# Patient Record
Sex: Male | Born: 1955 | Race: White | Hispanic: No | Marital: Married | Smoking: Never smoker
Health system: Southern US, Community
[De-identification: ages and names within clinical notes are randomized; demographics above are authoritative.]

## PROBLEM LIST (undated history)

## (undated) DIAGNOSIS — I2510 Atherosclerotic heart disease of native coronary artery without angina pectoris: Secondary | ICD-10-CM

## (undated) DIAGNOSIS — I10 Essential (primary) hypertension: Secondary | ICD-10-CM

## (undated) DIAGNOSIS — I201 Angina pectoris with documented spasm: Secondary | ICD-10-CM

## (undated) DIAGNOSIS — R42 Dizziness and giddiness: Secondary | ICD-10-CM

## (undated) DIAGNOSIS — E669 Obesity, unspecified: Secondary | ICD-10-CM

## (undated) DIAGNOSIS — G4733 Obstructive sleep apnea (adult) (pediatric): Secondary | ICD-10-CM

## (undated) DIAGNOSIS — J111 Influenza due to unidentified influenza virus with other respiratory manifestations: Secondary | ICD-10-CM

## (undated) DIAGNOSIS — E785 Hyperlipidemia, unspecified: Secondary | ICD-10-CM

## (undated) HISTORY — PX: NOSE SURGERY: SHX723

## (undated) HISTORY — DX: Influenza due to unidentified influenza virus with other respiratory manifestations: J11.1

## (undated) HISTORY — DX: Obesity, unspecified: E66.9

## (undated) HISTORY — DX: Obstructive sleep apnea (adult) (pediatric): G47.33

## (undated) HISTORY — PX: VASECTOMY: SHX75

## (undated) HISTORY — DX: Dizziness and giddiness: R42

## (undated) HISTORY — DX: Angina pectoris with documented spasm: I20.1

## (undated) HISTORY — DX: Hyperlipidemia, unspecified: E78.5

## (undated) HISTORY — DX: Atherosclerotic heart disease of native coronary artery without angina pectoris: I25.10

## (undated) HISTORY — DX: Essential (primary) hypertension: I10

---

## 1999-12-13 ENCOUNTER — Encounter (INDEPENDENT_AMBULATORY_CARE_PROVIDER_SITE_OTHER): Payer: Self-pay | Admitting: *Deleted

## 1999-12-13 ENCOUNTER — Ambulatory Visit (HOSPITAL_BASED_OUTPATIENT_CLINIC_OR_DEPARTMENT_OTHER): Admission: RE | Admit: 1999-12-13 | Discharge: 1999-12-14 | Payer: Self-pay | Admitting: Otolaryngology

## 2000-09-21 ENCOUNTER — Emergency Department (HOSPITAL_COMMUNITY): Admission: EM | Admit: 2000-09-21 | Discharge: 2000-09-22 | Payer: Self-pay | Admitting: Emergency Medicine

## 2000-09-21 ENCOUNTER — Encounter: Payer: Self-pay | Admitting: Emergency Medicine

## 2000-09-23 ENCOUNTER — Encounter: Payer: Self-pay | Admitting: Specialist

## 2000-09-23 ENCOUNTER — Ambulatory Visit (HOSPITAL_COMMUNITY): Admission: RE | Admit: 2000-09-23 | Discharge: 2000-09-23 | Payer: Self-pay | Admitting: Specialist

## 2001-07-02 HISTORY — PX: KNEE SURGERY: SHX244

## 2002-11-13 ENCOUNTER — Inpatient Hospital Stay (HOSPITAL_COMMUNITY): Admission: EM | Admit: 2002-11-13 | Discharge: 2002-11-16 | Payer: Self-pay | Admitting: Emergency Medicine

## 2002-11-13 ENCOUNTER — Encounter: Payer: Self-pay | Admitting: Cardiology

## 2002-11-13 HISTORY — PX: CORONARY ANGIOPLASTY WITH STENT PLACEMENT: SHX49

## 2002-12-02 ENCOUNTER — Ambulatory Visit (HOSPITAL_COMMUNITY): Admission: RE | Admit: 2002-12-02 | Discharge: 2002-12-02 | Payer: Self-pay | Admitting: Cardiology

## 2002-12-02 HISTORY — PX: CARDIAC CATHETERIZATION: SHX172

## 2002-12-14 ENCOUNTER — Encounter (HOSPITAL_COMMUNITY): Admission: RE | Admit: 2002-12-14 | Discharge: 2003-03-14 | Payer: Self-pay | Admitting: Cardiology

## 2003-03-15 ENCOUNTER — Encounter (HOSPITAL_COMMUNITY): Admission: RE | Admit: 2003-03-15 | Discharge: 2003-06-13 | Payer: Self-pay | Admitting: Cardiology

## 2003-06-16 ENCOUNTER — Ambulatory Visit (HOSPITAL_COMMUNITY): Admission: RE | Admit: 2003-06-16 | Discharge: 2003-06-16 | Payer: Self-pay | Admitting: Cardiology

## 2003-09-14 ENCOUNTER — Ambulatory Visit (HOSPITAL_COMMUNITY): Admission: RE | Admit: 2003-09-14 | Discharge: 2003-09-14 | Payer: Self-pay | Admitting: Cardiology

## 2004-03-28 ENCOUNTER — Encounter: Admission: RE | Admit: 2004-03-28 | Discharge: 2004-03-28 | Payer: Self-pay | Admitting: Internal Medicine

## 2004-06-15 ENCOUNTER — Ambulatory Visit (HOSPITAL_COMMUNITY): Admission: RE | Admit: 2004-06-15 | Discharge: 2004-06-15 | Payer: Self-pay | Admitting: Cardiology

## 2009-07-20 ENCOUNTER — Encounter (HOSPITAL_COMMUNITY): Admission: RE | Admit: 2009-07-20 | Discharge: 2009-10-07 | Payer: Self-pay | Admitting: Cardiology

## 2010-01-30 ENCOUNTER — Ambulatory Visit (HOSPITAL_COMMUNITY): Admission: RE | Admit: 2010-01-30 | Discharge: 2010-01-30 | Payer: Self-pay | Admitting: Cardiovascular Disease

## 2010-01-30 HISTORY — PX: CARDIAC CATHETERIZATION: SHX172

## 2010-03-21 ENCOUNTER — Encounter: Admission: RE | Admit: 2010-03-21 | Discharge: 2010-03-21 | Payer: Self-pay | Admitting: Internal Medicine

## 2010-07-22 ENCOUNTER — Encounter: Payer: Self-pay | Admitting: Cardiology

## 2010-11-03 ENCOUNTER — Ambulatory Visit (HOSPITAL_BASED_OUTPATIENT_CLINIC_OR_DEPARTMENT_OTHER)
Admission: RE | Admit: 2010-11-03 | Discharge: 2010-11-03 | Payer: BC Managed Care – PPO | Source: Ambulatory Visit | Attending: Specialist | Admitting: Specialist

## 2010-11-03 ENCOUNTER — Observation Stay (HOSPITAL_COMMUNITY)
Admission: AD | Admit: 2010-11-03 | Discharge: 2010-11-04 | DRG: 468 | Disposition: A | Discharge status: Home / Self Care | Payer: BC Managed Care – PPO | Source: Ambulatory Visit | Attending: Specialist | Admitting: Specialist

## 2010-11-03 DIAGNOSIS — M719 Bursopathy, unspecified: Secondary | ICD-10-CM | POA: Insufficient documentation

## 2010-11-03 DIAGNOSIS — Z7902 Long term (current) use of antithrombotics/antiplatelets: Secondary | ICD-10-CM | POA: Insufficient documentation

## 2010-11-03 DIAGNOSIS — I10 Essential (primary) hypertension: Secondary | ICD-10-CM | POA: Insufficient documentation

## 2010-11-03 DIAGNOSIS — M19019 Primary osteoarthritis, unspecified shoulder: Secondary | ICD-10-CM | POA: Diagnosis present

## 2010-11-03 DIAGNOSIS — I252 Old myocardial infarction: Secondary | ICD-10-CM

## 2010-11-03 DIAGNOSIS — Z79899 Other long term (current) drug therapy: Secondary | ICD-10-CM | POA: Insufficient documentation

## 2010-11-03 DIAGNOSIS — M25819 Other specified joint disorders, unspecified shoulder: Secondary | ICD-10-CM | POA: Diagnosis present

## 2010-11-03 DIAGNOSIS — M67919 Unspecified disorder of synovium and tendon, unspecified shoulder: Secondary | ICD-10-CM | POA: Diagnosis present

## 2010-11-03 DIAGNOSIS — I2510 Atherosclerotic heart disease of native coronary artery without angina pectoris: Secondary | ICD-10-CM | POA: Diagnosis present

## 2010-11-03 DIAGNOSIS — I200 Unstable angina: Principal | ICD-10-CM | POA: Diagnosis present

## 2010-11-03 DIAGNOSIS — X58XXXA Exposure to other specified factors, initial encounter: Secondary | ICD-10-CM | POA: Diagnosis present

## 2010-11-03 DIAGNOSIS — R9431 Abnormal electrocardiogram [ECG] [EKG]: Secondary | ICD-10-CM | POA: Insufficient documentation

## 2010-11-03 DIAGNOSIS — G4730 Sleep apnea, unspecified: Secondary | ICD-10-CM | POA: Diagnosis present

## 2010-11-03 DIAGNOSIS — E669 Obesity, unspecified: Secondary | ICD-10-CM | POA: Insufficient documentation

## 2010-11-03 DIAGNOSIS — M25519 Pain in unspecified shoulder: Secondary | ICD-10-CM | POA: Insufficient documentation

## 2010-11-03 DIAGNOSIS — E785 Hyperlipidemia, unspecified: Secondary | ICD-10-CM | POA: Diagnosis present

## 2010-11-03 DIAGNOSIS — S43429A Sprain of unspecified rotator cuff capsule, initial encounter: Secondary | ICD-10-CM | POA: Diagnosis present

## 2010-11-03 DIAGNOSIS — Z7982 Long term (current) use of aspirin: Secondary | ICD-10-CM | POA: Insufficient documentation

## 2010-11-03 LAB — POCT I-STAT 4, (NA,K, GLUC, HGB,HCT)
Glucose, Bld: 119 mg/dL — ABNORMAL HIGH (ref 70–99)
HCT: 44 % (ref 39.0–52.0)
Hemoglobin: 15 g/dL (ref 13.0–17.0)
Potassium: 3.9 mEq/L (ref 3.5–5.1)
Sodium: 141 mEq/L (ref 135–145)

## 2010-11-03 LAB — CARDIAC PANEL(CRET KIN+CKTOT+MB+TROPI)
CK, MB: 4.1 ng/mL — ABNORMAL HIGH (ref 0.3–4.0)
Relative Index: 1.8 (ref 0.0–2.5)
Troponin I: <0.3 ng/mL (ref <0.30)

## 2010-11-04 LAB — CARDIAC PANEL(CRET KIN+CKTOT+MB+TROPI)
CK, MB: 4.9 ng/mL — ABNORMAL HIGH (ref 0.3–4.0)
Relative Index: 1.4 (ref 0.0–2.5)
Total CK: 354 U/L — ABNORMAL HIGH (ref 7–232)
Troponin I: <0.3 ng/mL (ref <0.30)

## 2010-11-09 NOTE — Consult Note (Signed)
NAME:  Craig Copeland, Craig Copeland             ACCOUNT NO.:  [id number]  MEDICAL RECORD NO.:  [id number]           PATIENT TYPE:  I  LOCATION:  1424                         FACILITY:  [facility]  PHYSICIAN:  Thurmon Fair, MD     DATE OF BIRTH:  10-08-1955  DATE OF CONSULTATION:  11/03/2010 DATE OF DISCHARGE:                                CONSULTATION   CHIEF COMPLAINT:  Chest pain.  HISTORY OF PRESENT ILLNESS:  Craig Copeland is a 55 year old male who has been followed by Dr. Royann Shivers and Dr. Renne Crigler.  He has a history of documented coronary artery disease.  He had a previous inferior MI with overlapping Cypher stents in 2004.  He saw Dr. Royann Shivers as an outpatient for some chest pain in August 2011.  Catheterization was done which showed patency of the RCA site.  He did have some residual 60-70% narrowing and a small ramus intermedius branch.  Dr. Royann Shivers added amlodipine at that time and the patient has actually done quite wellfrom cardiac standpoint since.  He denies any chest pain.  He says he has not used nitroglycerin since he was put on amlodipine.  He is admitted now for elective left shoulder surgery.  Apparently in the recovery room he complained of some chest pain.  He said this was initially sharp and then developed into slight chest pressure.  He said it was "not bad."  EKG showed no acute changes.  He is now pain-free. We are seeing him in consult.  PAST MEDICAL HISTORY:  His past medical history is remarkable for treated hypertension.  He has treated dyslipidemia.  He has morbid obesity, he does have sleep apnea but is intolerant to CPAP.  He has had previous left knee surgery and rhinoplasty.  MEDICATIONS:  His home medications are aspirin 81 mg a day, amlodipine 5 mg q.h.s., Plavix 75 mg a day, fish oil daily, Crestor 10 mg a day, losartan 100 mg a day, Zyrtec 10 mg a day. He was on Toprol 25mg  but this was stopped, pt not sure who stopped it, or why.  ALLERGIES:  He has no known  drug allergies.  SOCIAL HISTORY:  He is married.  He has 2 children and 2 grandchildren. He works as a Product/process development scientist.  He has never smoked.  Drinks alcohol occasionally.  He does try and walk for exercise on a daily basis.  FAMILY HISTORY:  Unremarkable.  REVIEW OF SYSTEMS:  Essentially unremarkable except as noted above.  He does have chronic sinusitis.  PHYSICAL EXAMINATION:  VITAL SIGNS:  Blood pressure was 138/82, pulse 84, respirations 12. GENERAL:  In general, he is a well-developed obese male, in no acute distress. HEENT:  Normocephalic, atraumatic.  Extraocular movements are intact. Sclerae nonicteric. NECK:  Without JVD. CHEST:  Clear to auscultation and percussion.  He has a dressing on his left shoulder and his left arm is in a sling. LUNGS:  Clear to auscultation and percussion. CARDIAC EXAM:  Reveals regular rate and rhythm without obvious murmur, rub, or gallop. ABDOMEN:  Not distended, nontender. EXTREMITIES:  Revealed no distal edema with intact distal pulses. NEURO EXAM:  Grossly  intact.  He is awake, alert and oriented and cooperative and moves all extremities without obvious deficit. SKIN:  Cool and dry.  RADIOLOGIC DATA:  EKG shows sinus rhythm with inferior Qs that are old and no acute changes.  He does have first-degree AV block.  IMPRESSION: 1. Unstable angina in recovery room. 2. Known coronary artery disease with previous right coronary artery     overlapping Cypher stents in 2004, catheterization August 2011     showing essentially patent coronaries.  He did have a small ramus     branch with a 60-70% area of narrowing, this was treated medically. 3. Treated dyslipidemia. 4. Treated hypertension. 5. Morbid obesity. 6. Sleep apnea, intolerant to CPAP. 7. Status post left shoulder surgery.  PLAN:  We will go ahead and keep Craig Copeland on his home medications. He has not taken anything today but his Crestor.  Will have give his amlodipine now.   Will go ahead and check enzymes today and tomorrow.     Abelino Derrick, P.A.   ______________________________ Thurmon Fair, MD    LKK/MEDQ  D:  11/03/2010  T:  11/03/2010  Job:  [id number]  cc:   Thurmon Fair, MD Fax: [phone]  Soyla Murphy. Renne Crigler, M.D. Fax: [phone]  Electronically Signed by Corine Shelter P.A. on 11/03/2010 05:33:23 PM Electronically Signed by Thurmon Fair M.D. on 11/09/2010 04:09:30 PM

## 2010-11-17 NOTE — Op Note (Signed)
[place]  Patient:    Craig Copeland, Craig Copeland                      MRN: [id number] Proc. Date: 12/13/99 Adm. Date:  [id number] Disc. Date: [id number] Attending:  Susy Frizzle CC:         Soyla Murphy. Renne Crigler, M.D.             [phone]                           Operative Report  PREOPERATIVE DIAGNOSIS: 1. Septal deviation. 2. Inferior turbinate hypertrophy. 3. Tonsil hypertrophy. 4. Upper airway obstructive syndrome.  POSTOPERATIVE DIAGNOSIS: 1. Septal deviation. 2. Inferior turbinate hypertrophy. 3. Tonsil hypertrophy. 4. Upper airway obstructive syndrome.  OPERATION PERFORMED: 1. Tonsillectomy. 2. Nasal septoplasty. 3. Submucous resection of inferior turbinates.  SURGEON:  Jefry H. Pollyann Kennedy, M.D.  ASSISTANT:  ANESTHESIA:  General endotracheal.  COMPLICATIONS:  None.  ESTIMATED BLOOD LOSS:  40 cc.  FINDINGS:  Severe enlargement of the tonsils with thickening and redundancy of the anterior and posterior fossal arches.  Severe thickening and moderate deviation of the septum with severe bony hypertrophy of the inferior turbinates.  REFERRING PHYSICIAN:  Soyla Murphy. Renne Crigler, M.D.  INDICATIONS FOR PROCEDURE:  The patient is a 55 year old gentleman with a history of progressively worsening snoring and nasal obstruction.  He has failed medical treatment of the nasal obstruction for allergies.  The risks, benefits, alternatives and complications of the procedure were explained to the patient who seemed to understand and agreed to surgery.  DESCRIPTION OF PROCEDURE:  The patient was taken to the operating room and placed on the operating table in supine position.  Following induction of general endotracheal anesthesia, the table was turned 90 degrees and the patient was draped in standard fashion.  1 - Tonsillectomy.  A Crowe-Davis mouth gag was inserted into the oral cavity, used to retract the tongue and mandible and attached to the Mayo stand. A  red rubber catheter was inserted into the right side of the nose, withdrawn through  the mouth and used to retract the soft palate and uvula.  Tonsils were reflected medially with large forceps and electrocautery was used to dissect through mucosa and dissected a plane between the tonsillar capsules and the constrictor muscles.  Some of the excess mucosa of the anterior and posterior fossal arches were included with the tonsils.  Careful dissection bilaterally was performed.  Tonsils were retrieved and sent for pathologic evaluation.  Small bleeders were encountered along the dissection that were coagulated with the Bovie cautery and suction cautery as needed.  The mouth gag was then released and the nasal surgery was performed.  2 - Nasal septoplasty.  Oxymetazoline spray was used in the nose preoperatively.  1% Xylocaine with epinephrine was infiltrated into the septum, the columella and inferior turbinates.  Afrin soaked pledgets were placed in the nasal cavities bilaterally.  Left hemitransfixion incision was created with a 15 scalpel and Freer elevator was used to elevate mucosal flaps off the left side of the nasal septum from the cartilage and bony septum.  The bony cartilaginous junction was divided and flap was developed posteriorly down the right side.  The ethmoid, perpendicular plate and the vomerian bone were severely thickened up to about 4 mm in thickness in some areas.  Large fragments were resected.  There was a spur down along the left side inferiorly as  well.  There was generalized deflection of the posterior septum towards the left side.  The majority of the ethmoid plate and vomerian bone were resected using rongeurs and 4 mm osteotome as needed.  The cartilaginous septum was in good position and was not severely thickened.  The mucosal incision was reapproximated with interrupted 4-0 chromic suture and the mucosal flaps were reapproximated with a quilting suture of  4-0 plain gut.  3 - Submucous resection of inferior turbinates.  The leading edge of the inferior turbinates was incised with a 15 scalpel.  The mucosa was carefully elevated off of the turbinate bones laterally, medially and inferiorly.  Large fragments of also very thick bony overgrown turbinates were removed using Takahashi forceps.  The turbinate remnants were outfractured with an Alfreck retractor.  The nasal cavities were suctioned of blood and secretions and packed with rolled up Telfa gauze coated with bacitracin.  The pharynx was reinspected and there was no further bleeding identified.  The patient was then awakened, extubated and transferred to recovery in good condition. DD:  12/13/99 TD:  12/17/99 Job: [id number]

## 2010-11-17 NOTE — Discharge Summary (Signed)
NAME:  CHAU, SAWIN                       ACCOUNT NO.:  [id number]   MEDICAL RECORD NO.:  [id number]                   PATIENT TYPE:  INP   LOCATION:  3713                                 FACILITY:  [facility]   PHYSICIAN:  Cristy Hilts. Jacinto Halim, M.D.                  DATE OF BIRTH:  May 06, 1956   DATE OF ADMISSION:  11/13/2002  DATE OF DISCHARGE:  11/16/2002                                 DISCHARGE SUMMARY   ADMISSION DIAGNOSES:  1. Unstable angina.  2. Hypertension.  3. Questionable lipid profile.  4. History of knee replacement.  5. History of obstructive sleep apnea.  6. History of plantar fasciitis.   DISCHARGE DIAGNOSES:  1. Status post positive myocardial infarction, status post cardiac     catheterization on Nov 13, 2002, per Dr. Yates Decamp; he performed     percutaneous transluminal coronary angioplasty and stent with Cypher     stent to the mid right coronary artery.  Normal left ventricular     function.  Ejection fraction 55%.  2. Hypertension.  3. Dyslipidemia.  4. History of knee replacement.  5. History of obstructive sleep apnea.  6. History of plantar fasciitis.   HISTORY OF PRESENT ILLNESS:  Mr. Craig Copeland is a 55 year old white male who  presented to [facility] emergency room on Nov 13, 2002, with a chief  complaint of chest pain.  He had no prior cardiac history.  He does have a  history of hypertension and questionable lipid profile.  He has no diabetes  and no tobacco use.   On the previous Friday night, which was one week prior to his presentation,  he had been driving his car when he had the onset of left chest pain that  went to his left arm, and it was described as a tightness.  As well, his  left arm felt numb.  He became short of breath, possibly a little bit  diaphoretic, but had no nausea or vomiting.  His symptoms lasted about 10  minutes and resolved on their own.   On the Wednesday prior to admission, which was two days prior to admission,  he was  walking inside from his garage when he felt extremely fatigued and  weak.  His wife saw him and thought that he looked extremely pale, and he  seemed presyncopal.  The wife gave him one aspirin, and he rested.  His wife  called Dr. Carolee Rota office for the patient to be evaluated; he was evaluated  by Moshe Salisbury, N.P., who had planned to proceed with a stress test.  As  the stress test increased in intensity, he developed left chest pain but no  arm pain.  As well, he developed significant dyspnea on exertion and became  diaphoretic.  Again, he had no nausea or vomiting.   He was sent to the ER for further evaluation.  In the ER, he was having  no  symptoms.  He was having no chest pain, no shortness of breath, no  diaphoresis, no nausea or vomiting.  He had been given Toprol-XL 100 mg,  aspirin 81 mg times four, nitroglycerin spray times eight while at  [facility].  On exam in the ER, he was hemodynamically  stable.  EKG strips from his stress test were reviewed; after walking for 8  minutes, he had developed ST elevation in the inferior leads, and this had  been persistent for over 8 minutes into recovery, at which time he had  experienced chest pain and shortness of breath.  Given these findings, it  was felt that we needed to proceed with urgent cardiac catheterization.  EKG  done in the ER showed Q waves in leads II, III, and aVF and minimal ST  elevation.   The risks and benefits of the procedure were discussed with the patient and  his family, and we planned to proceed with the cardiac catheterization  urgently.   HOSPITAL COURSE:  On Nov 13, 2002, Mr. Laduca underwent cardiac  catheterization by Dr. Yates Decamp; see his dictated report for all findings.  He performed PTCA and stent with two Cypher stents to the proximal and mid  RCA.  EF was 55%.  The patient tolerated the procedure well and had no  complications.  We planned to continue aspirin and Plavix  indefinitely.  We  would start empiric statin therapy while we awaited lipid profile.  We would  use low-dose beta blocker and ACE inhibitor.  We planned to monitor him for  several days given his significant disease and probable MI.  At that point,  enzymes were pending.   On Nov 14, 2002, Mr. Kissoon remained stable.  He had no further chest pain  and was feeling well.  Integrilin was still infusing at the time of our  rounds.  He was stable with a blood pressure of 105/55, heart rate 75.  Right groin was stable without hematoma.  We planned to monitor him further  and planned for cardiac rehab.   On Nov 15, 2002, Mr. Yanes, again, remained stable with no further chest  pain, even with ambulation.  We continued to adjust medications and planned  for probable discharge in the morning if stable.   On Nov 16, 2002, he continued to do well with no chest pain and no shortness  of breath.  Heart rate 62, blood pressure 128/72.  He has maintained sinus  rhythm with no __________.  At this point, he was seen and evaluated by Dr.  Yates Decamp, who deemed him stable for discharge home.   HOSPITAL CONSULTS:  None.   HOSPITAL PROCEDURES:  Cardiac catheterization on Nov 13, 2002, per Dr. Yates Decamp; he performed successful PTCA and stenting of an occluded mid RCA.  He  used two Cypher stents to the mid and proximal segments, overlapping each  other.  The stenosis was reduced from 100% to 0%.  No evidence of dissection  or thrombus was in the procedure.  For other findings at cath, see the  report.  He had normal LV function.  He tolerated the procedure well without  complications.   LABORATORY DATA:  TSH normal at 2.096.  Lipid profile shows total  cholesterol 171, triglycerides 207, HDL 24, LDL 106.  Cardiac enzymes showed  CKs of 133, 156, 239, and 139.  CK-MBs were 3.8 to 9.8, 19.5, and 6.7. Troponin-I was 1.14, 2.37, and 1.99.  On admission, sodium 139, potassium  4.0, chloride 103,  glucose 102, BUN 18, creatinine 1.2; these all remained  stable.  At the time of discharge home, creatinine is 1 post cath.  On  admission, pro time 13.3, INR of 1, PTT 29.  On admission, white count 6.6,  hemoglobin 13.5, hematocrit 39.2, and platelets 195; these also remained  stable, and at discharge, white count 5.9, hemoglobin 13.1, hematocrit 39.6,  and platelets 174.   EKG in the ER shows sinus bradycardia with a first-degree AV block and Q  waves inferiorly.  Mild ST changes.   DISCHARGE MEDICATIONS:  1. Zocor 80 mg each day.  2. Plavix 75 mg once per day.  3. Avalide 300/12.5 mg once per day.  4. Protonix 40 mg once per day.  5. Toprol-XL 25 mg once per day.  6. Altace 2.5 mg once per day.  7. Niaspan 500 mg each night.  8. Aspirin 81 mg daily.   ACTIVITY:  No driving, strenuous activity, lifting over 5 pounds, or sexual  activity for three days.   DISCHARGE INSTRUCTIONS:  Call Dr. Aleen Campi if he has any bleeding or  increased pain in the groin site.   FOLLOW UP:  Mr. Oestreicher is to follow up with Dr. Aleen Campi in the next two to  three weeks.      Mary B. Remer Macho, P.A.-C.                   Cristy Hilts. Jacinto Halim, M.D.    MBE/MEDQ  D:  01/28/2003  T:  01/29/2003  Job:  [id number]   cc:   Cristy Hilts. Jacinto Halim, M.D.  [address]  [place]  [place] [address]  Fax: [phone]   Soyla Murphy. Renne Crigler, M.D.  [address]  [place]  [place] [address]  Fax: [phone]

## 2010-11-17 NOTE — Cardiovascular Report (Signed)
NAME:  Craig Copeland, Craig Copeland                       ACCOUNT NO.:  [id number]   MEDICAL RECORD NO.:  [id number]                   PATIENT TYPE:  OIB   LOCATION:  2855                                 FACILITY:  [facility]   PHYSICIAN:  John R. Tysinger, M.D.              DATE OF BIRTH:  May 27, 1956   DATE OF PROCEDURE:  12/02/2002  DATE OF DISCHARGE:                              CARDIAC CATHETERIZATION   REFERRING PHYSICIAN:  Jaclyn Prime. Lucas Mallow, M.D.   PROCEDURES:  1. Left heart catheterization.  2. Coronary cineangiography.  3. Left ventricular cineangiography.  4. Aortic root cineangiography.  5. Perclose of the right femoral artery.   INDICATIONS FOR PROCEDURES:  This 55 year old male has a recent history of  an emergency cardiac catheterization on Nov 13, 2002 after the onset of  atypical chest discomfort leading to a treadmill exercise tolerance test.  During the stress test he developed ST elevation and chest pain and was  taken to the [facility] for an emergency  catheterization where Jay R. Jacinto Halim, M.D. did an emergency angioplasty of his  right coronary artery with stent placement.  He now is complaining of very  similar discomfort in his chest which led to his stress test on May 14 and  after considering the recent events it was recommended that we repeat a  cardiac catheterization to look at the status of his right coronary artery.  Also noted in the prior catheterization was a 70% stenosis of an optional  diagonal branch and haziness in his proximal LAD.   PROCEDURE:  After signing an informed consent, the patient was premedicated  with 5 mg of Valium by mouth and brought to the Cardiac Catheterization  Laboratory.  His right groin was prepped and draped in a sterile fashion and  anesthetized locally with 1% lidocaine.  A 6-French introducer sheath was  inserted percutaneously into the right femoral artery.  A 6-French #4  Judkins coronary  catheters were used to make injections into the native  coronary arteries.  A 6-French pigtail catheter was used to measure  pressures in the left ventricle and aorta and to make a midstream injections  into the left ventricle and root of the aorta.  The root of the aorta  injection was made because of high flow in his coronary arteries which  suggested aortic insufficiency.  The patient tolerated the procedure well  and no complications were noted.  At the end of the procedure, the catheter  and sheath were removed from the right femoral artery and hemostasis was  easily obtained with a Perclose closure system.   MEDICATIONS GIVEN:  None.   HEMODYNAMIC DATA:  Left ventricular pressure was 130/4-14.  Aortic pressure  was 139/78 with a mean of 98.  Left ventricular ejection fraction 70%.   CINEANGIOGRAPHY FINDINGS:  CORONARY CINEANGIOGRAPHY:  Left coronary artery:  The ostium and left main appear normal.   The  left anterior descending:  Appears normal without evidence of haziness  seen on his prior study.   Optional diagonal branch has a minor lesion at its takeoff of approximately  30% and no evidence of a severe stenosis as previously seen on his prior  study.   Circumflex coronary artery:  Appears normal.   Right coronary artery:  The right coronary artery has a minor plaque at its  ostium of less than 20%.  There is a normal segment followed by the stented  segment in his proximal segment.  There are minor irregularities within the  stented segment all which are less than 20% restenotic.  There was a  definite stepdown from the normal proximal segment and the normal segment at  the acute angle.  There is normal antegrade flow, however.  The middle  segment at the acute angle and the right coronary system distally all appear  normal with normal runoff.   LEFT VENTRICULAR CINEANGIOGRAM:  The left ventricular chamber size appears  normal.  The left ventricular contractility now  appears normal without  evidence of the hypokinesia that was seen on his prior study.  The ejection  fraction now is in the 65-70% range.  The mitral valve appeared normal.  There was no evidence of insufficiency or calcification.   AORTIC ROOT CINEANGIOGRAPHY:  A mid stream injection into the aortic root  reveals a normal appearing aortic valve with three cusps and no evidence of  calcification or insufficiency.   FINAL DIAGNOSES:  1. Patent right coronary artery with good appearance of the stented area in     the proximal segment with only minor irregularity within the stent of     less than 20% restenosis and normal antegrade flow.  2. No haziness seen in the proximal left anterior descending.  3. Minor lesion at the takeoff of the optional diagonal branch less than     30%.  4. Normal left ventricular function with the inferior hypokinesia no longer     present.  5. Normal mitral and aortic valves.  No evidence of aortic insufficiency.  6. Successful Perclose of the right femoral artery.   DISPOSITION:  Will monitor on the short-stay unit prior to discharge when  stable.  We will continue the same medications, however, may need to  consider stopping the Niacin if his headaches continue.                                               John R. Aleen Campi, M.D.    JRT/MEDQ  D:  12/02/2002  T:  12/02/2002  Job:  [id number]   cc:   Jaclyn Prime. Lucas Mallow, M.D.  [address]  [place]  [place] [address]  Fax: [phone]   Cath Lab [facility]

## 2010-11-17 NOTE — Cardiovascular Report (Signed)
NAME:  Craig Copeland, Craig Copeland                       ACCOUNT NO.:  [id number]   MEDICAL RECORD NO.:  [id number]                   PATIENT TYPE:  INP   LOCATION:  1824                                 FACILITY:  [facility]   PHYSICIAN:  Cristy Hilts. Jacinto Halim, M.D.                  DATE OF BIRTH:  10-Jan-1956   DATE OF PROCEDURE:  11/13/2002  DATE OF DISCHARGE:                              CARDIAC CATHETERIZATION   PROCEDURES PERFORMED:  1. Left ventriculography.  2. Selective right and left coronary angiography.  3. Percutaneous transluminal coronary angioplasty and stenting of the     occluded right coronary artery.  4. Intracoronary nitroglycerin administration.  5. Intracoronary Integrelin administration.   CARDIOLOGIST:  Cristy Hilts. Jacinto Halim, M.D.   INDICATIONS FOR PROCEDURE:  The patient is a 55 year old white male with  history of hypertension and hyperlipidemia who was admitted directly to the  hospital with symptoms suggestive of unstable angina.  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  He had continued  chest pain with symptoms suggestive of unstable angina.  He was also on a  treadmill where he developed significant ST segment elevation in the  inferior leads.  Given this he was directly admitted through the emergency  room.  Cardiac catheterization is being performed to evaluate the coronary  anatomy.   HEMODYNAMIC DATA:  The left ventricular pressures were 105/6 with an end-  diastolic pressure of 18 mmHg.  The aortic pressure was 106/93 with a mean  of 87 mmHg.  There was no pressure gradient across the aortic valve.   ANGIOGRAPHIC DATA:  Right Coronary Artery:  The right coronary artery is a  large caliber vessel and is occluded in its proximal segment.  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  The distal right  coronary artery is occluded by contralateral  collaterals.   Left Main Coronary Artery:  The left main coronary artery is a large caliber  vessel.  It is short.  It is normal.   Circumflex Artery:  The circumflex artery is a large caliber vessel.  It  continues as a Journalist, newspaper.  It has mild disease.   Ramus Intermedius Artery:  The ramus intermedius is a small vessel and has a  proximal 70% stenosis.   Left Anterior Descending Artery:  The left anterior descending artery is a  large caliber vessel.  It gives origin to a moderate size diagonal-1 and  diagonal-2.  At the origin of the diagonal-2 there is an eccentric hazy 30%  stenosis.  The LAD wraps around the apex.  There is mild diffuse disease.   INTERVENTION DATA:  Successful PTCA and stenting of the occluded mid right  coronary artery. A 3.0 x 33 mm CYPHER and a 3.0 x 23 mm CYPHER were deployed  in  the mid-to-proximal segments, overlapping each other at 18 atmospheric  pressure.  The stenosis was reduced from 100% to 0% with TIMI 0 to TIMI III  flow at the end of the procedure.  Excellent results were noted with no  evidence of dissection nor evidence of thrombus at the end of the procedure.   RECOMMENDATIONS:  1. Aspirin and Plavix indefinitely.  2. The patient will be aggressively treated with lipid lowering therapy;     starting with Zocor 80 mg p.o. q.h.s.  3. Beta blockers and the ACE inhibitors that the patient is on already will     be continued; at a lower dose for now and titrated upwards.  4. Again, risk factor modification is indicated including weight loss and     exercise program.   TECHNIQUE OF THE PROCEDURE:  Diagnostic cardiac catheterization under the  usual sterile precautions using 6 French right femoral artery access. Six  [place] multipurpose B-2 catheter was advanced into the ascending aorta with  a 0.035 inch J wire.  The catheter was then advanced into the left ventricle  and the left ventricular pressure monitored.  Hand injections of  contrast  into the left ventricle was performed both in the LAO and RAO projections.  The catheter was flushed with saline, pulled back into the ascending aorta  and pressure gradient across the aortic valve was monitored.  The right  coronary artery was selectively engaged and angiography was performed.  In a  similar fashion the left main coronary artery was selectively engaged and  angiography was performed.  Then the catheter was pulled out of the body in  the usual fashion.   TECHNIQUE OF INTERVENTION:  A 7 [place] FR-4 guide was utilized to engage the  right coronary artery.  Intravenous heparin was administered and intravenous  Integrelin was used as an adjuvant anticoagulant.  After maintaining a  therapeutic ACT initially a 190 cm x 0.014 inch ATW guidewire was utilized  and the occluded right coronary artery was attempted to cross.  However,  because of inability to cross a 190 cm x 0.014 inch CROSS-IT 200 guide wire  was utilized.  The mid right coronary artery was crossed with difficulty.  The tip of the wire was carefully placed in the distal right coronary  arterial bed.  The intraluminal position of the wire was confirmed using a  2.0 x 20 mm OpenSail balloon, which was utilized to cross the occluded right  coronary artery and the guide wire was withdrawn out.  Then diluted contrast  was injected through the balloon central lumen.  Then the guide wire was re-  advanced through the central lumen of the angioplasty balloon and two  balloon angioplasties were performed anywhere from six atmospheric pressure  all the way up to 12 atmospheric pressure from the mid-to-proximal segments.  After performing multiple inflations there was still no significant  improvement in the flow.  The dissection flap was suspected in its mid  segment at the crux.  Hence, a 3.0 x 33 mm CYPHER was advanced over the  guide wire and after confirming the position of the stent, the stent was deployed at  18 atmospheric pressure for 60 seconds.  The balloon was  deflated and pulled back into the guiding catheter and angiography was  performed.  Excellent results were noted.  However, there was still severe  proximal stenosis.  This was stented with a 3.0 x 33 mm CYPHER overlapping  each other in the proximal  segment.  The stent was deployed at 18  atmospheric pressure for 60 seconds.  The balloon was deflated and pulled  back into the guiding catheter.  Intracoronary nitroglycerin was  administered and angiography was repeated.  Five mL of intracoronary  Integrelin was also administered to improve the flow.  Excellent results  were noted.  There was a small amount of thrombus noted in one of the small  secondary branch of the PLV branch.  However, the right coronary artery was  a very large vessel with excellent results.   After confirming the success in multiple views the guide wire was withdrawn  out of the body, and the guide was disengaged and pulled out of the body in  the usual fashion.   The patient tolerated the procedure well.                                                 Cristy Hilts. Jacinto Halim, M.D.    Pilar Plate  D:  11/13/2002  T:  11/14/2002  Job:  [id number]   cc:   Soyla Murphy. Renne Crigler, M.D.  [address]  [place]  [place] [address]  Fax: [phone]   [facility]

## 2010-11-29 NOTE — Op Note (Signed)
NAME:  Craig Copeland, Craig Copeland             ACCOUNT NO.:  [id number]  MEDICAL RECORD NO.:  [id number]           PATIENT TYPE:  I  LOCATION:  1424                         FACILITY:  [facility]  PHYSICIAN:  Erasmo Leventhal, M.D.DATE OF BIRTH:  Feb 08, 1956  DATE OF PROCEDURE:  11/03/2010 DATE OF DISCHARGE:                              OPERATIVE REPORT   PREOPERATIVE DIAGNOSIS:  Left shoulder impingement syndrome, possible cuff tear, acromioclavicular arthritis.  POSTOPERATIVE DIAGNOSIS:  Left shoulder impingement syndrome with partial bursal surface articular fraying rotator cuff, acromioclavicular arthritis.  PROCEDURE: 1. Left shoulder glenohumeral diagnostic arthroscopy. 2. Arthroscopic subacromial decompression, acromioplasty, bursectomy,     coracoacromial ligament release and debridement of partial cuff     fraying. 3. Arthroscopic distal clavicle resection, Mumford procedure.  SURGEON:  Erasmo Leventhal, M.D.  ASSISTANT:  Jamelle Rushing, P.A.  ANESTHESIA:  Interscalene block.  ESTIMATED BLOOD LOSS:  Less than 10 mL.  DRAINS:  None.  COMPLICATIONS:  None.  DISPOSITION:  PACU, stable.  OPERATIVE DETAILS:  The patient counseled in the holding area, correct site was identified, IV was started, antibiotics were given, block was administered.  Taken to operating room placed on the table in supine position under general anesthesia.  Gently turned to the right lateral decubitus position properly padded and bumped, left shoulder examined, full range of motion and stable.  Prepped with DuraPrep and draped in sterile fashion.  Overhead shoulder position was utilized to 30 degrees of abduction , increased full flexion, 20 pounds longitudinal traction __________ arm.  Posterior portal was created and arthroscope was placed to the glenohumeral joint.  Diagnostic arthroscopy revealed normal articular anatomy including the articular surfaces, the labrum, biceps, glenohumeral  ligament, synovium capsule, and rotator cuff.  Stability __________ The arthroscope was now placed in the subacromial region where a thick subacromial bursa was encountered.  Lateral portal was established.  Neurovascular structures and axillary nerve were protected.  The shaver was introduced and a subacromial bursectomy was performed.  Rotator cuff and bursal surface  showed fraying of the coracoacromial arch consistent with impingement, and rotator cuff was gently debrided back to healthy tissue with a shaver lightly. ArthroCare system was utilized to visualize the periosteum of CA ligament.  A bur was then placed posteriorly, and anteroinferior acromioplasty was performed, converting to a flat acromial morphology. 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  Two accessory entry portals were made for distal clavicle resection, utilizing the cautery, shaver and bur.  Lateral 5 to 8 mm of the clavicle was removed circumferentially.  The capsule was intact.  The cuff was palpated and found to be stable and arthroscope was removed. Hemostasis was obtained.  Arthroscopic equipment was removed, he was taken out of traction.  Portals closed with one suture; another 15 mL of 0.25% Marcaine with epinephrine  injected in the portal sites and subacromial repair. Sterile dressing applied.  Turned supine, placed in sling, awakened and taken back to PACU in a stable condition.  Sponge and needle counts correct.  No complications or problems, will be kept overnight for observation.  Proper surgical technique and  decision making, Mr. Oneida Alar, PA-C, assisted for the entire case.          ______________________________ Erasmo Leventhal, M.D.     RAC/MEDQ  D:  11/03/2010  T:  11/03/2010  Job:  [id number]  Electronically Signed by Eugenia Mcalpine M.D. on 11/29/2010 01:03:21 PM

## 2012-12-13 ENCOUNTER — Encounter: Payer: Self-pay | Admitting: *Deleted

## 2012-12-17 ENCOUNTER — Encounter: Payer: Self-pay | Admitting: Cardiovascular Disease

## 2012-12-18 ENCOUNTER — Ambulatory Visit (INDEPENDENT_AMBULATORY_CARE_PROVIDER_SITE_OTHER): Payer: BC Managed Care – PPO | Admitting: Cardiovascular Disease

## 2012-12-18 ENCOUNTER — Encounter: Payer: Self-pay | Admitting: Cardiovascular Disease

## 2012-12-18 ENCOUNTER — Ambulatory Visit: Payer: BC Managed Care – PPO | Admitting: Cardiovascular Disease

## 2012-12-18 VITALS — BP 130/64 | HR 74 | Resp 18 | Ht 72.0 in | Wt 309.3 lb

## 2012-12-18 DIAGNOSIS — I2510 Atherosclerotic heart disease of native coronary artery without angina pectoris: Secondary | ICD-10-CM

## 2012-12-18 DIAGNOSIS — I209 Angina pectoris, unspecified: Secondary | ICD-10-CM | POA: Insufficient documentation

## 2012-12-18 DIAGNOSIS — D689 Coagulation defect, unspecified: Secondary | ICD-10-CM

## 2012-12-18 DIAGNOSIS — I10 Essential (primary) hypertension: Secondary | ICD-10-CM

## 2012-12-18 DIAGNOSIS — Z79899 Other long term (current) drug therapy: Secondary | ICD-10-CM

## 2012-12-18 DIAGNOSIS — I200 Unstable angina: Secondary | ICD-10-CM

## 2012-12-18 DIAGNOSIS — R079 Chest pain, unspecified: Secondary | ICD-10-CM

## 2012-12-18 DIAGNOSIS — E785 Hyperlipidemia, unspecified: Secondary | ICD-10-CM

## 2012-12-18 DIAGNOSIS — G4733 Obstructive sleep apnea (adult) (pediatric): Secondary | ICD-10-CM

## 2012-12-18 DIAGNOSIS — R5381 Other malaise: Secondary | ICD-10-CM

## 2012-12-18 LAB — COMPREHENSIVE METABOLIC PANEL
ALT: 44 U/L (ref 0–53)
Albumin: 4.2 g/dL (ref 3.5–5.2)
Alkaline Phosphatase: 51 U/L (ref 39–117)
CO2: 24 mEq/L (ref 19–32)
Calcium: 9.3 mg/dL (ref 8.4–10.5)
Chloride: 106 mEq/L (ref 96–112)
Creat: 0.9 mg/dL (ref 0.50–1.35)
Glucose, Bld: 104 mg/dL — ABNORMAL HIGH (ref 70–99)
Potassium: 4.3 mEq/L (ref 3.5–5.3)
Sodium: 139 mEq/L (ref 135–145)
Total Bilirubin: 0.8 mg/dL (ref 0.3–1.2)
Total Protein: 7.2 g/dL (ref 6.0–8.3)

## 2012-12-18 LAB — CBC
HCT: 48 % (ref 39.0–52.0)
Hemoglobin: 16.3 g/dL (ref 13.0–17.0)
MCH: 28.7 pg (ref 26.0–34.0)
MCHC: 34 g/dL (ref 30.0–36.0)
MCV: 84.7 fL (ref 78.0–100.0)
Platelets: 197 10*3/uL (ref 150–400)
RBC: 5.67 MIL/uL (ref 4.22–5.81)
RDW: 14.4 % (ref 11.5–15.5)
WBC: 6.2 10*3/uL (ref 4.0–10.5)

## 2012-12-18 LAB — PROTIME-INR: INR: 0.95 (ref <1.50)

## 2012-12-18 LAB — APTT: aPTT: 35 seconds (ref 24–37)

## 2012-12-18 NOTE — Assessment & Plan Note (Addendum)
Recent onset angina pectoris with activity, after two years free of symptoms. Discussed options of noninvasive evaluation with nuclear stress testing versus coronary angiography. The symptoms are very typical for his previous presentation with coronary disease and are activity limiting despite the fact that he is already on some antianginal therapy.The presentation is not consistent with vasospastic angina. All his episodes occur with physical activity to In addition I am worried that a nuclear perfusion scan would be difficult to interpret due to diaphragmatic attenuation artifact in this large gentleman. All in all I think he would be best served by proceeding directly to invasive evaluation and if necessary percutaneous catheterization and stenting.  The risks and benefits of both diagnostic test and possible revascularization were discussed in detail he understands and agrees to proceed. I think he is well suited for radial artery approach.

## 2012-12-18 NOTE — Assessment & Plan Note (Signed)
Spike previous difficulties with attempts at weight loss this remains a critical long term goal in order to avoid future vascular problems

## 2012-12-18 NOTE — Assessment & Plan Note (Signed)
Previous problems with CPAP related to profuse nasal secretions

## 2012-12-18 NOTE — Progress Notes (Signed)
Patient ID: Craig Copeland, male   DOB: Nov 07, 1955, 57 y.o.   MRN: [id number]      Reason for office visit Recurrent exertional angina  Craig Copeland is now 57 years old and this is his first office appointment in the last couple of years. He received stents to the proximal to mid right coronary artery in 2004. Repeat angiography in 2011 showed the stents to be patent. He had symptoms suggestive of vasospastic angina at the time and indeed improved considerably with amlodipine therapy. For the next 2 years she did really well and has not required any sublingual nitroglycerin or additional evaluation for coronary problems.   Just over the last month he has noticed recurrent problems with chest tightness that occur when he is working. 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 The symptoms resolve with rest but he subsequently feels "wiped out" for the rest of the day. He has not had any symptoms with onset at rest.    Allergies  Allergen Reactions  . Ace Inhibitors Cough    Current Outpatient Prescriptions  Medication Sig Dispense Refill  . amLODipine (NORVASC) 5 MG tablet Take 5 mg by mouth daily. Take 1 1/2 tablets daily      . aspirin 81 MG tablet Take 81 mg by mouth daily.      . clopidogrel (PLAVIX) 75 MG tablet Take 75 mg by mouth daily.      . fish oil-omega-3 fatty acids 1000 MG capsule Take 2 g by mouth 2 (two) times daily.      [name] losartan (COZAAR) 100 MG tablet Take 100 mg by mouth daily.      . montelukast (SINGULAIR) 10 MG tablet Take 10 mg by mouth daily.      . nitroGLYCERIN (NITROSTAT) 0.4 MG SL tablet Place 0.4 mg under the tongue every 5 (five) minutes as needed for chest pain.      . rosuvastatin (CRESTOR) 10 MG tablet Take 10 mg by mouth daily.      [name] testosterone cypionate (DEPOTESTOTERONE CYPIONATE) 200 MG/ML injection Inject 200 mg into the muscle every 14 (fourteen) days.      . cetirizine  (ZYRTEC) 10 MG tablet Take 10 mg by mouth daily.      [name] ipratropium (ATROVENT) 0.03 % nasal spray Place 2 sprays into the nose at bedtime.       No current facility-administered medications for this visit.    Past Medical History  Diagnosis Date  . CAD (coronary artery disease)   . Vasospastic angina   . Systemic hypertension   . Dizziness   . Dyslipidemia   . OSA (obstructive sleep apnea)   . Obesity     Past Surgical History  Procedure Laterality Date  . Coronary angioplasty with stent placement  11/13/2002    mid RCA  . Cardiac catheterization  12/02/2002    <20% restenosis RCA  . Cardiac catheterization  01/30/2010    20-30% in-stent restenosis,60-70% ostial stenosis  . Knee surgery    . Nose surgery      Family History  Problem Relation Age of Onset  . Heart failure Father   . Hypertension Father     History   Social History  . Marital Status: Married    Spouse Name: N/A    Number of Children: N/A  . Years of Education: N/A   Occupational History  . Not on file.   Social History Main  Topics  . Smoking status: Never Smoker   . Smokeless tobacco: Not on file  . Alcohol Use: 0.5 oz/week    1 drink(s) per week  . Drug Use: No  . Sexually Active: Not on file   Other Topics Concern  . Not on file   Social History Narrative  . No narrative on file    Review of systems: The patient specifically denies any chest pain at rest, dyspnea at rest, orthopnea, paroxysmal nocturnal dyspnea, syncope, palpitations, focal neurological deficits, intermittent claudication, lower extremity edema, unexplained weight gain, cough, hemoptysis or wheezing.  The patient also denies abdominal pain, nausea, vomiting, dysphagia, diarrhea, constipation, polyuria, polydipsia, dysuria, hematuria, frequency, urgency, abnormal bleeding or bruising, fever, chills, unexpected weight changes, mood swings, change in skin or hair texture, change in voice quality, auditory or visual problems,  allergic reactions or rashes, new musculoskeletal complaints other than usual "aches and pains".   PHYSICAL EXAM BP 130/64  Pulse 74  Resp 18  Ht 6' (1.829 m)  Wt 140.298 kg (309 lb 4.8 oz)  BMI 41.94 kg/m2  General: Alert, oriented x3, no distress, morbidly obese Head: no evidence of trauma, PERRL, EOMI, no exophtalmos or lid lag, no myxedema, no xanthelasma; normal ears, nose and oropharynx Neck: normal jugular venous pulsations and no hepatojugular reflux; brisk carotid pulses without delay and no carotid bruits Chest: clear to auscultation, no signs of consolidation by percussion or palpation, normal fremitus, symmetrical and full respiratory excursions Cardiovascular: normal position and quality of the apical impulse, regular rhythm, normal first and second heart sounds, no murmurs, rubs or gallops Abdomen: no tenderness or distention, no masses by palpation, no abnormal pulsatility or arterial bruits, normal bowel sounds, no hepatosplenomegaly Extremities: no clubbing, cyanosis or edema; 2+ radial, ulnar and brachial pulses bilaterally; 2+ right femoral, posterior tibial and dorsalis pedis pulses; 2+ left femoral, posterior tibial and dorsalis pedis pulses; no subclavian or femoral bruits; favorable Allen's test the right wrist Neurological: grossly nonfocal   EKG:  normal sinus rhythm Q waves in leads II, III, and F with associated inverted T waves in leads 3 and F. the repolarization abnormalities are not seen on his last echo electrocardiogram from 2012   Lipid Panel  No results found for this basename: chol, trig, hdl, cholhdl, vldl, ldlcalc    BMET    Component Value Date/Time   NA 141 11/03/2010 0642   K 3.9 11/03/2010 0642   GLUCOSE 119* 11/03/2010 0642     ASSESSMENT AND PLAN Angina pectoris, unstable Recent onset angina pectoris with activity, after two years free of symptoms. Discussed options of noninvasive evaluation with nuclear stress testing versus coronary  angiography. The symptoms are very typical for his previous presentation with coronary disease and are activity limiting despite the fact that he is already on some antianginal therapy.The presentation is not consistent with vasospastic angina. All his episodes occur with physical activity to In addition I am worried that a nuclear perfusion scan would be difficult to interpret due to diaphragmatic attenuation artifact in this large gentleman. All in all I think he would be best served by proceeding directly to invasive evaluation and if necessary percutaneous catheterization and stenting.  The risks and benefits of both diagnostic test and possible revascularization were discussed in detail he understands and agrees to proceed. I think he is well suited for radial artery approach.  Morbid obesity Spike previous difficulties with attempts at weight loss this remains a critical long term goal in order to  avoid future vascular problems  Obstructive sleep apnea Previous problems with CPAP related to profuse nasal secretions  Orders Placed This Encounter  Procedures  . Comp Met (CMET)  . CBC  . INR/PT  . PTT  . EKG 12-Lead  . LEFT HEART CATHETERIZATION WITH CORONARY ANGIOGRAM   Meds ordered this encounter  Medications  . montelukast (SINGULAIR) 10 MG tablet    Sig: Take 10 mg by mouth daily.  [name] testosterone cypionate (DEPOTESTOTERONE CYPIONATE) 200 MG/ML injection    Sig: Inject 200 mg into the muscle every 14 (fourteen) days.    Junious Silk, MD, [facility] [phone] office [phone] pager

## 2012-12-18 NOTE — Patient Instructions (Addendum)
Your physician has requested that you have a cardiac catheterization Monday 12/22/12. Cardiac catheterization is used to diagnose and/or treat various heart conditions. Doctors may recommend this procedure for a number of different reasons. The most common reason is to evaluate chest pain. Chest pain can be a symptom of coronary artery disease (CAD), and cardiac catheterization can show whether plaque is narrowing or blocking your heart's arteries. This procedure is also used to evaluate the valves, as well as measure the blood flow and oxygen levels in different parts of your heart. For further information please visit [email or web address] Please follow instruction sheet, as given.  Have labs drawn today at [facility] on the first floor.

## 2012-12-19 ENCOUNTER — Encounter (HOSPITAL_COMMUNITY): Payer: Self-pay | Admitting: Pharmacy Technician

## 2012-12-19 ENCOUNTER — Other Ambulatory Visit: Payer: Self-pay | Admitting: *Deleted

## 2012-12-19 DIAGNOSIS — I200 Unstable angina: Secondary | ICD-10-CM

## 2012-12-22 ENCOUNTER — Ambulatory Visit (HOSPITAL_COMMUNITY)
Admission: RE | Admit: 2012-12-22 | Discharge: 2012-12-22 | Disposition: A | Discharge status: Home / Self Care | Payer: BC Managed Care – PPO | Source: Ambulatory Visit | Attending: Cardiovascular Disease | Admitting: Cardiovascular Disease

## 2012-12-22 ENCOUNTER — Encounter (HOSPITAL_COMMUNITY)
Admission: RE | Disposition: A | Discharge status: Home / Self Care | Payer: Self-pay | Source: Ambulatory Visit | Attending: Cardiovascular Disease

## 2012-12-22 ENCOUNTER — Other Ambulatory Visit: Payer: Self-pay | Admitting: Cardiovascular Disease

## 2012-12-22 DIAGNOSIS — Z9861 Coronary angioplasty status: Secondary | ICD-10-CM | POA: Insufficient documentation

## 2012-12-22 DIAGNOSIS — I10 Essential (primary) hypertension: Secondary | ICD-10-CM | POA: Insufficient documentation

## 2012-12-22 DIAGNOSIS — Z8249 Family history of ischemic heart disease and other diseases of the circulatory system: Secondary | ICD-10-CM | POA: Insufficient documentation

## 2012-12-22 DIAGNOSIS — I2510 Atherosclerotic heart disease of native coronary artery without angina pectoris: Secondary | ICD-10-CM

## 2012-12-22 DIAGNOSIS — I200 Unstable angina: Secondary | ICD-10-CM

## 2012-12-22 DIAGNOSIS — E785 Hyperlipidemia, unspecified: Secondary | ICD-10-CM | POA: Insufficient documentation

## 2012-12-22 DIAGNOSIS — Z79899 Other long term (current) drug therapy: Secondary | ICD-10-CM | POA: Insufficient documentation

## 2012-12-22 DIAGNOSIS — R0789 Other chest pain: Secondary | ICD-10-CM | POA: Insufficient documentation

## 2012-12-22 DIAGNOSIS — G4733 Obstructive sleep apnea (adult) (pediatric): Secondary | ICD-10-CM | POA: Insufficient documentation

## 2012-12-22 DIAGNOSIS — Z6841 Body Mass Index (BMI) 40.0 and over, adult: Secondary | ICD-10-CM | POA: Insufficient documentation

## 2012-12-22 DIAGNOSIS — R079 Chest pain, unspecified: Secondary | ICD-10-CM

## 2012-12-22 HISTORY — PX: LEFT HEART CATHETERIZATION WITH CORONARY ANGIOGRAM: SHX5451

## 2012-12-22 SURGERY — LEFT HEART CATHETERIZATION WITH CORONARY ANGIOGRAM
Anesthesia: LOCAL

## 2012-12-22 MED ORDER — SODIUM CHLORIDE 0.9 % IV SOLN: INTRAVENOUS | Status: DC

## 2012-12-22 MED ORDER — SODIUM CHLORIDE 0.9 % IV SOLN: 1.0000 mL/kg/h | INTRAVENOUS | Status: DC

## 2012-12-22 MED ORDER — SODIUM CHLORIDE 0.9 % IJ SOLN: 3.0000 mL | INTRAMUSCULAR | Status: DC | PRN

## 2012-12-22 MED ORDER — NITROGLYCERIN 0.2 MG/ML ON CALL CATH LAB
INTRAVENOUS | Status: AC
  Filled 2012-12-22: qty 1

## 2012-12-22 MED ORDER — HEPARIN (PORCINE) IN NACL 2-0.9 UNIT/ML-% IJ SOLN
INTRAMUSCULAR | Status: AC
  Filled 2012-12-22: qty 1000

## 2012-12-22 MED ORDER — ONDANSETRON HCL 4 MG/2ML IJ SOLN: 4.0000 mg | Freq: Four times a day (QID) | INTRAMUSCULAR | Status: DC | PRN

## 2012-12-22 MED ORDER — AMLODIPINE BESYLATE 5 MG PO TABS: 10.0000 mg | ORAL_TABLET | Freq: Every day | ORAL | Status: DC

## 2012-12-22 MED ORDER — HEPARIN SODIUM (PORCINE) 1000 UNIT/ML IJ SOLN
INTRAMUSCULAR | Status: AC
  Filled 2012-12-22: qty 1

## 2012-12-22 MED ORDER — LIDOCAINE HCL (PF) 1 % IJ SOLN
INTRAMUSCULAR | Status: AC
  Filled 2012-12-22: qty 30

## 2012-12-22 MED ORDER — VERAPAMIL HCL 2.5 MG/ML IV SOLN
INTRAVENOUS | Status: AC
  Filled 2012-12-22: qty 2

## 2012-12-22 MED ORDER — ACETAMINOPHEN 325 MG PO TABS: 650.0000 mg | ORAL_TABLET | ORAL | Status: DC | PRN

## 2012-12-22 MED ORDER — LOSARTAN POTASSIUM 100 MG PO TABS: 50.0000 mg | ORAL_TABLET | Freq: Every day | ORAL | Status: DC

## 2012-12-22 NOTE — Op Note (Signed)
CARDIAC CATHETERIZATION REPORT   Procedures performed:  1. Left heart catheterization  2. Selective coronary angiography  3. Left ventriculography   Reason for procedure:  Accelerated angina pectoris CAD status post previous stents the right coronary artery  Procedure performed by: Thurmon Fair, MD, [facility]  Complications: none   Estimated blood loss: less than 5 mL   History:  Craig Copeland is now 57 years old and this is his first office appointment in the last couple of years. He received stents to the proximal to mid right coronary artery in 2004. Repeat angiography in 2011 showed the stents to be patent. He had symptoms suggestive of vasospastic angina at the time and indeed improved considerably with amlodipine therapy. For the next 2 years she did really well and has not required any sublingual nitroglycerin or additional evaluation for coronary problems.  Just over the last month he has noticed recurrent problems with chest tightness that occur when he is working. 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 The symptoms resolve with rest but he subsequently feels "wiped out" for the rest of the day. He has not had any symptoms with onset at rest.   Consent: The risks, benefits, and details of the procedure were explained to the patient. Risks including death, MI, stroke, bleeding, limb ischemia, renal failure and allergy were described and accepted by the patient. Informed written consent was obtained prior to proceeding.  Technique: The patient was brought to the cardiac catheterization laboratory in the fasting state. He was prepped and draped in the usual sterile fashion. Local anesthesia with 1% lidocaine was administered to the right wrist area. Using the modified Seldinger technique a 5 French right radial artery glide sheath was introduced without difficulty. Under fluoroscopic guidance, using 5 French TIG and  angled pigtail catheters, selective cannulation of the left coronary artery, right coronary artery and left ventricle were respectively performed. Several coronary angiograms in a variety of projections were recorded, as well as a left ventriculogram in the RAO projection. Left ventricular pressure and a pull back to the aorta were recorded. No immediate complications occurred. At the end of the procedure, all catheters were removed. After the procedure, hemostasis will be achieved with manual pressure.  Contrast used: 100 mL Omnipaque  Angiographic Findings:  1. The left main coronary artery is free of significant atherosclerosis and bifurcates in the usual fashion into the left anterior descending artery and left circumflex coronary artery. There is also a small ramus intermedius/a very high diagonal artery that has an approximately 80-90% ostial lesion. 2. The left anterior descending artery is a large vessel that reaches the apex and generates three major diagonal branches. There is evidence of mild luminal irregularities and no calcification. A 40% smooth lesion is seen in the mid to distal LAD artery, possibly in an area of intramyocardial course. No hemodynamically meaningful stenoses are seen. 3. The left circumflex coronary artery is a medium-size vessel non- dominant vessel that generates only one major oblique marginal artery. There is evidence of minimal luminal irregularities and no calcification. No hemodynamically meaningful stenoses are seen. 4. The right coronary artery is a very large-size dominant vessel that generates a branch posterior lateral ventricular system as well as the posterior descending artery. There is evidence of previously placed stents in a long segment of the proximal to mid vessel, with diffuse in-stent restenosis with a maximum severity of roughly 30-40%. No flow-limiting lesions are seen. Throughout  the remainder of the vessel there are very mild luminal irregularities  and no calcification. No hemodynamically meaningful stenoses are seen.  5. The left ventricle is normal in size. The left ventricle systolic function is norma with an estimated ejection fraction of 60 %. Regional wall motion abnormalities are not seen. No left ventricular thrombus is seen. There is no mitral insufficiency. The ascending aorta appears normal. There is no aortic valve stenosis by pullback. The left ventricular end-diastolic pressure is 12 mm Hg.    IMPRESSIONS:  Mild coronary atherosclerosis, widely patent stents in the right coronary artery without any evidence of progression of disease when compared with cardiac catheterization from 2011. The only flow-limiting stenosis is an ostial lesion and a small ramus intermedius artery that does not appear to be an important artery and is not amenable to percutaneous revascularization due to its small caliber. RECOMMENDATION:  Increase calcium channel blocker. Note he was intolerant of nitrates due to headaches. Decrease angiotensin receptor blocker to prevent hypotension. Reevaluate in a few weeks.     Thurmon Fair, MD, [facility] [phone] office [phone] pager

## 2012-12-22 NOTE — H&P (Signed)
Date of Initial H&P: 12/18/2012   History reviewed, patient examined, no change in status, stable for surgery. He had stents to the proximal-mid RCA in 2004, angina symptoms in 2011 resolved with calcium channel blocker therapy and cath showed no stenoses. Now with recurrent, accelerated exertional angina. Here for diagnostic cath +/-PCI.  This procedure has been fully reviewed with the patient and written informed consent has been obtained. Thurmon Fair, MD, [facility] [phone] office [phone] pager

## 2012-12-26 ENCOUNTER — Ambulatory Visit (HOSPITAL_COMMUNITY)
Admission: RE | Admit: 2012-12-26 | Discharge: 2012-12-26 | Disposition: A | Discharge status: Home / Self Care | Payer: BC Managed Care – PPO | Source: Ambulatory Visit | Attending: Cardiovascular Disease | Admitting: Cardiovascular Disease

## 2012-12-26 DIAGNOSIS — E785 Hyperlipidemia, unspecified: Secondary | ICD-10-CM | POA: Insufficient documentation

## 2012-12-26 DIAGNOSIS — G4733 Obstructive sleep apnea (adult) (pediatric): Secondary | ICD-10-CM | POA: Insufficient documentation

## 2012-12-26 DIAGNOSIS — Z6841 Body Mass Index (BMI) 40.0 and over, adult: Secondary | ICD-10-CM | POA: Insufficient documentation

## 2012-12-26 DIAGNOSIS — I2510 Atherosclerotic heart disease of native coronary artery without angina pectoris: Secondary | ICD-10-CM | POA: Insufficient documentation

## 2012-12-26 DIAGNOSIS — R079 Chest pain, unspecified: Secondary | ICD-10-CM

## 2012-12-26 DIAGNOSIS — I10 Essential (primary) hypertension: Secondary | ICD-10-CM | POA: Insufficient documentation

## 2012-12-26 NOTE — Progress Notes (Signed)
[facility]   2D echo completed 12/26/2012.   Cindy Enoch Moffa, RDCS  

## 2012-12-29 ENCOUNTER — Telehealth: Payer: Self-pay | Admitting: Cardiovascular Disease

## 2012-12-29 NOTE — Telephone Encounter (Signed)
Per Franky Macho have patient come in sometime this week to evaluate blood pressure. appt will be scheduled

## 2012-12-29 NOTE — Telephone Encounter (Signed)
Please call having side effects from the medicine!

## 2012-12-29 NOTE — Telephone Encounter (Signed)
appt w/extender tomorrow or wednesday

## 2012-12-29 NOTE — Telephone Encounter (Signed)
Per wife - patient has felt like a "Cherre Huger truck has run over him".  BP running 108/55 to 123/68 HR: 64. Also concerned he is seeing blood w/blowing his nose forcefully. No nose bleeds. Told to blow more gently.. Med changes on discharge: amlodipine increased to 10mg  QD, losartan decreasedd to 50mg  QD. Will review w/extender and call her back.

## 2012-12-30 ENCOUNTER — Ambulatory Visit (INDEPENDENT_AMBULATORY_CARE_PROVIDER_SITE_OTHER): Payer: BC Managed Care – PPO | Admitting: Physician Assistant

## 2012-12-30 ENCOUNTER — Encounter: Payer: Self-pay | Admitting: Physician Assistant

## 2012-12-30 VITALS — BP 132/72 | HR 79 | Ht 72.0 in | Wt 309.3 lb

## 2012-12-30 DIAGNOSIS — R079 Chest pain, unspecified: Secondary | ICD-10-CM

## 2012-12-30 DIAGNOSIS — E785 Hyperlipidemia, unspecified: Secondary | ICD-10-CM

## 2012-12-30 DIAGNOSIS — I2510 Atherosclerotic heart disease of native coronary artery without angina pectoris: Secondary | ICD-10-CM

## 2012-12-30 DIAGNOSIS — I10 Essential (primary) hypertension: Secondary | ICD-10-CM

## 2012-12-30 DIAGNOSIS — G4733 Obstructive sleep apnea (adult) (pediatric): Secondary | ICD-10-CM

## 2012-12-30 NOTE — Progress Notes (Signed)
Date:  12/30/2012   ID:  Craig Copeland, DOB 10-04-1955, MRN [id number]  PCP:  [name], MD  Primary Cardiologist:      History of Present Illness: Craig Copeland is a 57 y.o. male with a history of stent to the proximal to mid right coronary artery in 2004. Repeat angiography in 2011 showed the stents to be patent. He had symptoms suggestive of vasospastic angina at the time and indeed improved considerably with amlodipine therapy. For the next 2 years she did really well and has not required any sublingual nitroglycerin or additional evaluation for coronary problems.   Patient recently noticed recurrent problems with chest tightness which occurred with working. He was scheduled for left heart catheterization which revealed mild coronary atherosclerosis widely patent stents in the right coronary artery without any evidence of progression of disease when compared to prior cath in 2011. Was flow-limiting stenosis in the ostial lesion a small  ramus intermediate artery is not amenable to PCI to 2 small-caliber.   Patient presents today in followup to his heart catheterization. He notes some chest soreness which he describes as different from a prior anginal pain. He also reports he has not been exerting himself due to limitations in his right arm but plans on the return back to essentially full activity tomorrow.  The patient currently denies nausea, vomiting, fever,shortness of breath, orthopnea, dizziness, PND, cough, congestion, abdominal pain, hematochezia, melena, lower extremity edema.  Wt Readings from Last 3 Encounters:  12/30/12 309 lb 4.8 oz (140.298 kg)  12/22/12 298 lb (135.172 kg)  12/22/12 298 lb (135.172 kg)     Past Medical History  Diagnosis Date  . CAD (coronary artery disease)   . Vasospastic angina   . Systemic hypertension   . Dizziness   . Dyslipidemia   . OSA (obstructive sleep apnea)   . Obesity     Current Outpatient Prescriptions    Medication Sig Dispense Refill  . amLODipine (NORVASC) 5 MG tablet Take 2 tablets (10 mg total) by mouth daily.  90 tablet  3  . aspirin 81 MG tablet Take 81 mg by mouth daily.      . clopidogrel (PLAVIX) 75 MG tablet Take 75 mg by mouth daily.      [name] co-enzyme Q-10 30 MG capsule Take 30 mg by mouth daily.      . fish oil-omega-3 fatty acids 1000 MG capsule Take 2 g by mouth daily.       [name] losartan (COZAAR) 100 MG tablet Take 0.5 tablets (50 mg total) by mouth daily.  90 tablet  3  . montelukast (SINGULAIR) 10 MG tablet Take 10 mg by mouth daily.      . nitroGLYCERIN (NITROSTAT) 0.4 MG SL tablet Place 0.4 mg under the tongue every 5 (five) minutes as needed for chest pain.      . rosuvastatin (CRESTOR) 20 MG tablet Take 10 mg by mouth daily.       [name] testosterone cypionate (DEPOTESTOTERONE CYPIONATE) 200 MG/ML injection Inject 200 mg into the muscle every 14 (fourteen) days.       No current facility-administered medications for this visit.    Allergies:    Allergies  Allergen Reactions  . Ace Inhibitors Cough    Social History:  The patient  reports that he has never smoked. He does not have any smokeless tobacco history on file. He reports that he drinks about 0.5 ounces of alcohol per week. He reports that he does  not use illicit drugs.   Family history:   Family History  Problem Relation Age of Onset  . Heart failure Father   . Hypertension Father     ROS:  Please see the history of present illness.  All other systems reviewed and negative.   PHYSICAL EXAM: VS:  BP 132/72  Pulse 79  Ht 6' (1.829 m)  Wt 309 lb 4.8 oz (140.298 kg)  BMI 41.94 kg/m2 Obese, well developed, in no acute distress HEENT: Pupils are equal round react to light accommodation extraocular movements are intact.  Neck: no JVDNo cervical lymphadenopathy. Cardiac: Regular rate and rhythm without murmurs rubs or gallops. Lungs:  clear to auscultation bilaterally, no wheezing, rhonchi or rales Abd: soft,  nontender, positive bowel sounds all quadrants, no hepatosplenomegaly Ext: no lower extremity edema.  2+ radial and dorsalis pedis pulses. Skin: warm and dry Neuro:  Grossly normal  EKG:  EKG shows normal sinus rhythm sharp Q waves in inferior leads. These were not noted on EKG from 2012.    ASSESSMENT AND PLAN:  Problem List Items Addressed This Visit   Obstructive sleep apnea   Morbid obesity   Hyperlipidemia   Essential hypertension     Patient almost at target blood pressure 130/80. We'll make no changes at this time    CAD (coronary artery disease)     Status post catheterization 12/22/2012 revealing mild coronary atherosclerosis, widely patent stents in the right coronary artery without any evidence of progression of disease when compared with cardiac catheterization from 2011.  The only flow-limiting stenosis is an ostial lesion and a small ramus intermedius artery that does not appear to be an important artery and is not amenable to percutaneous revascularization due to its small caliber.  The patient is complaining of chest soreness which is quite different from prior tightness he was feeling with exertion. He has not been exerting himself as he was prior to catheterization due to limitations on his right wrist. Recommended adding ibuprofen 60 mg twice daily for 5 days the soreness persists. He continues to worsen after ibuprofen as likely he'll need long-acting nitroglycerin and or Ranexa.       Other Visit Diagnoses   Chest pain    -  Primary    Relevant Orders       EKG 12-Lead

## 2012-12-30 NOTE — Assessment & Plan Note (Signed)
Status post catheterization 12/22/2012 revealing mild coronary atherosclerosis, widely patent stents in the right coronary artery without any evidence of progression of disease when compared with cardiac catheterization from 2011.  The only flow-limiting stenosis is an ostial lesion and a small ramus intermedius artery that does not appear to be an important artery and is not amenable to percutaneous revascularization due to its small caliber.  The patient is complaining of chest soreness which is quite different from prior tightness he was feeling with exertion. He has not been exerting himself as he was prior to catheterization due to limitations on his right wrist. Recommended adding ibuprofen 60 mg twice daily for 5 days the soreness persists. He continues to worsen after ibuprofen as likely he'll need long-acting nitroglycerin and or Ranexa.

## 2012-12-30 NOTE — Assessment & Plan Note (Signed)
Patient almost at target blood pressure 130/80. We'll make no changes at this time

## 2012-12-30 NOTE — Patient Instructions (Signed)
You may try 600mg  of ibuprofen twice daily for five days if the soreness continues.  If you notice tightness with exertion, we may need to restart isosorbide.

## 2012-12-31 ENCOUNTER — Telehealth: Payer: Self-pay | Admitting: Cardiovascular Disease

## 2012-12-31 MED ORDER — NITROGLYCERIN 0.4 MG SL SUBL: 0.4000 mg | SUBLINGUAL_TABLET | SUBLINGUAL | Status: DC | PRN

## 2012-12-31 NOTE — Telephone Encounter (Signed)
Craig Copeland needs a new prescription sent to the pharmacy on his nitro.... He can changed pharmacies    Thanks

## 2012-12-31 NOTE — Telephone Encounter (Signed)
Mr. Tangeman needs a new prescription sent to the pharmacy on his nitro ... He has changed pharmacies    Thanks

## 2012-12-31 NOTE — Telephone Encounter (Signed)
Spoke to patient. Prescription sent

## 2012-12-31 NOTE — Telephone Encounter (Signed)
Message error. Message on incorrect Craig Copeland (aka Craig Copeland)

## 2013-01-05 ENCOUNTER — Telehealth: Payer: Self-pay | Admitting: *Deleted

## 2013-01-05 NOTE — Telephone Encounter (Signed)
Call to pt. Left message to call back before 4pm. (2nd message: see Result Note)

## 2013-01-05 NOTE — Telephone Encounter (Signed)
Message copied by Chauncey Reading on Mon Jan 05, 2013 12:21 PM ------      Message from: Chrystie Nose      Created: Mon Dec 29, 2012  8:34 AM       Please notify patient that the test results were mildly abnormal normal, but no significant findings to be concerned about now.            -Dr. Rennis Golden       ------

## 2013-01-05 NOTE — Telephone Encounter (Signed)
Return your call

## 2013-01-05 NOTE — Telephone Encounter (Signed)
Returned call.  Wife stated pt called back from cell and gave number to call him back.  Call to pt's cell to give echo results.  Pt stated he was given the results "the other day."  RN apologized for miscommunication and confirmed appt for 7.18.14 at 8:45am.

## 2013-01-16 ENCOUNTER — Ambulatory Visit (INDEPENDENT_AMBULATORY_CARE_PROVIDER_SITE_OTHER): Payer: BC Managed Care – PPO | Admitting: Cardiovascular Disease

## 2013-01-16 ENCOUNTER — Encounter: Payer: Self-pay | Admitting: Cardiovascular Disease

## 2013-01-16 VITALS — BP 138/70 | HR 66 | Resp 20 | Ht 70.5 in | Wt 313.0 lb

## 2013-01-16 DIAGNOSIS — R079 Chest pain, unspecified: Secondary | ICD-10-CM

## 2013-01-16 DIAGNOSIS — I2510 Atherosclerotic heart disease of native coronary artery without angina pectoris: Secondary | ICD-10-CM

## 2013-01-16 DIAGNOSIS — I209 Angina pectoris, unspecified: Secondary | ICD-10-CM

## 2013-01-16 MED ORDER — RANOLAZINE ER 1000 MG PO TB12: 500.0000 mg | ORAL_TABLET | Freq: Two times a day (BID) | ORAL | Status: DC

## 2013-01-16 NOTE — Patient Instructions (Addendum)
Your physician recommends that you schedule a follow-up appointment in: One month with a PA.   Start Ranexa 1000mg  twice a day.

## 2013-01-18 ENCOUNTER — Encounter: Payer: Self-pay | Admitting: Cardiovascular Disease

## 2013-01-18 NOTE — Assessment & Plan Note (Signed)
Status post drug-eluting 3.0 x 33 mm Cypher stents overlapping 3.0 x 23 mm Cypher stent in the mid to proximal right coronary artery, 2004; Patent stents and 70% ostial lesion in a small ramus intermedius artery by repeat cardiac catheterization 2011; normal left ventricular systolic function

## 2013-01-18 NOTE — Progress Notes (Signed)
Patient ID: Craig Copeland, male   DOB: [birth year], 57 y.o.   MRN: [id number]     Reason for office visit Coronary Artery disease; followup after coronary angiography  [place] returns in followup after undergone coronary driver to what appeared to be unstable angina . His angiography showed that his previously placed stent in the right coronary was widely patent but he had a ostial lesion in a small to medium-size ramus intermedius artery that was not amenable to revascularization. He is also suspected to have vasospastic angina. In the weeks have elapsed since his catheterization his chest pain syndrome has settled into a more stable pattern. He notes it during physical exertion, most particularly during sexual intercourse.  He denies other cardiovascular complaints such as shortness of breath dizziness syncope palpitations edema or focal cortical deficits or intermittent claudication. He does not have erectile dysfunction.    Allergies  Allergen Reactions  . Ace Inhibitors Cough    Current Outpatient Prescriptions  Medication Sig Dispense Refill  . amLODipine (NORVASC) 5 MG tablet Take 2 tablets (10 mg total) by mouth daily.  90 tablet  3  . aspirin 81 MG tablet Take 81 mg by mouth daily.      . clopidogrel (PLAVIX) 75 MG tablet Take 75 mg by mouth daily.      [name] co-enzyme Q-10 30 MG capsule Take 30 mg by mouth daily.      . fish oil-omega-3 fatty acids 1000 MG capsule Take 2 g by mouth daily.       [name] losartan (COZAAR) 100 MG tablet Take 0.5 tablets (50 mg total) by mouth daily.  90 tablet  3  . montelukast (SINGULAIR) 10 MG tablet Take 10 mg by mouth daily.      . nitroGLYCERIN (NITROSTAT) 0.4 MG SL tablet Place 1 tablet (0.4 mg total) under the tongue every 5 (five) minutes as needed for chest pain.  25 tablet  6  . ranolazine (RANEXA) 1000 MG SR tablet Take 1 tablet (1,000 mg total) by mouth 2 (two) times daily.  180 tablet  3  . rosuvastatin (CRESTOR) 20 MG tablet Take 10 mg by  mouth daily.       [name] testosterone cypionate (DEPOTESTOTERONE CYPIONATE) 200 MG/ML injection Inject 200 mg into the muscle every 14 (fourteen) days.       No current facility-administered medications for this visit.    Past Medical History  Diagnosis Date  . CAD (coronary artery disease)   . Vasospastic angina   . Systemic hypertension   . Dizziness   . Dyslipidemia   . OSA (obstructive sleep apnea)   . Obesity     Past Surgical History  Procedure Laterality Date  . Coronary angioplasty with stent placement  11/13/2002    mid RCA  . Cardiac catheterization  12/02/2002    <20% restenosis RCA  . Cardiac catheterization  01/30/2010    20-30% in-stent restenosis,60-70% ostial stenosis  . Knee surgery    . Nose surgery      Family History  Problem Relation Age of Onset  . Heart failure Father   . Hypertension Father     History   Social History  . Marital Status: Married    Spouse Name: N/A    Number of Children: N/A  . Years of Education: N/A   Occupational History  . Not on file.   Social History Main Topics  . Smoking status: Never Smoker   . Smokeless tobacco: Not on file  .  Alcohol Use: 0.5 oz/week    1 drink(s) per week  . Drug Use: No  . Sexually Active: Not on file   Other Topics Concern  . Not on file   Social History Narrative  . No narrative on file    Review of systems: The patient specifically denies  dyspnea at rest or with exertion, orthopnea, paroxysmal nocturnal dyspnea, syncope, palpitations, focal neurological deficits, intermittent claudication, lower extremity edema, unexplained weight gain, cough, hemoptysis or wheezing.  The patient also denies abdominal pain, nausea, vomiting, dysphagia, diarrhea, constipation, polyuria, polydipsia, dysuria, hematuria, frequency, urgency, abnormal bleeding or bruising, fever, chills, unexpected weight changes, mood swings, change in skin or hair texture, change in voice quality, auditory or visual problems,  allergic reactions or rashes, new musculoskeletal complaints other than usual "aches and pains".   PHYSICAL EXAM BP 138/70  Pulse 66  Resp 20  Ht 5' 10.5" (1.791 m)  Wt 313, BMI>40  General: Alert, oriented x3, no distress;  Head: no evidence of trauma, PERRL, EOMI, no exophtalmos or lid lag, no myxedema, no xanthelasma; normal ears, nose and oropharynx Neck: normal jugular venous pulsations and no hepatojugular reflux; brisk carotid pulses without delay and no carotid bruits Chest: clear to auscultation, no signs of consolidation by percussion or palpation, normal fremitus, symmetrical and full respiratory excursions Cardiovascular: normal position and quality of the apical impulse, regular rhythm, normal first and second heart sounds, no murmurs, rubs or gallops Abdomen: no tenderness or distention, no masses by palpation, no abnormal pulsatility or arterial bruits, normal bowel sounds, no hepatosplenomegaly Extremities: no clubbing, cyanosis or edema; 2+ radial, ulnar and brachial pulses bilaterally; 2+ right femoral, posterior tibial and dorsalis pedis pulses; 2+ left femoral, posterior tibial and dorsalis pedis pulses; no subclavian or femoral bruits Neurological: grossly nonfocal   EKG: Sinus rhythm possible old inferior infarct  Lipid Panel  No results found for this basename: chol, trig, hdl, cholhdl, vldl, ldlcalc    BMET    Component Value Date/Time   NA 139 12/18/2012 0849   K 4.3 12/18/2012 0849   CL 106 12/18/2012 0849   CO2 24 12/18/2012 0849   GLUCOSE 104* 12/18/2012 0849   BUN 11 12/18/2012 0849   CREATININE 0.90 12/18/2012 0849   CALCIUM 9.3 12/18/2012 0849     ASSESSMENT AND PLAN No problem-specific assessment & plan notes found for this encounter.  Orders Placed This Encounter  Procedures  . EKG 12-Lead   Meds ordered this encounter  Medications  . ranolazine (RANEXA) 1000 MG SR tablet    Sig: Take 1 tablet (1,000 mg total) by mouth 2 (two) times daily.     Dispense:  180 tablet    Refill:  3    Yuktha Kerchner  Thurmon Fair, MD, [facility] [phone] office [phone] pager

## 2013-01-18 NOTE — Assessment & Plan Note (Addendum)
Current pattern is more consistent with exertional angina, although his anatomy is not much changed from 2 years ago.. Conceivably related to the ramus intermedius artery stenosis, he may benefit from Ranexa. He'll start 500 mg twice a day and increase to 1000 milligrams twice a day after which he'll return for reevaluation.

## 2013-02-13 ENCOUNTER — Ambulatory Visit (INDEPENDENT_AMBULATORY_CARE_PROVIDER_SITE_OTHER): Payer: BC Managed Care – PPO | Admitting: Physician Assistant

## 2013-02-13 ENCOUNTER — Encounter: Payer: Self-pay | Admitting: Physician Assistant

## 2013-02-13 VITALS — BP 136/64 | HR 68 | Ht 73.0 in | Wt 303.5 lb

## 2013-02-13 DIAGNOSIS — I209 Angina pectoris, unspecified: Secondary | ICD-10-CM

## 2013-02-13 DIAGNOSIS — I10 Essential (primary) hypertension: Secondary | ICD-10-CM

## 2013-02-13 NOTE — Patient Instructions (Signed)
Follow up with Dr. Royann Shivers in six months.  Please call if you need to be seen sooner.  Thanks for coming in today!

## 2013-02-13 NOTE — Assessment & Plan Note (Signed)
With the addition of Ranexa, the patient has noticed resolution of anginal symptoms.

## 2013-02-13 NOTE — Assessment & Plan Note (Signed)
Pressures mildly above target today the patient states when he checks his blood pressure intermittently, it is usually in the 120s over 60s.

## 2013-02-13 NOTE — Assessment & Plan Note (Signed)
The patient's place of employment has recently started a weight loss program.  Reports it given him a pedometer so they can track of the number of steps was walking.  According to weights in the office here he's lost approximately 10 pounds just in the last few weeks.  I encouraged him to continue with the effort.

## 2013-02-13 NOTE — Progress Notes (Signed)
Date:  02/13/2013   ID:  Craig Copeland, DOB 1955-12-05, MRN [id number]  PCP:  [name], MD  Primary Cardiologist: Croitor[facility]     History of Present Illness: Craig Copeland is a 57 y.o. male with a history of stent to the proximal to mid right coronary artery in 2004. Repeat angiography in 2011 showed the stents to be patent. He had symptoms suggestive of vasospastic angina at the time and indeed improved considerably with amlodipine therapy. For the next 2 years he did really well and has not required any sublingual nitroglycerin or additional evaluation for coronary problems. Patient recently noticed recurrent problems with chest tightness which occurred with working. He was scheduled for left heart catheterization which revealed mild coronary atherosclerosis widely patent stents in the right coronary artery without any evidence of progression of disease when compared to prior cath in 2011. Was flow-limiting stenosis in the ostial lesion a small ramus intermediate artery is not amenable to PCI to 2 small-caliber.  Patient had a 2-D echocardiogram on 12/26/2012 which showed an ejection fraction of 60-65% and normal wall motion. Left ventricular diastolic function parameters were borderline abnormal.   Patient presents today for followup appointment. He saw Dr. Royann Shivers July 18 at which time he started the patient on Ranexa 1000 mg twice daily. Patient reports this is after the first week of taking it has made a substantial improvement in his anginal pain and he cannot exert himself to a greater extent without any discomfort whatsoever.  Patient reports he started seeing a dietitian under a program after having at work now. Reports losing approximately 3 pounds however, at his last office visit he was 313 and today he is 303 pounds.  Which is much better than expected.  The patient currently denies nausea, vomiting, fever, chest pain, shortness of breath, orthopnea, dizziness, PND,  cough, congestion, abdominal pain, hematochezia, melena, lower extremity edema, claudication.  Wt Readings from Last 3 Encounters:  02/13/13 303 lb 8 oz (137.667 kg)  01/16/13 313 lb (141.976 kg)  12/30/12 309 lb 4.8 oz (140.298 kg)     Past Medical History  Diagnosis Date  . CAD (coronary artery disease)   . Vasospastic angina   . Systemic hypertension   . Dizziness   . Dyslipidemia   . OSA (obstructive sleep apnea)   . Obesity     Current Outpatient Prescriptions  Medication Sig Dispense Refill  . amLODipine (NORVASC) 5 MG tablet Take 2 tablets (10 mg total) by mouth daily.  90 tablet  3  . aspirin 81 MG tablet Take 81 mg by mouth daily.      . clopidogrel (PLAVIX) 75 MG tablet Take 75 mg by mouth daily.      [name] co-enzyme Q-10 30 MG capsule Take 30 mg by mouth daily.      . fish oil-omega-3 fatty acids 1000 MG capsule Take 2 g by mouth daily.       [name] losartan (COZAAR) 100 MG tablet Take 0.5 tablets (50 mg total) by mouth daily.  90 tablet  3  . montelukast (SINGULAIR) 10 MG tablet Take 10 mg by mouth daily.      . nitroGLYCERIN (NITROSTAT) 0.4 MG SL tablet Place 1 tablet (0.4 mg total) under the tongue every 5 (five) minutes as needed for chest pain.  25 tablet  6  . ranolazine (RANEXA) 1000 MG SR tablet Take 1 tablet (1,000 mg total) by mouth 2 (two) times daily.  180 tablet  3  .  rosuvastatin (CRESTOR) 20 MG tablet Take 10 mg by mouth daily.       [name] testosterone cypionate (DEPOTESTOTERONE CYPIONATE) 200 MG/ML injection Inject 200 mg into the muscle every 14 (fourteen) days.      [name] VIAGRA 100 MG tablet Take 1 tablet by mouth as needed.       No current facility-administered medications for this visit.    Allergies:    Allergies  Allergen Reactions  . Ace Inhibitors Cough    Social History:  The patient  reports that he has never smoked. He has never used smokeless tobacco. He reports that he does not drink alcohol or use illicit drugs.   Family history:   Family  History  Problem Relation Age of Onset  . Heart failure Father   . Hypertension Father     ROS:  Please see the history of present illness.  All other systems reviewed and negative.   PHYSICAL EXAM: VS:  BP 136/64  Pulse 68  Ht 6\' 1"  (1.854 m)  Wt 303 lb 8 oz (137.667 kg)  BMI 40.05 kg/m2 Well nourished, well developed, in no acute distress HEENT: Pupils are equal round react to light accommodation extraocular movements are intact.  Cardiac: Regular rate and rhythm with 1/6 sys murmur.  No rubs or gallops. Lungs:  clear to auscultation bilaterally, no wheezing, rhonchi or rales Ext: Trace of lower extremity edema.  2+ radial and dorsalis pedis pulses. Skin: warm and dry Neuro:  Grossly normal  EKG:    ASSESSMENT AND PLAN:  Problem List Items Addressed This Visit   Morbid obesity     The patient's place of employment has recently started a weight loss program.  Reports it given him a pedometer so they can track of the number of steps was walking.  According to weights in the office here he's lost approximately 10 pounds just in the last few weeks.  I encouraged him to continue with the effort.    Essential hypertension     Pressures mildly above target today the patient states when he checks his blood pressure intermittently, it is usually in the 120s over 60s.    Relevant Medications      VIAGRA 100 MG tablet   Angina pectoris syndrome - Primary     With the addition of Ranexa, the patient has noticed resolution of anginal symptoms.      Relevant Medications      VIAGRA 100 MG tablet

## 2013-02-17 ENCOUNTER — Ambulatory Visit: Payer: BC Managed Care – PPO | Admitting: Physician Assistant

## 2013-12-24 ENCOUNTER — Ambulatory Visit (INDEPENDENT_AMBULATORY_CARE_PROVIDER_SITE_OTHER): Payer: BC Managed Care – PPO | Admitting: Neurology

## 2013-12-24 ENCOUNTER — Encounter: Payer: Self-pay | Admitting: Neurology

## 2013-12-24 VITALS — BP 132/77 | HR 67 | Temp 97.5°F | Ht 73.0 in | Wt 313.0 lb

## 2013-12-24 DIAGNOSIS — R600 Localized edema: Secondary | ICD-10-CM

## 2013-12-24 DIAGNOSIS — M25562 Pain in left knee: Secondary | ICD-10-CM

## 2013-12-24 DIAGNOSIS — G4733 Obstructive sleep apnea (adult) (pediatric): Secondary | ICD-10-CM

## 2013-12-24 DIAGNOSIS — R609 Edema, unspecified: Secondary | ICD-10-CM

## 2013-12-24 DIAGNOSIS — M25569 Pain in unspecified knee: Secondary | ICD-10-CM

## 2013-12-24 DIAGNOSIS — Z9989 Dependence on other enabling machines and devices: Secondary | ICD-10-CM

## 2013-12-24 DIAGNOSIS — E669 Obesity, unspecified: Secondary | ICD-10-CM

## 2013-12-24 DIAGNOSIS — G609 Hereditary and idiopathic neuropathy, unspecified: Secondary | ICD-10-CM

## 2013-12-24 NOTE — Patient Instructions (Signed)
You may have a condition called peripheral neuropathy, i. e. nerve damage. There is no specific treatment for most neuropathies. The most common cause for neuropathy is diabetes in this country, in which case, tight glucose control is key. Other causes include thyroid disease, and some vitamin deficiencies. Certain medications such as chemotherapy agents and other chemicals or toxins including alcohol can cause neuropathy. There are some genetic conditions or hereditary neuropathies. Typically patients will report a family history of neuropathy in those conditions. There are cases associated with cancers and autoimmune conditions. Most neuropathies are progressive unless a root cause can be found and treated. For most neuropathies there is no actual cure or reversing of symptoms. Painful neuropathy can be difficult to treat symptomatically, but there are some medications available to ease the symptoms. Electrophysiologic testing with nerve conduction velocity studies and EMG (muscle testing) do not always pick up neuropathies that affect the smallest fibers. Other common tests include different type of blood work, and rarely, spinal fluid testing, and sometimes we resort to asking for a nerve and muscle biopsy.  I would like to investigate things further to look for evidence of neuropathy or nerve damage; therefore, I would like to do some blood work, and electrical testing of your muscles and nerves, which is known as EMG/NCV.  

## 2013-12-24 NOTE — Progress Notes (Signed)
Subjective:    Patient ID: Craig FisherMichael K Copeland is a 58 y.o. male.  HPI    Craig FoleySaima Athar, MD, PhD [facility] [address], [address] [address] [place], [place] [address]  Dear Dr. Renne CriglerPharr,   I saw your patient, Craig Copeland, upon your kind request in my neurologic clinic today for initial consultation of his paresthesias. The patient is unaccompanied today. As you know, Craig Copeland is a very pleasant 58 year old right-handed gentleman with an underlying complex medical history of coronary artery disease, status post MI and PTCA (10 years ago), morbid obesity, obstructive sleep apnea on CPAP, chronic leg edema, s/p L knee reconstruction, hyperlipidemia, hypertension, prediabetes, allergic rhinitis, and hypogonadism, status post cataract surgeries, left knee replacement surgery, shoulder arthroscopic surgery, who reports numbness of his hand and feet for the past 6 months. He describes a sense of numbness in primarily his fingertips and sometimes his feet to the point, that he drops little things from his hands. He does not actually feel weak and has no pain, or burning, rare tingling. 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 He quit smoking in 78. He does not drink caffeine. He drinks beer 2 times per week. He has no other complaints. Never had TIA or stroke symptoms, denying sudden onset of one sided weakness, numbness, tingling, slurring of speech or droopy face, hearing loss, tinnitus, diplopia or visual field cut or monocular loss of vision, and denies recurrent headaches. 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 His mother had polio and postpolio syndrome. There is no family history of neuropathy or muscle disease. He had similar symptoms  a couple of years ago in his fingertips when he was on Lipitor and he was on it for 3-4 years and after he was switched to Crestor within 4-5 weeks.   His Past Medical History Is Significant For: Past Medical History  Diagnosis Date  . CAD (coronary artery disease)   . Vasospastic angina   . Systemic hypertension   . Dizziness   . Dyslipidemia   . OSA (obstructive sleep apnea)   . Obesity     His Past Surgical History Is Significant For: Past Surgical History  Procedure Laterality Date  . Coronary angioplasty with stent placement  11/13/2002    mid RCA  . Cardiac catheterization  12/02/2002    <20% restenosis RCA  . Cardiac catheterization  01/30/2010    20-30% in-stent restenosis,60-70% ostial stenosis  . Knee surgery  2003  . Nose surgery    . Vasectomy      His Family History Is Significant For: Family History  Problem Relation Age of Onset  . Heart failure Father   . Hypertension Father   . Cancer Paternal Grandfather   . Stroke Paternal Grandmother   . Heart failure Maternal Grandmother   . Alzheimer's disease Paternal Grandfather     His Social History Is Significant For: History   Social History  . Marital Status: Married    Spouse Name: N/A    Number of Children: N/A  . Years of Education: N/A   Social History Main Topics  . Smoking status: Never Smoker   . Smokeless tobacco: Never Used  . Alcohol Use: No  . Drug Use: No  .  Sexual Activity: None   Other Topics Concern  . None   Social History Narrative  . None    His Allergies Are:  Allergies  Allergen Reactions  . Ace Inhibitors Cough  :   His Current Medications Are:  Outpatient Encounter Prescriptions as of 12/24/2013  Medication Sig  . amLODipine (NORVASC) 5 MG tablet Take 2 tablets (10 mg total) by mouth daily.  [name] aspirin 81 MG tablet Take 81 mg by mouth daily.  . clopidogrel (PLAVIX) 75 MG tablet Take 75 mg by mouth daily.  [name] co-enzyme Q-10 30 MG capsule Take 100 mg by mouth daily.    . fish oil-omega-3 fatty acids 1000 MG capsule Take 2 g by mouth daily.   [name] losartan (COZAAR) 100 MG tablet Take 0.5 tablets (50 mg total) by mouth daily.  . montelukast (SINGULAIR) 10 MG tablet Take 10 mg by mouth daily.  . nitroGLYCERIN (NITROSTAT) 0.4 MG SL tablet Place 1 tablet (0.4 mg total) under the tongue every 5 (five) minutes as needed for chest pain.  . ranolazine (RANEXA) 1000 MG SR tablet Take 1 tablet (1,000 mg total) by mouth 2 (two) times daily.  . rosuvastatin (CRESTOR) 20 MG tablet Take 10 mg by mouth daily.   [name] testosterone cypionate (DEPOTESTOTERONE CYPIONATE) 200 MG/ML injection Inject 200 mg into the muscle every 14 (fourteen) days.  [name] VIAGRA 100 MG tablet Take 1 tablet by mouth as needed.  : Review of Systems:  Out of a complete 14 point review of systems, all are reviewed and negative with the exception of these symptoms as listed below:  Review of Systems  Constitutional: Positive for fatigue.  HENT: Negative.   Eyes: Positive for visual disturbance (blurred vision).  Respiratory: Positive for cough.   Cardiovascular: Negative.   Gastrointestinal: Negative.   Endocrine: Negative.   Genitourinary: Negative.   Musculoskeletal: Negative.   Skin: Negative.   Allergic/Immunologic: Positive for environmental allergies.  Neurological: Positive for numbness (fingers of both hands).  Hematological: Bruises/bleeds easily.  Psychiatric/Behavioral: Positive for sleep disturbance (snoring).    Objective:  Neurologic Exam  Physical Exam Physical Examination:   Filed Vitals:   12/24/13 0838  BP: 132/77  Pulse: 67  Temp: 97.5 F (36.4 C)   General Examination: The patient is a very pleasant 58 y.o. male in no acute distress. He appears well-developed and well-nourished and well groomed.   HEENT: Normocephalic, atraumatic, pupils are equal, round and reactive to light and accommodation. Funduscopic exam is normal with sharp disc margins noted. He had cataract  repair on the left. He has a mild cataract on the right. Extraocular tracking is good without limitation to gaze excursion or nystagmus noted. Normal smooth pursuit is noted. Hearing is grossly intact. Tympanic membranes are clear bilaterally. Face is symmetric with normal facial animation and normal facial sensation. Speech is clear with no dysarthria noted. There is no hypophonia. There is no lip, neck/head, jaw or voice tremor. Neck is supple with full range of passive and active motion. There are no carotid bruits on auscultation. Oropharynx exam reveals: mild mouth dryness, adequate dental hygiene and moderate airway crowding, due to narrow airway entry and thicker time. Mallampati is class II. Tongue protrudes centrally and palate elevates symmetrically. Tonsils are small in size. Neck size is 21-3/4 inches. He has a Mild overbite. Nasal inspection reveals no significant nasal mucosal bogginess or redness and no septal deviation. He states he did have septoplasty years ago.  Chest: Clear to auscultation without wheezing,  rhonchi or crackles noted.  Heart: S1+S2+0, regular and normal without murmurs, rubs or gallops noted.   Abdomen: Soft, non-tender and non-distended with normal bowel sounds appreciated on auscultation.  Extremities: There is trace pitting edema in the distal lower extremities bilaterally, left more in the realm of 1+. Pedal pulses are intact.  Skin: Warm and dry without trophic changes noted, except for sunburn to both distal legs with peeling. There are no varicose veins.  Musculoskeletal: exam reveals no obvious joint deformities, tenderness or joint swelling or erythema, except for s/p surgery L knee and decrease in ROM there.   Neurologically:  Mental status: The patient is awake, alert and oriented in all 4 spheres. His immediate and remote memory, attention, language skills and fund of knowledge are appropriate. There is no evidence of aphasia, agnosia, apraxia or anomia.  Speech is clear with normal prosody and enunciation. Thought process is linear. Mood is normal and affect is normal.  Cranial nerves II - XII are as described above under HEENT exam. In addition: shoulder shrug is normal with equal shoulder height noted. Motor exam: Normal bulk, strength and tone is noted. There is no drift, tremor or rebound. Romberg is negative. Reflexes are 2+ throughout. Babinski: Toes are flexor bilaterally. Fine motor skills and coordination: intact with normal finger taps, normal hand movements, normal rapid alternating patting, normal foot taps and normal foot agility.  Cerebellar testing: No dysmetria or intention tremor on finger to nose testing. Heel to shin is unremarkable bilaterally. There is no truncal or gait ataxia.  Sensory exam: intact to light touch, pinprick, vibration, temperature sense and proprioception in the upper and lower extremities with the exception of decrease in PP and temperature in the distal 2/3 of his fingers and in the b/l forefeet. He has no wasting or weakness, no fasciculations and no pain. He also has decrease in light touch and pinprick sensation in the distal leg from the knee down in the frontal aspect. He states that this has been chronic since his left leg injury. Gait, station and balance: He stands easily. No veering to one side is noted. No leaning to one side is noted. Posture is age-appropriate and stance is narrow based. Gait shows normal stride length and normal pace. No problems turning are noted. He turns en bloc. Tandem walk is unremarkable. Intact toe and heel stance is noted.               Assessment and Plan:    In summary, SAVVAS ROPER is a very pleasant 58 y.o.-year old male with an underlying complex medical history of coronary artery disease, status post MI and PTCA (10 years ago), morbid obesity, obstructive sleep apnea on CPAP, chronic leg edema, s/p L knee reconstruction, hyperlipidemia, hypertension, prediabetes,  allergic rhinitis, and hypogonadism, status post cataract surgeries, left knee replacement surgery, shoulder arthroscopic surgery, who reports numbness of his hand and feet for the past 6 months. On examination he has evidence of decreased sensation to temperature and pinprick primarily in his distal upper and lower extremities primarily in his fingers and toes. Proprioception and vibration seems to be fairly well preserved. He has no muscle wasting. He describes no actual pain or weakness. While I cannot exclude that his symptoms do not stem from being on Crestor, we will look at other treatable causes or etiologies for neuropathy at this time. I suggested blood work and electrophysiological testing in the form of EMG nerve conduction studies. He was in agreement. We will  be informing him via phone about the test results as they come in. I would like to suggest that he may want to consider changing from Crestor to something else such as Zocor or Pravachol as an experiment down the Road unless we find some other cause for his symptoms. He is in agreement and will discuss this with you. As far as his obesity, he has tried to go on a diet but has been unsuccessful. He is advised to get in touch with your office regarding a referral to a nutritionist. He is compliant with CPAP therapy but is not sure if the settings her right. He is encouraged to meet with his sleep doctor and get an adjustment to his CPAP. He may need a sleep study since he has not had one in over 10 years. His machine is also not working properly and he may need an updated CPAP machine as it is over 56 years old.  As far as medications are concerned, I recommended the following at this time: no change at this point in time.  I answered all his questions today and the patient was in agreement with the above outlined plan. I would like to see the patient back in 3 months, sooner if the need arises and encouraged him to call with any interim  questions, concerns, problems or updates.  Thank you very much for allowing me to participate in the care of this nice patient. If I can be of any further assistance to you please do not hesitate to call me at [phone].  Sincerely,   Craig FoleySaima Athar, MD, PhD

## 2013-12-25 ENCOUNTER — Telehealth: Payer: Self-pay | Admitting: Neurology

## 2013-12-25 LAB — B12 AND FOLATE PANEL
Folate: 14.7 ng/mL (ref >3.0)
VITAMIN B 12: 239 pg/mL (ref 211–946)

## 2013-12-25 LAB — CK: Total CK: 121 U/L (ref 24–204)

## 2013-12-25 LAB — VITAMIN D 25 HYDROXY (VIT D DEFICIENCY, FRACTURES): Vit D, 25-Hydroxy: 23.2 ng/mL — ABNORMAL LOW (ref 30.0–100.0)

## 2013-12-25 LAB — HGB A1C W/O EAG: Hgb A1c MFr Bld: 5.6 % (ref 4.8–5.6)

## 2013-12-25 LAB — TSH: TSH: 1.7 u[IU]/mL (ref 0.450–4.500)

## 2013-12-25 LAB — C-REACTIVE PROTEIN: CRP: 2 mg/L (ref 0.0–4.9)

## 2013-12-25 LAB — RHEUMATOID FACTOR

## 2013-12-25 LAB — ALDOLASE: Aldolase: 5.6 U/L (ref 3.3–10.3)

## 2013-12-25 LAB — RPR: SYPHILIS RPR SCR: NONREACTIVE

## 2013-12-25 LAB — ANA W/REFLEX: Anti Nuclear Antibody(ANA): NEGATIVE

## 2013-12-25 LAB — SEDIMENTATION RATE: SED RATE: 8 mm/h (ref 0–30)

## 2013-12-25 NOTE — Telephone Encounter (Signed)
Pt returning Amiee's call, advised would send message back for a return call concerning lab work. Thanks

## 2013-12-25 NOTE — Progress Notes (Signed)
Quick Note:  Please call and advise the patient that the recent labs we checked were within normal limits with the exception of mildly low vitamin D. For this, he can start taking over-the-counter vitamin D supplement, 1000-2000 units a day should be fine and at some point he should be rechecked for vitamin D level, perhaps in 3 months. Otherwise, we checked vitamin B12 level, diabetes marker, thyroid function, inflammatory markers, sedimentation rate, muscle enzymes, all of which were fine. No further action is required on these tests at this time. Please remind patient to keep any upcoming appointments or tests and to call us with any interim questions, concerns, problems or updates. Thanks,  Huston FoleySaima Lillyonna Armstead, MD, PhD    ______

## 2013-12-28 NOTE — Telephone Encounter (Signed)
I LMVM for pt on home, not realizing that he had already been given results.  (as per seen below).

## 2013-12-28 NOTE — Telephone Encounter (Signed)
Called pt to inform him per Dr. Frances FurbishAthar that his lab work results were within normal limits with the exception of mildly low vitamin D and that Dr. Frances FurbishAthar would like for the pt to start taking a over-the-counter vitamin D supplement, 1000-2000 units a day and in about 3 months have vitamin D level rechecked, but every thing else was fine and to keep any up coming appts or test that the pt may have and if the pt has any other problems, questions or concerns to call the office. Pt verbalized understanding.

## 2014-01-12 ENCOUNTER — Other Ambulatory Visit: Payer: Self-pay | Admitting: Cardiovascular Disease

## 2014-01-13 NOTE — Telephone Encounter (Signed)
Rx was sent to pharmacy electronically. 

## 2014-01-20 ENCOUNTER — Encounter (INDEPENDENT_AMBULATORY_CARE_PROVIDER_SITE_OTHER): Payer: BC Managed Care – PPO

## 2014-01-20 ENCOUNTER — Ambulatory Visit (INDEPENDENT_AMBULATORY_CARE_PROVIDER_SITE_OTHER): Payer: BC Managed Care – PPO | Admitting: Neurology

## 2014-01-20 DIAGNOSIS — G4733 Obstructive sleep apnea (adult) (pediatric): Secondary | ICD-10-CM

## 2014-01-20 DIAGNOSIS — R209 Unspecified disturbances of skin sensation: Secondary | ICD-10-CM

## 2014-01-20 DIAGNOSIS — G609 Hereditary and idiopathic neuropathy, unspecified: Secondary | ICD-10-CM

## 2014-01-20 DIAGNOSIS — Z0289 Encounter for other administrative examinations: Secondary | ICD-10-CM

## 2014-01-20 DIAGNOSIS — E669 Obesity, unspecified: Secondary | ICD-10-CM

## 2014-01-20 DIAGNOSIS — R600 Localized edema: Secondary | ICD-10-CM

## 2014-01-20 DIAGNOSIS — Z9989 Dependence on other enabling machines and devices: Secondary | ICD-10-CM

## 2014-01-20 DIAGNOSIS — M25562 Pain in left knee: Secondary | ICD-10-CM

## 2014-01-20 NOTE — Procedures (Signed)
HISTORY:  Craig Copeland is a 58 year old gentleman with a history of tingling and numbness in the hands, dropping things from the left hand over the last 2 years. He also notes some numbness in the anterolateral aspect of the left leg between the knee and ankle following knee surgery 9 years ago. He denies any weakness of the left leg. He denies neck or low back pain. The patient is being evaluated for possible neuropathy or a radiculopathy.  NERVE CONDUCTION STUDIES:  Nerve conduction studies were performed on both upper extremities. The distal motor latency for the right median nerve was prolonged, normal on the left. The motor amplitudes for these nerves were normal bilaterally. The distal motor latencies and motor amplitudes for the ulnar nerves were normal bilaterally. The F wave latencies for the right median and ulnar nerves were slightly prolonged, normal for the left median and ulnar nerves. Nerve conduction velocities for the median and ulnar nerves were normal bilaterally. The sensory latency for the right median nerve was prolonged, normal for the left median nerve and for the ulnar nerves bilaterally.  Nerve conduction studies were performed on the left lower extremity. The distal motor latencies and motor amplitudes for the peroneal and posterior tibial nerves were within normal limits. The nerve conduction velocities for these nerves were also normal. The sensory latency for the peroneal nerve was within normal limits.   EMG STUDIES:  EMG study was performed on the left upper extremity:  The first dorsal interosseous muscle reveals 2 to 4 K units with full recruitment. No fibrillations or positive waves were noted. The abductor pollicis brevis muscle reveals 2 to 4 K units with full recruitment. No fibrillations or positive waves were noted. The extensor indicis proprius muscle reveals 1 to 3 K units with full recruitment. No fibrillations or positive waves were noted. The  pronator teres muscle reveals 2 to 3 K units with full recruitment. No fibrillations or positive waves were noted. The biceps muscle reveals 1 to 2 K units with full recruitment. No fibrillations or positive waves were noted. The triceps muscle reveals 2 to 4 K units with full recruitment. No fibrillations or positive waves were noted. The anterior deltoid muscle reveals 2 to 3 K units with full recruitment. No fibrillations or positive waves were noted. The cervical paraspinal muscles were tested at 2 levels. No abnormalities of insertional activity were seen at either level tested. There was good relaxation.  EMG study was performed on the left lower extremity:  The tibialis anterior muscle reveals 2 to 4K motor units with full recruitment. No fibrillations or positive waves were seen. The peroneus tertius muscle reveals 2 to 4K motor units with full recruitment. No fibrillations or positive waves were seen. The medial gastrocnemius muscle reveals 1 to 3K motor units with full recruitment. No fibrillations or positive waves were seen. The vastus lateralis muscle reveals 2 to 4K motor units with full recruitment. No fibrillations or positive waves were seen. The iliopsoas muscle reveals 2 to 4K motor units with full recruitment. No fibrillations or positive waves were seen. The biceps femoris muscle (long head) reveals 2 to 4K motor units with full recruitment. No fibrillations or positive waves were seen. The lumbosacral paraspinal muscles were tested at 3 levels, and revealed no abnormalities of insertional activity at all 3 levels tested. There was good relaxation.   IMPRESSION:  Nerve conduction studies done on both upper extremities and on the left lower extremity reveals no evidence of  a peripheral neuropathy. There does appear to be evidence of a mild right carpal tunnel syndrome. EMG evaluation of the left upper extremity is unremarkable, without evidence of an overlying cervical  radiculopathy. EMG of the left lower extremity is unremarkable, without evidence of a lumbosacral radiculopathy. 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  Marlan Palau MD 01/20/2014 2:02 PM  [facility] [address] [place], [place] [address]  Phone [phone] Fax [phone]

## 2014-01-20 NOTE — Progress Notes (Signed)
Quick Note:  Please advise patient that his electrical nerve and muscle test recently did not show any obvious findings of neuropathy. No evidence of widespread nerve damage is seen. There is evidence of mild right-sided carpal tunnel syndrome for which he can start using an over-the-counter splint if he has symptoms. Nothing to suggest nerve impingement coming from the upper back or lower back either.  Craig FoleySaima Emillia Weatherly, MD, PhD [facility] ([facility])  ______

## 2014-01-25 ENCOUNTER — Telehealth: Payer: Self-pay | Admitting: Neurology

## 2014-01-25 NOTE — Telephone Encounter (Signed)
Pt was calling with EEG results. Pt verbalized understanding.

## 2014-01-25 NOTE — Telephone Encounter (Signed)
Patient states he is returning your call. 

## 2014-01-25 NOTE — Telephone Encounter (Signed)
Pt was called about his EMG/NCV results. Pt verbalized understanding. Disregard previous note.

## 2014-02-09 ENCOUNTER — Other Ambulatory Visit: Payer: Self-pay | Admitting: Gastroenterology

## 2014-02-12 ENCOUNTER — Encounter: Payer: Self-pay | Admitting: Cardiovascular Disease

## 2014-02-18 ENCOUNTER — Other Ambulatory Visit: Payer: Self-pay | Admitting: Gastroenterology

## 2014-02-18 DIAGNOSIS — R1011 Right upper quadrant pain: Secondary | ICD-10-CM

## 2014-02-25 ENCOUNTER — Ambulatory Visit
Admission: RE | Admit: 2014-02-25 | Discharge: 2014-02-25 | Disposition: A | Discharge status: Home / Self Care | Payer: BC Managed Care – PPO | Source: Ambulatory Visit | Attending: Gastroenterology | Admitting: Gastroenterology

## 2014-02-25 DIAGNOSIS — R1011 Right upper quadrant pain: Secondary | ICD-10-CM

## 2014-03-29 ENCOUNTER — Ambulatory Visit (INDEPENDENT_AMBULATORY_CARE_PROVIDER_SITE_OTHER): Payer: BC Managed Care – PPO | Admitting: Cardiovascular Disease

## 2014-03-29 ENCOUNTER — Encounter: Payer: Self-pay | Admitting: Cardiovascular Disease

## 2014-03-29 VITALS — BP 123/76 | HR 62 | Resp 16 | Ht 73.0 in | Wt 309.1 lb

## 2014-03-29 DIAGNOSIS — E785 Hyperlipidemia, unspecified: Secondary | ICD-10-CM

## 2014-03-29 DIAGNOSIS — G4733 Obstructive sleep apnea (adult) (pediatric): Secondary | ICD-10-CM

## 2014-03-29 DIAGNOSIS — I209 Angina pectoris, unspecified: Secondary | ICD-10-CM

## 2014-03-29 DIAGNOSIS — I10 Essential (primary) hypertension: Secondary | ICD-10-CM

## 2014-03-29 DIAGNOSIS — I2510 Atherosclerotic heart disease of native coronary artery without angina pectoris: Secondary | ICD-10-CM

## 2014-03-29 DIAGNOSIS — I25111 Atherosclerotic heart disease of native coronary artery with angina pectoris with documented spasm: Secondary | ICD-10-CM

## 2014-03-29 NOTE — Patient Instructions (Signed)
Dr. Croitoru recommends that you schedule a follow-up appointment in: One Year.   

## 2014-03-29 NOTE — Progress Notes (Signed)
Patient ID: Craig Copeland, male   DOB: 1955/11/20, 58 y.o.   MRN: [id number]      Reason for office visit CAD, OSA, Obesity, HTN, Dyslipidemia (low HDL)   It has been just over a year since Craig Copeland's last appointment and he has not had any new complaints during that time. He has a history of previous stent to the right coronary artery(2004, drug-eluting 3.0 x 33 mm Cypher stent overlapping 3.0 x 23 mm Cypher stent in the mid to proximal right coronary artery) and a moderate to severe ostial lesion of a small to medium ramus intermedius artery (too small for percutaneous revascularization). 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  His last cardiac catheterization was in 2014, that showed findings identical to 2011 and 2004. He has normal left ventricle systolic function by echo most recently performed in June of 2014.  History of hypertension and hyperlipidemia. He is morbidly obese. He is compliant with CPAP for obstructive sleep apnea. He is on chronic treatment with aspirin and clopidogrel. He has erectile dysfunction and has taken phosphodiesterase inhibitors in the past. He knows that he should never combine nitrates with Viagra.  Today he has no cardiac complaints whatsoever. It has been well over a year since he had any chest discomfort.  No Active Allergies  Current Outpatient Prescriptions  Medication Sig Dispense Refill  . amLODipine (NORVASC) 5 MG tablet Take 2 tablets (10 mg total) by mouth daily.  90 tablet  3  . aspirin 81 MG tablet Take 81 mg by mouth daily.      . clopidogrel (PLAVIX) 75 MG tablet Take 75 mg by mouth daily.      [name] co-enzyme Q-10 30 MG capsule Take 100 mg by mouth daily.       . fish oil-omega-3 fatty acids 1000 MG capsule Take 2 g by mouth daily.       [name] losartan (COZAAR) 100 MG tablet Take 0.5 tablets (50 mg total) by mouth daily.  90 tablet  3  . montelukast (SINGULAIR) 10 MG tablet  Take 10 mg by mouth daily.      . nitroGLYCERIN (NITROSTAT) 0.4 MG SL tablet Place 1 tablet (0.4 mg total) under the tongue every 5 (five) minutes as needed for chest pain.  25 tablet  6  . RANEXA 1000 MG SR tablet TAKE 1 BY MOUTH TWICE DAILY  180 tablet  0  . rosuvastatin (CRESTOR) 20 MG tablet Take 10 mg by mouth daily.       [name] testosterone cypionate (DEPOTESTOTERONE CYPIONATE) 200 MG/ML injection Inject 200 mg into the muscle every 14 (fourteen) days.      [name] VIAGRA 100 MG tablet Take 1 tablet by mouth as needed.       No current facility-administered medications for this visit.    Past Medical History  Diagnosis Date  . CAD (coronary artery disease)   . Vasospastic angina   . Systemic hypertension   . Dizziness   . Dyslipidemia   . OSA (obstructive sleep apnea)   . Obesity     Past Surgical History  Procedure Laterality Date  . Coronary angioplasty with stent placement  11/13/2002    mid RCA  . Cardiac catheterization  12/02/2002    <20% restenosis RCA  . Cardiac catheterization  01/30/2010    20-30% in-stent restenosis,60-70% ostial stenosis  . Knee surgery  2003  . Nose surgery    . Vasectomy  Family History  Problem Relation Age of Onset  . Heart failure Father   . Hypertension Father   . Cancer Paternal Grandfather   . Stroke Paternal Grandmother   . Heart failure Maternal Grandmother   . Alzheimer's disease Paternal Grandfather     History   Social History  . Marital Status: Married    Spouse Name: N/A    Number of Children: N/A  . Years of Education: N/A   Occupational History  . Not on file.   Social History Main Topics  . Smoking status: Never Smoker   . Smokeless tobacco: Never Used  . Alcohol Use: No  . Drug Use: No  . Sexual Activity: Not on file   Other Topics Concern  . Not on file   Social History Narrative  . No narrative on file    Review of systems: The patient specifically denies any chest pain at rest or with exertion, dyspnea  at rest or with exertion, orthopnea, paroxysmal nocturnal dyspnea, syncope, palpitations, focal neurological deficits, intermittent claudication, lower extremity edema, unexplained weight gain, cough, hemoptysis or wheezing.  The patient also denies abdominal pain, nausea, vomiting, dysphagia, diarrhea, constipation, polyuria, polydipsia, dysuria, hematuria, frequency, urgency, abnormal bleeding or bruising, fever, chills, unexpected weight changes, mood swings, change in skin or hair texture, change in voice quality, auditory or visual problems, allergic reactions or rashes, new musculoskeletal complaints other than usual "aches and pains".   PHYSICAL EXAM BP 123/76  Pulse 62  Resp 16  Ht  (1.854 m)  Wt 140.207 kg (309 lb 1.6 oz)  BMI 40.79 kg/m2 General: Alert, oriented x3, no distress; Morbid obesity limits his exam Head: no evidence of trauma, PERRL, EOMI, no exophtalmos or lid lag, no myxedema, no xanthelasma; normal ears, nose and oropharynx  Neck: normal jugular venous pulsations and no hepatojugular reflux; brisk carotid pulses without delay and no carotid bruits  Chest: clear to auscultation, no signs of consolidation by percussion or palpation, normal fremitus, symmetrical and full respiratory excursions  Cardiovascular: Unable to define the apical impulse, regular rhythm, normal first and second heart sounds, no murmurs, rubs or gallops  Abdomen: no tenderness or distention, no masses by palpation, no abnormal pulsatility or arterial bruits, normal bowel sounds, no hepatosplenomegaly  Extremities: no clubbing, cyanosis or edema; 2+ radial, ulnar and brachial pulses bilaterally; 2+ right femoral, posterior tibial and dorsalis pedis pulses; 2+ left femoral, posterior tibial and dorsalis pedis pulses; no subclavian or femoral bruits  Neurological: grossly nonfocal   EKG: Sinus rhythm possible old inferior infarct   BMET    Component Value Date/Time   NA 139 12/18/2012 0849   K  4.3 12/18/2012 0849   CL 106 12/18/2012 0849   CO2 24 12/18/2012 0849   GLUCOSE 104* 12/18/2012 0849   BUN 11 12/18/2012 0849   CREATININE 0.90 12/18/2012 0849   CALCIUM 9.3 12/18/2012 0849     ASSESSMENT AND PLAN CAD (coronary artery disease) Currently asymptomatic , with history of excellent response to Ranexa, suggesting fixed coronary stenosis (? Ostial ramus intermedius lesion) in the past has also been suspected of having vasospasm. Continue same medications. Major coronary risk factor there remains uninterested as his obesity there is  Essential hypertension Well controlled  Hyperlipidemia On high-dose of a highly effective statin. His primary care physician he usually checks his lipids  Continue to encourage him to try to lose weight. Congratulated on compliance of sleep apnea therapy. We'll try to get a copy of his laboratory  results  Patient Instructions  Dr. Royann Shivers recommends that you schedule a follow-up appointment in: One Year.      Junious Silk, MD, [facility] [phone] office [phone] pager

## 2014-04-02 NOTE — Assessment & Plan Note (Signed)
Well controlled 

## 2014-04-02 NOTE — Assessment & Plan Note (Signed)
Currently asymptomatic , with history of excellent response to Ranexa, suggesting fixed coronary stenosis (? Ostial ramus intermedius lesion) in the past has also been suspected of having vasospasm. Continue same medications. Major coronary risk factor there remains uninterested as his obesity there is

## 2014-04-02 NOTE — Assessment & Plan Note (Signed)
On high-dose of a highly effective statin. His primary care physician he usually checks his lipids

## 2014-05-31 ENCOUNTER — Encounter: Payer: Self-pay | Admitting: Neurology

## 2014-05-31 ENCOUNTER — Ambulatory Visit (INDEPENDENT_AMBULATORY_CARE_PROVIDER_SITE_OTHER): Payer: BC Managed Care – PPO | Admitting: Neurology

## 2014-05-31 VITALS — BP 122/73 | HR 61 | Ht 73.0 in | Wt 316.0 lb

## 2014-05-31 DIAGNOSIS — M25562 Pain in left knee: Secondary | ICD-10-CM

## 2014-05-31 DIAGNOSIS — G609 Hereditary and idiopathic neuropathy, unspecified: Secondary | ICD-10-CM

## 2014-05-31 DIAGNOSIS — R600 Localized edema: Secondary | ICD-10-CM

## 2014-05-31 NOTE — Patient Instructions (Addendum)
We will do additional blood work today and call you with the results.  When you see Dr. Renne CriglerPharr next month, talk to him about taking a "hiatus" from Crestor to see if you feel better.  You may need to talk to Dr. Renne CriglerPharr about B12 shots.  I will see you routinely in 6 months, sooner if needed.

## 2014-05-31 NOTE — Progress Notes (Signed)
Subjective:    Patient ID: Craig Copeland is a 58 y.o. male.  HPI     Interim history:   Craig Copeland is a very pleasant 58 year old right-handed gentleman with an underlying complex medical history of coronary artery disease, status post MI and PTCA (10 years ago), morbid obesity, obstructive sleep apnea on CPAP, chronic leg edema, s/p L knee reconstruction, hyperlipidemia, hypertension, prediabetes, allergic rhinitis, and hypogonadism, status post cataract surgeries, left knee replacement surgery, shoulder arthroscopic surgery, who presents for follow-up consultation of his numbness. The patient is unaccompanied today. I first met him on 12/24/2013 at the request of his primary care physician, at which time the patient reported 6 month history of numbness in his fingertips and his feet. I suggested workup in the form of blood work and EMG nerve conduction testing. I also advised him to continue using his CPAP machine regularly. His blood test from 12/24/2013 showed normal TSH, normal ESR, normal RPR, normal B12 and folate, normal CK level, normal aldolase, normal A1c, normal ANA, normal CRP, normal rheumatoid factor, but mildly reduced vitamin D level at 23. We called him with the test results and advised him to start an over-the-counter oral vitamin D supplement. EMG nerve conduction testing on 01/20/2014 showed: Nerve conduction studies done on both upper extremities and on the left lower extremity reveals no evidence of a peripheral neuropathy. There does appear to be evidence of a mild right carpal tunnel syndrome. EMG evaluation of the left upper extremity is unremarkable, without evidence of an overlying cervical radiculopathy. EMG of the left lower extremity is unremarkable, without evidence of a lumbosacral radiculopathy. 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 We called him with the test  results.  Today, he reports feeling stable, unchanged. He still feels, that his symptoms may be from taking Crestor. He remembers that when he took Lipitor he had similar symptoms and these improved when he came off of Lipitor. He denies weakness or burning sensation or any type pain.  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 He quit smoking in 78. He does not drink caffeine. He drinks beer 2 times per week. He has no other complaints. Never had TIA or stroke symptoms, denying sudden onset of one sided weakness, numbness, tingling, slurring of speech or droopy face, hearing loss, tinnitus, diplopia or visual field cut or monocular loss of vision, and denies recurrent headaches. 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 His mother had polio and postpolio syndrome. There is no family history of neuropathy or muscle disease. He had similar symptoms a couple of years ago in his fingertips when he was on Lipitor and he was on it for 3-4 years and after he was switched to Crestor within 4-5 weeks.    His Past Medical History Is Significant For: Past Medical History  Diagnosis Date  . CAD (coronary artery disease)   . Vasospastic angina   . Systemic hypertension   . Dizziness   . Dyslipidemia   . OSA (obstructive sleep apnea)   . Obesity     His Past Surgical History Is Significant For: Past Surgical History  Procedure Laterality Date  .  Coronary angioplasty with stent placement  11/13/2002    mid RCA  . Cardiac catheterization  12/02/2002    <20% restenosis RCA  . Cardiac catheterization  01/30/2010    20-30% in-stent restenosis,60-70% ostial stenosis  . Knee surgery  2003  . Nose surgery    . Vasectomy      His Family History Is Significant For: Family  History  Problem Relation Age of Onset  . Heart failure Father   . Hypertension Father   . Cancer Paternal Grandfather   . Stroke Paternal Grandmother   . Heart failure Maternal Grandmother   . Alzheimer's disease Paternal Grandfather     His Social History Is Significant For: History   Social History  . Marital Status: Married    Spouse Name: N/A    Number of Children: 2  . Years of Education: 12th   Occupational History  .  [employer]   Social History Main Topics  . Smoking status: Never Smoker   . Smokeless tobacco: Never Used  . Alcohol Use: No  . Drug Use: No  . Sexual Activity: None   Other Topics Concern  . None   Social History Narrative   Patient is married with two children.   Patient is right handed.   Patient has 12th grade education.   Patient does not drink caffeine.       His Allergies Are:  No Known Allergies:   His Current Medications Are:  Outpatient Encounter Prescriptions as of 05/31/2014  Medication Sig  . amLODipine (NORVASC) 5 MG tablet Take 2 tablets (10 mg total) by mouth daily.  [name] aspirin 81 MG tablet Take 81 mg by mouth daily.  . clopidogrel (PLAVIX) 75 MG tablet Take 75 mg by mouth daily.  [name] co-enzyme Q-10 30 MG capsule Take 100 mg by mouth daily.   . fish oil-omega-3 fatty acids 1000 MG capsule Take 2 g by mouth daily.   [name] losartan (COZAAR) 100 MG tablet Take 0.5 tablets (50 mg total) by mouth daily.  . montelukast (SINGULAIR) 10 MG tablet Take 10 mg by mouth daily.  . nitroGLYCERIN (NITROSTAT) 0.4 MG SL tablet Place 1 tablet (0.4 mg total) under the tongue every 5 (five) minutes as needed for chest pain.  [name] RANEXA 1000 MG SR tablet TAKE 1 BY MOUTH TWICE DAILY  . rosuvastatin (CRESTOR) 20 MG tablet Take 10 mg by mouth daily.   [name] testosterone cypionate (DEPOTESTOTERONE CYPIONATE) 200 MG/ML injection Inject 200 mg into the muscle every 14 (fourteen) days.  [name] VIAGRA 100 MG tablet Take 1 tablet by mouth as needed.   :  Review of Systems:  Out of a complete 14 point review of systems, all are reviewed and negative with the exception of these symptoms as listed below:   Review of Systems  Constitutional: Positive for fatigue.  HENT: Positive for rhinorrhea.   Neurological: Positive for numbness.    Objective:  Neurologic Exam  Physical Exam Physical Examination:   There were no vitals filed for this visit.  General Examination: The patient is a very pleasant 58 y.o. male in no acute distress. He appears well-developed and well-nourished and well groomed.   HEENT: Normocephalic, atraumatic, pupils are equal, round and reactive to light and accommodation. Funduscopic exam is normal with sharp disc margins noted. He had cataract repair on the left. He has a mild cataract on the right. Extraocular tracking is good without limitation to gaze excursion or nystagmus noted. Normal smooth pursuit is  noted. Hearing is grossly intact. Face is symmetric with normal facial animation and normal facial sensation. Speech is clear with no dysarthria noted. There is no hypophonia. There is no lip, neck/head, jaw or voice tremor. Neck is supple with full range of passive and active motion. There are no carotid bruits on auscultation. Oropharynx exam reveals: mild mouth dryness, adequate dental hygiene and moderate airway crowding, due to narrow airway entry and thicker time. Mallampati is class II. Tongue protrudes centrally and palate elevates symmetrically. Tonsils are small in size. He has a Mild overbite.   Chest: Clear to auscultation without wheezing, rhonchi or crackles noted.  Heart: S1+S2+0, regular and normal without murmurs, rubs or gallops noted.   Abdomen: Soft, non-tender and non-distended with normal bowel sounds appreciated on auscultation.  Extremities: There is trace pitting edema in the distal lower extremities bilaterally, left more than right. Pedal pulses are intact.  Skin: Warm and dry without  trophic changes noted, except for sunburn to both distal legs with peeling. There are no varicose veins.  Musculoskeletal: exam reveals no obvious joint deformities, tenderness or joint swelling or erythema, except for s/p surgery L knee and decrease in ROM there.   Neurologically:  Mental status: The patient is awake, alert and oriented in all 4 spheres. His immediate and remote memory, attention, language skills and fund of knowledge are appropriate. There is no evidence of aphasia, agnosia, apraxia or anomia. Speech is clear with normal prosody and enunciation. Thought process is linear. Mood is normal and affect is normal.  Cranial nerves II - XII are as described above under HEENT exam. In addition: shoulder shrug is normal with equal shoulder height noted. Motor exam: Normal bulk, strength and tone is noted. There is no drift, tremor or rebound. Romberg is negative. Reflexes are 1-2+ throughout. Babinski: Toes are flexor bilaterally. Fine motor skills and coordination: intact with normal finger taps, normal hand movements, normal rapid alternating patting, normal foot taps and normal foot agility.  Cerebellar testing: No dysmetria or intention tremor on finger to nose testing. Heel to shin is unremarkable bilaterally. There is no truncal or gait ataxia.  Sensory exam: intact to light touch, pinprick, vibration, temperature sense and proprioception in the upper and lower extremities with the exception of perhaps slight  decrease in PP and temperature in the distal 2/3 of his fingers and in the b/l forefeet. He has no wasting or weakness, no fasciculations and no pain. Gait, station and balance: He stands easily. No veering to one side is noted. No leaning to one side is noted. Posture is age-appropriate and stance is narrow based. Gait shows normal stride length and normal pace. No problems turning are noted. He turns en bloc. Tandem walk is unremarkable.  Assessment and Plan:    In summary,  Craig Copeland is a very pleasant 58 year old male with an underlying complex medical history of coronary artery disease, status post MI and PTCA (10 years ago), morbid obesity, obstructive sleep apnea on CPAP, chronic leg edema, s/p L knee reconstruction, hyperlipidemia, hypertension, prediabetes, allergic rhinitis, and hypogonadism, status post cataract surgeries, left knee replacement surgery, shoulder arthroscopic surgery, who reports numbness of his hand and feet for the past 6-12 months. On examination he has evidence of decreased sensation to temperature and pinprick primarily in his distal upper and lower extremities primarily in his fingers and toes, unchanged. Proprioception and vibration seems to be fairly well preserved. He has no muscle wasting. He describes no actual pain or  weakness. Workup thus far does not show any treatable cause for neuropathy.  nevertheless, I will add more blood work today including vitamin E level, vitamin B1, protein electrophoresis and immune florescent electrophoresis as well as repeat B12 level and methylmalonic acid as his B12 level was on the low end of the spectrum at 239 when we checked it about 6 months ago. His EMG nerve conduction testing did not show any evidence of neuropathy but of course does not tend to pick up small fiber neuropathy. We talked about his blood test results and EMG nerve conduction test results today.  As far as his Crestor is concerned he is advised to talk to his primary care physician next month when he has his routine follow-up appointment. He may be able to take a "hiatus" from his cholesterol medicine to see if he actually improves. Furthermore, he may benefit from B12 injections as his level was borderline. We will call him with his blood test results. I will see him routinely in 6 months, sooner if needed.  As far as medications are concerned, I recommended the following at this time: no change at this point in time.  I answered all  his questions today and the patient was in agreement with the above outlined plan. I encouraged him to call with any interim questions, concerns, problems or updates.

## 2014-06-02 LAB — IFE AND PE, SERUM
ALBUMIN SERPL ELPH-MCNC: 3.9 g/dL (ref 3.2–5.6)
ALBUMIN/GLOB SERPL: 1.4 (ref 0.7–2.0)
Alpha 1: 0.1 g/dL (ref 0.1–0.4)
Alpha2 Glob SerPl Elph-Mcnc: 0.6 g/dL (ref 0.4–1.2)
B-GLOBULIN SERPL ELPH-MCNC: 0.9 g/dL (ref 0.6–1.3)
GAMMA GLOB SERPL ELPH-MCNC: 1.2 g/dL (ref 0.5–1.6)
Globulin, Total: 2.8 g/dL (ref 2.0–4.5)
IGG (IMMUNOGLOBIN G), SERUM: 1324 mg/dL (ref 700–1600)
IgA/Immunoglobulin A, Serum: 169 mg/dL (ref 91–414)
IgM (Immunoglobulin M), Srm: 38 mg/dL — ABNORMAL LOW (ref 40–230)
Total Protein: 6.7 g/dL (ref 6.0–8.5)

## 2014-06-02 LAB — B12 AND FOLATE PANEL
FOLATE: 16.5 ng/mL (ref >3.0)
Vitamin B-12: 235 pg/mL (ref 211–946)

## 2014-06-02 LAB — VITAMIN E: Vitamin E (Alpha Tocopherol): 9.3 mg/L (ref 4.6–17.8)

## 2014-06-02 LAB — VITAMIN B1, WHOLE BLOOD: THIAMINE: 158 nmol/L (ref 66.5–200.0)

## 2014-06-02 LAB — METHYLMALONIC ACID, SERUM: Methylmalonic Acid: 129 nmol/L (ref 0–378)

## 2014-06-07 ENCOUNTER — Telehealth: Payer: Self-pay | Admitting: Neurology

## 2014-06-07 ENCOUNTER — Other Ambulatory Visit: Payer: Self-pay | Admitting: Cardiovascular Disease

## 2014-06-07 MED ORDER — RANOLAZINE ER 1000 MG PO TB12: ORAL_TABLET | ORAL | Status: DC

## 2014-06-07 NOTE — Telephone Encounter (Signed)
He need a new prescription for Ranexa 1000 mg #90 and refills. Please send or call to [facility].He will also need a 10 days supply until this comes in,please call to [facility].

## 2014-06-07 NOTE — Progress Notes (Signed)
Quick Note:  Please advise patient that labs are normal. Vitamin B12 is noted to be on the lower end of the spectrum, but still in the normal range. Probably something he should be followed for by his primary care physician. No other action required: We check vitamin B12, folate, vitamin E, vitamin B1 and protein breakdown of his blood as well as methylmalonic acid all of which were normal. Huston FoleySaima Madelein Mahadeo, MD, PhD [facility] ([facility]  ______

## 2014-06-07 NOTE — Telephone Encounter (Signed)
Patient returning call regarding blood work results.

## 2014-06-07 NOTE — Telephone Encounter (Signed)
Rx was sent to pharmacy electronically to local & mail order pharmacies.

## 2014-06-08 NOTE — Telephone Encounter (Signed)
Called and share lab results with patient, he verbalized understanding

## 2014-06-10 ENCOUNTER — Encounter (HOSPITAL_COMMUNITY): Payer: Self-pay | Admitting: Cardiovascular Disease

## 2014-11-30 ENCOUNTER — Ambulatory Visit: Payer: BC Managed Care – PPO | Admitting: Neurology

## 2015-02-07 ENCOUNTER — Other Ambulatory Visit: Payer: Self-pay | Admitting: Cardiovascular Disease

## 2015-02-07 NOTE — Telephone Encounter (Signed)
Rx(s) sent to pharmacy electronically. OV 04/01/15

## 2015-04-01 ENCOUNTER — Ambulatory Visit (INDEPENDENT_AMBULATORY_CARE_PROVIDER_SITE_OTHER): Payer: BLUE CROSS/BLUE SHIELD | Admitting: Cardiovascular Disease

## 2015-04-01 ENCOUNTER — Encounter: Payer: Self-pay | Admitting: Cardiovascular Disease

## 2015-04-01 VITALS — BP 142/64 | HR 61 | Resp 16 | Ht 73.0 in | Wt 305.0 lb

## 2015-04-01 DIAGNOSIS — R079 Chest pain, unspecified: Secondary | ICD-10-CM | POA: Diagnosis not present

## 2015-04-01 DIAGNOSIS — I25118 Atherosclerotic heart disease of native coronary artery with other forms of angina pectoris: Secondary | ICD-10-CM

## 2015-04-01 DIAGNOSIS — R011 Cardiac murmur, unspecified: Secondary | ICD-10-CM | POA: Diagnosis not present

## 2015-04-01 DIAGNOSIS — E785 Hyperlipidemia, unspecified: Secondary | ICD-10-CM

## 2015-04-01 DIAGNOSIS — I10 Essential (primary) hypertension: Secondary | ICD-10-CM | POA: Diagnosis not present

## 2015-04-01 NOTE — Progress Notes (Signed)
Patient ID: Craig Copeland, male   DOB: 02-11-1956, 59 y.o.   MRN: [id number]     Cardiology Office Note   Date:  04/01/2015   ID:  Craig Copeland, DOB 1955/08/14, MRN [id number]  PCP:  [name], MD  Cardiologist:   Thurmon Fair, MD   Chief Complaint  Patient presents with  . Annual Exam  . Dizziness    A FEW WEEKS AGO  . Edema    FEET AND ANKLES   . Chest Pain  . Shortness of Breath      History of Present Illness: Craig Copeland is a 59 y.o. male who presents for follow-up for coronary artery disease, hypertension and hyperlipidemia.  Over the summer [name] has noticed return of angina pectoris. Has before this is described as precordial heaviness that radiates in the form of heaviness and tingling in his left arm. He has noticed that when he works hard and the weather is very hot. He has not had any symptoms at rest. 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 He had similar complaints with Lipitor in the past that resolved after he stopped taking that statin.   He has a history of previous stent to the right coronary artery(2004, drug-eluting 3.0 x 33 mm Cypher stent overlapping 3.0 x 23 mm Cypher stent in the mid to proximal right coronary artery) and a moderate to severe ostial lesion of a small to medium ramus intermedius artery (too small for percutaneous revascularization). 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  His last cardiac catheterization was in 2014, that showed findings identical to 2011 and 2004. He has normal left ventricle systolic function by echo most recently performed in June of 2014.  History of hypertension and hyperlipidemia. He is morbidly obese. He is compliant with CPAP for obstructive sleep apnea. He is on  chronic treatment with aspirin and clopidogrel.    Past Medical History  Diagnosis Date  . CAD (coronary artery disease)   . Vasospastic angina   . Systemic hypertension   . Dizziness   . Dyslipidemia   . OSA (obstructive sleep apnea)   . Obesity     Past Surgical History  Procedure Laterality Date  . Coronary angioplasty with stent placement  11/13/2002    mid RCA  . Cardiac catheterization  12/02/2002    <20% restenosis RCA  . Cardiac catheterization  01/30/2010    20-30% in-stent restenosis,60-70% ostial stenosis  . Knee surgery  2003  . Nose surgery    . Vasectomy    . Left heart catheterization with coronary angiogram N/A 12/22/2012    Procedure: LEFT HEART CATHETERIZATION WITH CORONARY ANGIOGRAM;  Surgeon: Thurmon Fair, MD;  Location: [facility] CATH LAB;  Service: Cardiovascular;  Laterality: N/A;     Current Outpatient Prescriptions  Medication Sig Dispense Refill  . amLODipine (NORVASC) 5 MG tablet Take 2 tablets (10 mg total) by mouth daily. 90 tablet 3  . aspirin 81 MG tablet Take 81 mg by mouth daily.    . clopidogrel (PLAVIX) 75 MG tablet Take 75 mg by mouth daily.    . fish oil-omega-3 fatty acids 1000 MG capsule Take 2 g by mouth daily.     [name] losartan (COZAAR) 100 MG tablet Take 0.5 tablets (50 mg total) by mouth daily.  90 tablet 3  . nitroGLYCERIN (NITROSTAT) 0.4 MG SL tablet Place 1 tablet (0.4 mg total) under the tongue every 5 (five) minutes as needed for chest pain. 25 tablet 6  . pravastatin (PRAVACHOL) 80 MG tablet Take 80 mg by mouth daily.    . ranolazine (RANEXA) 1000 MG SR tablet Take 1 tablet (1,000 mg total) by mouth 2 (two) times daily. 180 tablet 0  . VIAGRA 100 MG tablet Take 1 tablet by mouth as needed.     No current facility-administered medications for this visit.    Allergies:   Review of patient's allergies indicates no known allergies.    Social History:  The patient  reports that he has never smoked. He has never used smokeless tobacco. He  reports that he does not drink alcohol or use illicit drugs.   Family History:  The patient's family history includes Alzheimer's disease in his paternal grandfather; Cancer in his paternal grandfather; Heart failure in his father and maternal grandmother; Hypertension in his father; Stroke in his paternal grandmother.    ROS:  Please see the history of present illness.   He has mild ankle swelling that always resolves by the next morning. He denies palpitations or syncope or dizziness. He does not have exertional dyspnea. He has not taken Viagra in quite a while. Otherwise, review of systems positive for none.   All other systems are reviewed and negative.    PHYSICAL EXAM: VS:  BP 142/64 mmHg  Pulse 61  Resp 16  Ht  (1.854 m)  Wt 305 lb (138.347 kg)  BMI 40.25 kg/m2 , BMI Body mass index is 40.25 kg/(m^2).  General: Alert, oriented x3, no distress Head: no evidence of trauma, PERRL, EOMI, no exophtalmos or lid lag, no myxedema, no xanthelasma; normal ears, nose and oropharynx Neck: normal jugular venous pulsations and no hepatojugular reflux; brisk carotid pulses without delay and no carotid bruits Chest: clear to auscultation, no signs of consolidation by percussion or palpation, normal fremitus, symmetrical and full respiratory excursions Cardiovascular: normal position and quality of the apical impulse, regular rhythm, normal first and second heart sounds, grade 2/6 early peaking systolic ejection murmur heard at both the right and left upper sternal borders, no diastolic murmurs, rubs or gallops Abdomen: no tenderness or distention, no masses by palpation, no abnormal pulsatility or arterial bruits, normal bowel sounds, no hepatosplenomegaly Extremities: no clubbing, cyanosis or edema; 2+ radial, ulnar and brachial pulses bilaterally; 2+ right femoral, posterior tibial and dorsalis pedis pulses; 2+ left femoral, posterior tibial and dorsalis pedis pulses; no subclavian or femoral  bruits Neurological: grossly nonfocal Psych: euthymic mood, full affect   EKG:  EKG is ordered today. The ekg ordered today demonstrates normal sinus rhythm, old inferior wall myocardial infarction, no acute ST-T wave changes   Recent Labs: labs performed in June 2015 at [facility] medical, total cholesterol 142, triglycerides 91, HDL 35, LDL 89, hemoglobin 15.9, creatinine 1.2, normal electrolytes and liver function tests  Wt Readings from Last 3 Encounters:  04/01/15 305 lb (138.347 kg)  05/31/14 316 lb (143.337 kg)  03/29/14 309 lb 1.6 oz (140.207 kg)     ASSESSMENT AND PLAN:  1. CAD s/p PCI RCA. He appears to have recurrence of exertional angina pectoris despite taking 2 antianginal medications. Slow heart rate precludes use of beta blockers. Consider adding long-acting nitrates for medical therapy if there is no new major abnormality on his nuclear stress test, but may have to proceed with coronary angiography.  2. Hyperlipidemia. His LDL cholesterol was not quite at goal even when taking Crestor and I am skeptical that the current dose of pravastatin will be sufficient to bring his LDL to the target of 70 mg/deciliter or less. We'll repeat a lipid profile  3. Hypertension well controlled  4. Severe obesity he has lost a little bit of weight since last year but remains in the orbital be obese range. Continue weight loss efforts.  5. OSA compliant with CPAP   6. 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 Will check an echocardiogram.  Current medicines are reviewed at length with the patient today.  The patient does not have concerns regarding medicines.  The following changes have been made:  no change  Labs/ tests ordered today include:  Orders Placed This Encounter  Procedures  . Lipid panel  . Myocardial Perfusion Imaging  . EKG 12-Lead  . ECHOCARDIOGRAM COMPLETE    Patient Instructions  Medication Instructions:   CONTINUE SAME  MEDICATIONS  Labwork:  FASTING CHOLESTEROL PANEL  Testing/Procedures:  Your physician has requested that you have an echocardiogram. Echocardiography is a painless test that uses sound waves to create images of your heart. It provides your doctor with information about the size and shape of your heart and how well your heart's chambers and valves are working. This procedure takes approximately one hour. There are no restrictions for this procedure.  Your physician has requested that you have en exercise stress myoview. For further information please visit [email or web address] Please follow instruction sheet, as given.    Follow-Up:  Dr. Royann Shivers recommends that you schedule a follow-up appointment in:   4 MONTHS           Signed, Adryen Cookson, MD  04/01/2015 11:51 AM    Thurmon Fair, MD, [facility] [phone] office [phone] pager

## 2015-04-01 NOTE — Patient Instructions (Signed)
Medication Instructions:   CONTINUE SAME MEDICATIONS  Labwork:  FASTING CHOLESTEROL PANEL  Testing/Procedures:  Your physician has requested that you have an echocardiogram. Echocardiography is a painless test that uses sound waves to create images of your heart. It provides your doctor with information about the size and shape of your heart and how well your heart's chambers and valves are working. This procedure takes approximately one hour. There are no restrictions for this procedure.  Your physician has requested that you have en exercise stress myoview. For further information please visit [email or web address] Please follow instruction sheet, as given.    Follow-Up:  Dr. Royann Shivers recommends that you schedule a follow-up appointment in:   4 MONTHS

## 2015-04-09 LAB — LIPID PANEL
Cholesterol: 137 mg/dL (ref 125–200)
HDL: 32 mg/dL — ABNORMAL LOW
LDL Cholesterol: 74 mg/dL
Total CHOL/HDL Ratio: 4.3 ratio
Triglycerides: 157 mg/dL — ABNORMAL HIGH
VLDL: 31 mg/dL — ABNORMAL HIGH

## 2015-04-11 ENCOUNTER — Telehealth (HOSPITAL_COMMUNITY): Payer: Self-pay | Admitting: *Deleted

## 2015-04-11 ENCOUNTER — Telehealth: Payer: Self-pay | Admitting: Cardiovascular Disease

## 2015-04-11 NOTE — Telephone Encounter (Addendum)
Craig Copeland spoke to patient regarding results.

## 2015-04-11 NOTE — Telephone Encounter (Signed)
Pt called in stating that a nurse called him in regards to some results for blood work  Thanks

## 2015-04-11 NOTE — Telephone Encounter (Signed)
Patient given detailed instructions per Myocardial Perfusion Study Information Sheet for test on 04/13/15 at 1230. Patient notified to arrive 15 minutes early and that it is imperative to arrive on time for appointment to keep from having the test rescheduled.  If you need to cancel or reschedule your appointment, please call the office within 24 hours of your appointment. Failure to do so may result in a cancellation of your appointment, and a $50 no show fee. Patient verbalized understanding. Laksh Hinners, Adelene Idler

## 2015-04-13 ENCOUNTER — Ambulatory Visit (HOSPITAL_BASED_OUTPATIENT_CLINIC_OR_DEPARTMENT_OTHER): Payer: BLUE CROSS/BLUE SHIELD

## 2015-04-13 ENCOUNTER — Other Ambulatory Visit: Payer: Self-pay

## 2015-04-13 ENCOUNTER — Ambulatory Visit (HOSPITAL_COMMUNITY): Payer: BLUE CROSS/BLUE SHIELD | Attending: Cardiovascular Disease

## 2015-04-13 DIAGNOSIS — R0602 Shortness of breath: Secondary | ICD-10-CM | POA: Diagnosis not present

## 2015-04-13 DIAGNOSIS — R011 Cardiac murmur, unspecified: Secondary | ICD-10-CM | POA: Insufficient documentation

## 2015-04-13 DIAGNOSIS — I10 Essential (primary) hypertension: Secondary | ICD-10-CM | POA: Diagnosis not present

## 2015-04-13 DIAGNOSIS — I517 Cardiomegaly: Secondary | ICD-10-CM | POA: Insufficient documentation

## 2015-04-13 DIAGNOSIS — R079 Chest pain, unspecified: Secondary | ICD-10-CM | POA: Insufficient documentation

## 2015-04-13 DIAGNOSIS — I340 Nonrheumatic mitral (valve) insufficiency: Secondary | ICD-10-CM | POA: Insufficient documentation

## 2015-04-13 DIAGNOSIS — I358 Other nonrheumatic aortic valve disorders: Secondary | ICD-10-CM | POA: Insufficient documentation

## 2015-04-13 MED ORDER — PERFLUTREN LIPID MICROSPHERE
2.0000 mL | Freq: Once | INTRAVENOUS | Status: AC
  Administered 2015-04-13: 2 mL via INTRAVENOUS

## 2015-04-13 MED ORDER — TECHNETIUM TC 99M SESTAMIBI GENERIC - CARDIOLITE
31.6000 | Freq: Once | INTRAVENOUS | Status: AC | PRN
  Administered 2015-04-13: 32 via INTRAVENOUS

## 2015-04-13 MED ORDER — REGADENOSON 0.4 MG/5ML IV SOLN
0.4000 mg | Freq: Once | INTRAVENOUS | Status: AC
  Administered 2015-04-13: 0.4 mg via INTRAVENOUS

## 2015-04-14 ENCOUNTER — Ambulatory Visit (HOSPITAL_COMMUNITY): Payer: BLUE CROSS/BLUE SHIELD | Attending: Cardiology

## 2015-04-14 DIAGNOSIS — R0989 Other specified symptoms and signs involving the circulatory and respiratory systems: Secondary | ICD-10-CM

## 2015-04-14 LAB — MYOCARDIAL PERFUSION IMAGING
CHL CUP NUCLEAR SDS: 2
CHL CUP NUCLEAR SRS: 1
CSEPPHR: 90 {beats}/min
LV dias vol: 170 mL
LV sys vol: 68 mL
RATE: 0.33
Rest HR: 64 {beats}/min
SSS: 3
TID: 0.99

## 2015-04-14 MED ORDER — TECHNETIUM TC 99M SESTAMIBI GENERIC - CARDIOLITE
30.7000 | Freq: Once | INTRAVENOUS | Status: AC | PRN
  Administered 2015-04-14: 30.7 via INTRAVENOUS

## 2015-04-14 NOTE — Progress Notes (Signed)
[facility]

## 2015-04-20 ENCOUNTER — Encounter (HOSPITAL_COMMUNITY): Payer: BLUE CROSS/BLUE SHIELD

## 2015-04-20 ENCOUNTER — Other Ambulatory Visit (HOSPITAL_COMMUNITY): Payer: BLUE CROSS/BLUE SHIELD

## 2015-05-31 ENCOUNTER — Other Ambulatory Visit: Payer: Self-pay | Admitting: Cardiovascular Disease

## 2015-05-31 MED ORDER — NITROGLYCERIN 0.4 MG SL SUBL: 0.4000 mg | SUBLINGUAL_TABLET | SUBLINGUAL | Status: DC | PRN

## 2015-06-08 ENCOUNTER — Other Ambulatory Visit: Payer: Self-pay | Admitting: Cardiovascular Disease

## 2015-06-08 MED ORDER — NITROGLYCERIN 0.4 MG SL SUBL: 0.4000 mg | SUBLINGUAL_TABLET | SUBLINGUAL | Status: DC | PRN

## 2015-09-19 ENCOUNTER — Other Ambulatory Visit: Payer: Self-pay | Admitting: Cardiovascular Disease

## 2015-09-19 NOTE — Telephone Encounter (Signed)
Rx(s) sent to pharmacy electronically.  

## 2015-12-29 ENCOUNTER — Other Ambulatory Visit: Payer: Self-pay

## 2015-12-29 MED ORDER — RANOLAZINE ER 1000 MG PO TB12: 1000.0000 mg | ORAL_TABLET | Freq: Two times a day (BID) | ORAL | Status: DC

## 2015-12-29 NOTE — Telephone Encounter (Signed)
Rx(s) sent to pharmacy electronically.  

## 2016-02-14 ENCOUNTER — Telehealth: Payer: Self-pay

## 2016-02-14 NOTE — Telephone Encounter (Signed)
Received progress note from Dr Renne CriglerPharr. Per [facility] patient needs follow-up in September. [email or web address]

## 2016-03-15 ENCOUNTER — Other Ambulatory Visit: Payer: Self-pay | Admitting: Cardiovascular Disease

## 2016-03-30 NOTE — Telephone Encounter (Signed)
Patient has been contacted. Did not want to schedule at time of call.

## 2016-06-01 DIAGNOSIS — J111 Influenza due to unidentified influenza virus with other respiratory manifestations: Secondary | ICD-10-CM

## 2016-06-01 HISTORY — DX: Influenza due to unidentified influenza virus with other respiratory manifestations: J11.1

## 2016-08-06 DIAGNOSIS — J45909 Unspecified asthma, uncomplicated: Secondary | ICD-10-CM | POA: Diagnosis not present

## 2016-08-06 DIAGNOSIS — R0789 Other chest pain: Secondary | ICD-10-CM | POA: Diagnosis not present

## 2016-08-06 DIAGNOSIS — J069 Acute upper respiratory infection, unspecified: Secondary | ICD-10-CM | POA: Diagnosis not present

## 2016-08-06 DIAGNOSIS — R509 Fever, unspecified: Secondary | ICD-10-CM | POA: Diagnosis not present

## 2016-08-09 DIAGNOSIS — J189 Pneumonia, unspecified organism: Secondary | ICD-10-CM | POA: Diagnosis not present

## 2016-08-09 DIAGNOSIS — J45909 Unspecified asthma, uncomplicated: Secondary | ICD-10-CM | POA: Diagnosis not present

## 2016-08-14 DIAGNOSIS — E7800 Pure hypercholesterolemia, unspecified: Secondary | ICD-10-CM | POA: Diagnosis not present

## 2016-08-16 DIAGNOSIS — I208 Other forms of angina pectoris: Secondary | ICD-10-CM | POA: Diagnosis not present

## 2016-08-16 DIAGNOSIS — E782 Mixed hyperlipidemia: Secondary | ICD-10-CM | POA: Diagnosis not present

## 2016-08-16 DIAGNOSIS — J159 Unspecified bacterial pneumonia: Secondary | ICD-10-CM | POA: Diagnosis not present

## 2016-08-20 ENCOUNTER — Telehealth: Payer: Self-pay | Admitting: Physician Assistant

## 2016-08-20 NOTE — Telephone Encounter (Signed)
[facility] 08/28/26 with Theodore Demarkhonda Barrett, PA.  Records put with Rhonda's schedule for 08/28/16. lp

## 2016-08-28 ENCOUNTER — Encounter: Payer: Self-pay | Admitting: Physician Assistant

## 2016-08-28 ENCOUNTER — Ambulatory Visit (INDEPENDENT_AMBULATORY_CARE_PROVIDER_SITE_OTHER): Payer: 59 | Admitting: Physician Assistant

## 2016-08-28 VITALS — BP 121/76 | HR 66 | Ht 73.0 in | Wt 302.8 lb

## 2016-08-28 DIAGNOSIS — I10 Essential (primary) hypertension: Secondary | ICD-10-CM | POA: Diagnosis not present

## 2016-08-28 DIAGNOSIS — R079 Chest pain, unspecified: Secondary | ICD-10-CM | POA: Diagnosis not present

## 2016-08-28 DIAGNOSIS — I25118 Atherosclerotic heart disease of native coronary artery with other forms of angina pectoris: Secondary | ICD-10-CM | POA: Diagnosis not present

## 2016-08-28 DIAGNOSIS — Z01812 Encounter for preprocedural laboratory examination: Secondary | ICD-10-CM | POA: Diagnosis not present

## 2016-08-28 NOTE — Progress Notes (Signed)
Agree with Theodore Demarkhonda Barrett, PA-C

## 2016-08-28 NOTE — Progress Notes (Signed)
Cardiology Office Note   Date:  08/28/2016   ID:  Craig Copeland, DOB Jan 31, 1956, MRN [id number]  PCP:  Craig MohPHARR,[name] DAVIDSON, MD  Cardiologist:  Dr. Royann Copeland, 04/01/2015  Craig Copeland, Craig Fofana, PA-C   Chief Complaint  Patient presents with  . Follow-up    Chest Pain after working all day, occassionally. Pt c/o SOB, Swelling in legs    History of Present Illness: Craig Copeland is a 61 y.o. male with a history of RCA stent 2004, patent by cath 2014, HTN, HLD, OSA, vasospastic angina  Craig Copeland presents for cardiology evaluation.  He got the flu after Christmas and again in January, that time with pneumonia. He did a Zpack the first of February. He still has a little cough. He still feels increased DOE, not back to baseline yet. He gets some daytime pedal edema, does not wake with it. No orthopnea or PND since he completed ABX.  Even before he got the flu, he was having chest tightness. 3-4/10 at its worst. Relieved by rest in 45-60 minutes. Aspirin 81 mg would help. He had SL NTG but rarely took it. The last time was October. He does not take the nitro with the chest pain because of the HA.   If he does heavier work, he will get SOB and then get CP. He has also had CP at rest after a day of physical exertion. If it starts at rest, it is not as severe. He gets this about 3 x week. He feels the pain is consistent with exertion. The episodes resolve with time but it can take 30-45 minutes. He is limiting his activity level to minimize the chest pain.  He has also noted increased fatigue and is sleeping more. He has not been using CPAP because of the flu, hopes to be cleared to start back soon.    Past Medical History:  Diagnosis Date  . CAD (coronary artery disease)   . Dizziness   . Dyslipidemia   . Flu 06/2016   Had again 01/208 with PNA  . Obesity   . OSA (obstructive sleep apnea)   . Systemic hypertension   . Vasospastic angina [facility](HCC)     Past Surgical History:   Procedure Laterality Date  . CARDIAC CATHETERIZATION  12/02/2002   <20% restenosis RCA  . CARDIAC CATHETERIZATION  01/30/2010   20-30% in-stent restenosis,60-70% ostial stenosis  . CORONARY ANGIOPLASTY WITH STENT PLACEMENT  11/13/2002   mid RCA  . KNEE SURGERY  2003  . LEFT HEART CATHETERIZATION WITH CORONARY ANGIOGRAM N/A 12/22/2012   Procedure: LEFT HEART CATHETERIZATION WITH CORONARY ANGIOGRAM;  Surgeon: Craig FairMihai Croitoru, MD;  Location: [facility] CATH LAB;  Service: Cardiovascular;  Laterality: N/A;  . NOSE SURGERY    . VASECTOMY      Medication Sig  . amLODipine (NORVASC) 5 MG tablet Take 2 tablets (10 mg total) by mouth daily.  [name]. aspirin 81 MG tablet Take 81 mg by mouth daily.  . clopidogrel (PLAVIX) 75 MG tablet Take 75 mg by mouth daily.  . fish oil-omega-3 fatty acids 1000 MG capsule Take 2 g by mouth daily.   [name]. losartan (COZAAR) 100 MG tablet Take 0.5 tablets (50 mg total) by mouth daily.  . nitroGLYCERIN (NITROSTAT) 0.4 MG SL tablet Place 1 tablet (0.4 mg total) under the tongue every 5 (five) minutes as needed for chest pain.  . pravastatin (PRAVACHOL) 80 MG tablet Take 80 mg by mouth daily.  . ranolazine (RANEXA) 1000 MG SR  tablet Take 1 tablet (1,000 mg total) by mouth 2 (two) times daily.  . ranolazine (RANEXA) 1000 MG SR tablet Take 1 tablet (1,000 mg total) by mouth 2 (two) times daily.  [name] VIAGRA 100 MG tablet Take 1 tablet by mouth as needed.   No current facility-administered medications for this visit.     Allergies:   Patient has no known allergies.    Social History:  The patient  reports that he has never smoked. He has never used smokeless tobacco. He reports that he does not drink alcohol or use drugs.   Family History:  The patient's family history includes Alzheimer's disease in his paternal grandfather; Cancer in his paternal grandfather; Heart failure in his father and maternal grandmother; Hypertension in his father; Stroke in his paternal grandmother.    ROS:   Please see the history of present illness. All other systems are reviewed and negative.    PHYSICAL EXAM: VS:  BP 121/76   Pulse 66   Ht 6\' 1"  (1.854 m)   Wt (!) 302 lb 12.8 oz (137.3 kg)   BMI 39.95 kg/m  , BMI Body mass index is 39.95 kg/m. GEN: Well nourished, well developed, male in no acute distress  HEENT: normal for age  Neck: no JVD, no carotid bruit, no masses Cardiac: RRR; soft murmur, no rubs, or gallops Respiratory:  clear to auscultation bilaterally, normal work of breathing GI: soft, nontender, nondistended, + BS MS: no deformity or atrophy; no edema; distal pulses are 2+ in all 4 extremities   Skin: warm and dry, no rash Neuro:  Strength and sensation are intact Psych: euthymic mood, full affect   EKG:  EKG is ordered today. The ekg ordered today demonstrates sinus rhythm with first-degree AV block, heart rate 63, PR interval 230 ms   Recent Labs: No results found for requested labs within last 8760 hours.    Lipid Panel    Component Value Date/Time   CHOL 137 04/08/2015 0804   TRIG 157 (H) 04/08/2015 0804   HDL 32 (L) 04/08/2015 0804   CHOLHDL 4.3 04/08/2015 0804   VLDL 31 (H) 04/08/2015 0804   LDLCALC 74 04/08/2015 0804     Wt Readings from Last 3 Encounters:  08/28/16 (!) 302 lb 12.8 oz (137.3 kg)  04/13/15 (!) 305 lb (138.3 kg)  04/01/15 (!) 305 lb (138.3 kg)     Other studies Reviewed: Additional studies/ records that were reviewed today include: Office notes, hospital records and testing.  ASSESSMENT AND PLAN: The patient was discussed with Dr. Royann Shivers who agrees with the plan  1.  Unstable anginal pain: He describes exertional chest pain or shortness of breath that he states is consistent with exertion. He has also had a couple of episodes at rest, after a long day of work. Because of headaches, he has not used sublingual nitroglycerin. He is compliant with other medications.  Because he has symptoms both at rest and with exertion and has  a history of coronary artery disease with his last PCI over 10 years ago, cardiac catheterization is indicated.  The risks and benefits of a cardiac catheterization including, but not limited to, death, stroke, MI, kidney damage and bleeding were discussed with the patient who indicates understanding and agrees to proceed.    Current medicines are reviewed at length with the patient today.  The patient does not have concerns regarding medicines.  The following changes have been made:  no change, the extra ranolazine is a duplicate  Labs/ tests ordered today include:   Orders Placed This Encounter  Procedures  . APTT  . CBC  . Basic metabolic panel  . Protime-INR  . EKG 12-Lead  . LEFT HEART CATHETERIZATION WITH CORONARY ANGIOGRAM     Disposition:   FU with Dr. Royann Shivers  Signed, Craig Demark, PA-C  08/28/2016 11:51 AM    [facility] Phone: [phone]; Fax: [phone]  This note was written with the assistance of speech recognition software. Please excuse any transcriptional errors.

## 2016-08-28 NOTE — Patient Instructions (Addendum)
Your physician has requested that you have a cardiac catheterization tomorrow. Cardiac catheterization is used to diagnose and/or treat various heart conditions. Doctors may recommend this procedure for a number of different reasons. The most common reason is to evaluate chest pain. Chest pain can be a symptom of coronary artery disease (CAD), and cardiac catheterization can show whether plaque is narrowing or blocking your heart's arteries. This procedure is also used to evaluate the valves, as well as measure the blood flow and oxygen levels in different parts of your heart. For further information please visit [email or web address].   Following your catheterization, you will not be allowed to drive for 3 days.  No lifting, pushing, or pulling greater that 10 pounds is allowed for 1 week.  You will be required to have the following tests prior to the procedure:  1. Blood work- TODAY

## 2016-08-28 NOTE — H&P (Signed)
Cardiology History and Physical   Date:  08/28/2016   ID:  Craig FisherMichael K Copeland, DOB 1955/08/22, MRN [id number]  PCP:  Londell MohPHARR,[name] DAVIDSON, MD      Cardiologist:  Dr. Royann Shiversroitoru, 04/01/2015  [name], Rhonda, PA-C       Chief Complaint  Patient presents with  . Follow-up    Chest Pain after working all day, occassionally. Pt c/o SOB, Swelling in legs    History of Present Illness: Craig FisherMichael K Copeland is a 61 y.o. male with a history of RCA stent 2004, patent by cath 2014, HTN, HLD, OSA, vasospastic angina  Craig FisherMichael K Copeland presents for cardiology evaluation.  He got the flu after Christmas and again in January, that time with pneumonia. He did a Zpack the first of February. He still has a little cough. He still feels increased DOE, not back to baseline yet. He gets some daytime pedal edema, does not wake with it. No orthopnea or PND since he completed ABX.  Even before he got the flu, he was having chest tightness. 3-4/10 at its worst. Relieved by rest in 45-60 minutes. Aspirin 81 mg would help. He had SL NTG but rarely took it. The last time was October. He does not take the nitro with the chest pain because of the HA.   If he does heavier work, he will get SOB and then get CP. He has also had CP at rest after a day of physical exertion. If it starts at rest, it is not as severe. He gets this about 3 x week. He feels the pain is consistent with exertion. The episodes resolve with time but it can take 30-45 minutes. He is limiting his activity level to minimize the chest pain.  He has also noted increased fatigue and is sleeping more. He has not been using CPAP because of the flu, hopes to be cleared to start back soon.        Past Medical History:  Diagnosis Date  . CAD (coronary artery disease)   . Dizziness   . Dyslipidemia   . Flu 06/2016   Had again 01/208 with PNA  . Obesity   . OSA (obstructive sleep apnea)   . Systemic hypertension   .  Vasospastic angina [facility](HCC)          Past Surgical History:  Procedure Laterality Date  . CARDIAC CATHETERIZATION  12/02/2002   <20% restenosis RCA  . CARDIAC CATHETERIZATION  01/30/2010   20-30% in-stent restenosis,60-70% ostial stenosis  . CORONARY ANGIOPLASTY WITH STENT PLACEMENT  11/13/2002   mid RCA  . KNEE SURGERY  2003  . LEFT HEART CATHETERIZATION WITH CORONARY ANGIOGRAM N/A 12/22/2012   Procedure: LEFT HEART CATHETERIZATION WITH CORONARY ANGIOGRAM;  Surgeon: Thurmon FairMihai Croitoru, MD;  Location: [facility] CATH LAB;  Service: Cardiovascular;  Laterality: N/A;  . NOSE SURGERY    . VASECTOMY          Medication Sig  . amLODipine (NORVASC) 5 MG tablet Take 2 tablets (10 mg total) by mouth daily.  [name]. aspirin 81 MG tablet Take 81 mg by mouth daily.  . clopidogrel (PLAVIX) 75 MG tablet Take 75 mg by mouth daily.  . fish oil-omega-3 fatty acids 1000 MG capsule Take 2 g by mouth daily.   [name]. losartan (COZAAR) 100 MG tablet Take 0.5 tablets (50 mg total) by mouth daily.  . nitroGLYCERIN (NITROSTAT) 0.4 MG SL tablet Place 1 tablet (0.4 mg total) under the tongue every 5 (five) minutes as needed for  chest pain.  . pravastatin (PRAVACHOL) 80 MG tablet Take 80 mg by mouth daily.  . ranolazine (RANEXA) 1000 MG SR tablet Take 1 tablet (1,000 mg total) by mouth 2 (two) times daily.  . ranolazine (RANEXA) 1000 MG SR tablet Take 1 tablet (1,000 mg total) by mouth 2 (two) times daily.  [name] VIAGRA 100 MG tablet Take 1 tablet by mouth as needed.   No current facility-administered medications for this visit.     Allergies:   Patient has no known allergies.    Social History:  The patient  reports that he has never smoked. He has never used smokeless tobacco. He reports that he does not drink alcohol or use drugs.   Family History:  The patient's family history includes Alzheimer's disease in his paternal grandfather; Cancer in his paternal grandfather; Heart failure in his father and maternal  grandmother; Hypertension in his father; Stroke in his paternal grandmother.    ROS:  Please see the history of present illness. All other systems are reviewed and negative.    PHYSICAL EXAM: VS:  BP 121/76   Pulse 66   Ht 6\' 1"  (1.854 m)   Wt (!) 302 lb 12.8 oz (137.3 kg)   BMI 39.95 kg/m  , BMI Body mass index is 39.95 kg/m. GEN: Well nourished, well developed, male in no acute distress  HEENT: normal for age  Neck: no JVD, no carotid bruit, no masses Cardiac: RRR; soft murmur, no rubs, or gallops Respiratory:  clear to auscultation bilaterally, normal work of breathing GI: soft, nontender, nondistended, + BS MS: no deformity or atrophy; no edema; distal pulses are 2+ in all 4 extremities   Skin: warm and dry, no rash Neuro:  Strength and sensation are intact Psych: euthymic mood, full affect   EKG:  EKG is ordered today. The ekg ordered today demonstrates sinus rhythm with first-degree AV block, heart rate 63, PR interval 230 ms   Recent Labs: No results found for requested labs within last 8760 hours.    Lipid Panel Labs (Brief)          Component Value Date/Time   CHOL 137 04/08/2015 0804   TRIG 157 (H) 04/08/2015 0804   HDL 32 (L) 04/08/2015 0804   CHOLHDL 4.3 04/08/2015 0804   VLDL 31 (H) 04/08/2015 0804   LDLCALC 74 04/08/2015 0804          Wt Readings from Last 3 Encounters:  08/28/16 (!) 302 lb 12.8 oz (137.3 kg)  04/13/15 (!) 305 lb (138.3 kg)  04/01/15 (!) 305 lb (138.3 kg)     Other studies Reviewed: Additional studies/ records that were reviewed today include: Office notes, hospital records and testing.  ASSESSMENT AND PLAN: The patient was discussed with Dr. Royann Shivers who agrees with the plan  1.  Unstable anginal pain: He describes exertional chest pain or shortness of breath that he states is consistent with exertion. He has also had a couple of episodes at rest, after a long day of work. Because of headaches, he has not  used sublingual nitroglycerin. He is compliant with other medications.  Because he has symptoms both at rest and with exertion and has a history of coronary artery disease with his last PCI over 10 years ago, cardiac catheterization is indicated.  The risks and benefits of a cardiac catheterization including, but not limited to, death, stroke, MI, kidney damage and bleeding were discussed with the patient who indicates understanding and agrees to proceed.  Current medicines are reviewed at length with the patient today.  The patient does not have concerns regarding medicines.  The following changes have been made:  no change, the extra ranolazine is a duplicate   Labs/ tests ordered today include:      Orders Placed This Encounter  Procedures  . APTT  . CBC  . Basic metabolic panel  . Protime-INR  . EKG 12-Lead  . LEFT HEART CATHETERIZATION WITH CORONARY ANGIOGRAM     Disposition:   FU with Dr. Royann Shivers  Signed, Theodore Demark, PA-C  08/28/2016 11:51 AM    [facility] Phone: [phone]; Fax: [phone]  This note was written with the assistance of speech recognition software. Please excuse any transcriptional errors.  Pt seen by Theodore Demark, PA-C in the office and cath arranged. I was not present for the visit in the office.    Verne Carrow 08/29/2016 8:28 AM

## 2016-08-31 LAB — CBC
HCT: 41.6 % (ref 38.5–50.0)
HEMOGLOBIN: 13.7 g/dL (ref 13.2–17.1)
MCH: 30 pg (ref 27.0–33.0)
MCHC: 32.9 g/dL (ref 32.0–36.0)
MCV: 91 fL (ref 80.0–100.0)
MPV: 9.6 fL (ref 7.5–12.5)
PLATELETS: 174 10*3/uL (ref 140–400)
RBC: 4.57 MIL/uL (ref 4.20–5.80)
RDW: 14.3 % (ref 11.0–15.0)
WBC: 5.4 10*3/uL (ref 3.8–10.8)

## 2016-08-31 LAB — BASIC METABOLIC PANEL
BUN: 17 mg/dL (ref 7–25)
CHLORIDE: 106 mmol/L (ref 98–110)
CO2: 21 mmol/L (ref 20–31)
CREATININE: 1.13 mg/dL (ref 0.70–1.33)
Calcium: 8.9 mg/dL (ref 8.6–10.3)
GLUCOSE: 107 mg/dL — AB (ref 65–99)
Potassium: 4.1 mmol/L (ref 3.5–5.3)
SODIUM: 139 mmol/L (ref 135–146)

## 2016-09-01 LAB — PROTIME-INR
INR: 1
Prothrombin Time: 11 s (ref 9.0–11.5)

## 2016-09-01 LAB — APTT: aPTT: 29 s (ref 22–34)

## 2016-09-02 DIAGNOSIS — G8911 Acute pain due to trauma: Secondary | ICD-10-CM | POA: Diagnosis not present

## 2016-09-02 DIAGNOSIS — M25562 Pain in left knee: Secondary | ICD-10-CM | POA: Diagnosis not present

## 2016-09-02 DIAGNOSIS — S8002XA Contusion of left knee, initial encounter: Secondary | ICD-10-CM | POA: Diagnosis not present

## 2016-09-05 ENCOUNTER — Encounter (HOSPITAL_COMMUNITY): Payer: Self-pay | Admitting: Cardiovascular Disease

## 2016-09-05 ENCOUNTER — Ambulatory Visit (HOSPITAL_COMMUNITY): Payer: 59

## 2016-09-05 ENCOUNTER — Ambulatory Visit (HOSPITAL_COMMUNITY)
Admission: RE | Admit: 2016-09-05 | Discharge: 2016-09-05 | Disposition: A | Discharge status: Home / Self Care | Payer: 59 | Source: Ambulatory Visit | Attending: Cardiovascular Disease | Admitting: Cardiovascular Disease

## 2016-09-05 ENCOUNTER — Encounter (HOSPITAL_COMMUNITY)
Admission: RE | Disposition: A | Discharge status: Home / Self Care | Payer: Self-pay | Source: Ambulatory Visit | Attending: Cardiovascular Disease

## 2016-09-05 DIAGNOSIS — I2510 Atherosclerotic heart disease of native coronary artery without angina pectoris: Secondary | ICD-10-CM

## 2016-09-05 DIAGNOSIS — G4733 Obstructive sleep apnea (adult) (pediatric): Secondary | ICD-10-CM | POA: Insufficient documentation

## 2016-09-05 DIAGNOSIS — Z7982 Long term (current) use of aspirin: Secondary | ICD-10-CM | POA: Insufficient documentation

## 2016-09-05 DIAGNOSIS — I25110 Atherosclerotic heart disease of native coronary artery with unstable angina pectoris: Secondary | ICD-10-CM | POA: Insufficient documentation

## 2016-09-05 DIAGNOSIS — E669 Obesity, unspecified: Secondary | ICD-10-CM | POA: Insufficient documentation

## 2016-09-05 DIAGNOSIS — Z6839 Body mass index (BMI) 39.0-39.9, adult: Secondary | ICD-10-CM | POA: Insufficient documentation

## 2016-09-05 DIAGNOSIS — I10 Essential (primary) hypertension: Secondary | ICD-10-CM | POA: Diagnosis not present

## 2016-09-05 DIAGNOSIS — Z823 Family history of stroke: Secondary | ICD-10-CM | POA: Insufficient documentation

## 2016-09-05 DIAGNOSIS — J189 Pneumonia, unspecified organism: Secondary | ICD-10-CM | POA: Diagnosis not present

## 2016-09-05 DIAGNOSIS — Z955 Presence of coronary angioplasty implant and graft: Secondary | ICD-10-CM | POA: Diagnosis not present

## 2016-09-05 DIAGNOSIS — Z8249 Family history of ischemic heart disease and other diseases of the circulatory system: Secondary | ICD-10-CM | POA: Insufficient documentation

## 2016-09-05 DIAGNOSIS — R079 Chest pain, unspecified: Secondary | ICD-10-CM | POA: Diagnosis present

## 2016-09-05 DIAGNOSIS — E785 Hyperlipidemia, unspecified: Secondary | ICD-10-CM | POA: Insufficient documentation

## 2016-09-05 DIAGNOSIS — I25118 Atherosclerotic heart disease of native coronary artery with other forms of angina pectoris: Secondary | ICD-10-CM

## 2016-09-05 DIAGNOSIS — I440 Atrioventricular block, first degree: Secondary | ICD-10-CM | POA: Diagnosis not present

## 2016-09-05 DIAGNOSIS — Z7902 Long term (current) use of antithrombotics/antiplatelets: Secondary | ICD-10-CM | POA: Diagnosis not present

## 2016-09-05 HISTORY — PX: LEFT HEART CATH AND CORONARY ANGIOGRAPHY: CATH118249

## 2016-09-05 SURGERY — LEFT HEART CATH AND CORONARY ANGIOGRAPHY

## 2016-09-05 MED ORDER — IOPAMIDOL (ISOVUE-370) INJECTION 76%
INTRAVENOUS | Status: AC
  Filled 2016-09-05: qty 100

## 2016-09-05 MED ORDER — SODIUM CHLORIDE 0.9 % WEIGHT BASED INFUSION: 1.0000 mL/kg/h | INTRAVENOUS | Status: DC

## 2016-09-05 MED ORDER — HEPARIN SODIUM (PORCINE) 1000 UNIT/ML IJ SOLN
INTRAMUSCULAR | Status: DC | PRN
  Administered 2016-09-05: 6000 [IU] via INTRAVENOUS

## 2016-09-05 MED ORDER — IOPAMIDOL (ISOVUE-370) INJECTION 76%
INTRAVENOUS | Status: AC
  Filled 2016-09-05: qty 50

## 2016-09-05 MED ORDER — SODIUM CHLORIDE 0.9 % IV SOLN
250.0000 mL | INTRAVENOUS | Status: DC | PRN
Start: 2016-09-05 — End: 2016-09-05

## 2016-09-05 MED ORDER — IOPAMIDOL (ISOVUE-370) INJECTION 76%
INTRAVENOUS | Status: DC | PRN
Start: 2016-09-05 — End: 2016-09-05
  Administered 2016-09-05: 105 mL via INTRA_ARTERIAL

## 2016-09-05 MED ORDER — SODIUM CHLORIDE 0.9% FLUSH: 3.0000 mL | Freq: Two times a day (BID) | INTRAVENOUS | Status: DC

## 2016-09-05 MED ORDER — SODIUM CHLORIDE 0.9 % IV SOLN: INTRAVENOUS | Status: AC

## 2016-09-05 MED ORDER — SODIUM CHLORIDE 0.9% FLUSH: 3.0000 mL | INTRAVENOUS | Status: DC | PRN

## 2016-09-05 MED ORDER — HEPARIN (PORCINE) IN NACL 2-0.9 UNIT/ML-% IJ SOLN
INTRAMUSCULAR | Status: AC
  Filled 2016-09-05: qty 1000

## 2016-09-05 MED ORDER — ASPIRIN 81 MG PO CHEW
324.0000 mg | CHEWABLE_TABLET | ORAL | Status: AC
  Administered 2016-09-05: 81 mg via ORAL

## 2016-09-05 MED ORDER — SODIUM CHLORIDE 0.9 % WEIGHT BASED INFUSION
3.0000 mL/kg/h | INTRAVENOUS | Status: AC
  Administered 2016-09-05: 3 mL/kg/h via INTRAVENOUS

## 2016-09-05 MED ORDER — VERAPAMIL HCL 2.5 MG/ML IV SOLN
INTRAVENOUS | Status: DC | PRN
  Administered 2016-09-05: 10 mL via INTRA_ARTERIAL

## 2016-09-05 MED ORDER — LIDOCAINE HCL (PF) 1 % IJ SOLN
INTRAMUSCULAR | Status: AC
Start: 2016-09-05 — End: 2016-09-05
  Filled 2016-09-05: qty 30

## 2016-09-05 MED ORDER — FENTANYL CITRATE (PF) 100 MCG/2ML IJ SOLN
INTRAMUSCULAR | Status: AC
  Filled 2016-09-05: qty 2

## 2016-09-05 MED ORDER — HEPARIN SODIUM (PORCINE) 1000 UNIT/ML IJ SOLN
INTRAMUSCULAR | Status: AC
  Filled 2016-09-05: qty 1

## 2016-09-05 MED ORDER — ASPIRIN 81 MG PO CHEW
CHEWABLE_TABLET | ORAL | Status: AC
  Administered 2016-09-05: 81 mg via ORAL
  Filled 2016-09-05: qty 1

## 2016-09-05 MED ORDER — MIDAZOLAM HCL 2 MG/2ML IJ SOLN
INTRAMUSCULAR | Status: DC | PRN
  Administered 2016-09-05 (×2): 1 mg via INTRAVENOUS

## 2016-09-05 MED ORDER — SODIUM CHLORIDE 0.9 % IV SOLN: 250.0000 mL | INTRAVENOUS | Status: DC | PRN

## 2016-09-05 MED ORDER — VERAPAMIL HCL 2.5 MG/ML IV SOLN
INTRAVENOUS | Status: AC
  Filled 2016-09-05: qty 2

## 2016-09-05 MED ORDER — FENTANYL CITRATE (PF) 100 MCG/2ML IJ SOLN
INTRAMUSCULAR | Status: DC | PRN
  Administered 2016-09-05 (×2): 25 ug via INTRAVENOUS

## 2016-09-05 MED ORDER — LIDOCAINE HCL (PF) 1 % IJ SOLN
INTRAMUSCULAR | Status: DC | PRN
Start: 2016-09-05 — End: 2016-09-05
  Administered 2016-09-05: 3 mL

## 2016-09-05 MED ORDER — MIDAZOLAM HCL 2 MG/2ML IJ SOLN
INTRAMUSCULAR | Status: AC
  Filled 2016-09-05: qty 2

## 2016-09-05 SURGICAL SUPPLY — 12 items
CATH EXPO 5FR ANG PIGTAIL 145 (CATHETERS) ×2 IMPLANT
CATH EXPO 5FR FR4 (CATHETERS) ×2 IMPLANT
CATH INFINITI 5 FR JL3.5 (CATHETERS) ×2 IMPLANT
DEVICE RAD COMP TR BAND LRG (VASCULAR PRODUCTS) ×2 IMPLANT
GLIDESHEATH SLEND SS 6F .021 (SHEATH) ×2 IMPLANT
GUIDEWIRE INQWIRE 1.5J.035X260 (WIRE) ×1 IMPLANT
INQWIRE 1.5J .035X260CM (WIRE) ×2
KIT HEART LEFT (KITS) ×2 IMPLANT
PACK CARDIAC CATHETERIZATION (CUSTOM PROCEDURE TRAY) ×2 IMPLANT
SYR MEDRAD MARK V 150ML (SYRINGE) ×2 IMPLANT
TRANSDUCER W/STOPCOCK (MISCELLANEOUS) ×2 IMPLANT
TUBING CIL FLEX 10 FLL-RA (TUBING) ×2 IMPLANT

## 2016-09-05 NOTE — Discharge Instructions (Signed)
Radial Site Care °Refer to this sheet in the next few weeks. These instructions provide you with information about caring for yourself after your procedure. Your health care provider may also give you more specific instructions. Your treatment has been planned according to current medical practices, but problems sometimes occur. Call your health care provider if you have any problems or questions after your procedure. °What can I expect after the procedure? °After your procedure, it is typical to have the following: °· Bruising at the radial site that usually fades within 1-2 weeks. °· Blood collecting in the tissue (hematoma) that may be painful to the touch. It should usually decrease in size and tenderness within 1-2 weeks. °Follow these instructions at home: °· Take medicines only as directed by your health care provider. °· You may shower 24-48 hours after the procedure or as directed by your health care provider. Remove the bandage (dressing) and gently wash the site with plain soap and water. Pat the area dry with a clean towel. Do not rub the site, because this may cause bleeding. °· Do not take baths, swim, or use a hot tub until your health care provider approves. °· Check your insertion site every day for redness, swelling, or drainage. °· Do not apply powder or lotion to the site. °· Do not flex or bend the affected arm for 24 hours or as directed by your health care provider. °· Do not push or pull heavy objects with the affected arm for 24 hours or as directed by your health care provider. °· Do not lift over 10 lb (4.5 kg) for 5 days after your procedure or as directed by your health care provider. °· Ask your health care provider when it is okay to: °¨ Return to work or school. °¨ Resume usual physical activities or sports. °¨ Resume sexual activity. °· Do not drive home if you are discharged the same day as the procedure. Have someone else drive you. °· You may drive 24 hours after the procedure  unless otherwise instructed by your health care provider. °· Do not operate machinery or power tools for 24 hours after the procedure. °· If your procedure was done as an outpatient procedure, which means that you went home the same day as your procedure, a responsible adult should be with you for the first 24 hours after you arrive home. °· Keep all follow-up visits as directed by your health care provider. This is important. °Contact a health care provider if: °· You have a fever. °· You have chills. °· You have increased bleeding from the radial site. Hold pressure on the site. CALL 911 °Get help right away if: °· You have unusual pain at the radial site. °· You have redness, warmth, or swelling at the radial site. °· You have drainage (other than a small amount of blood on the dressing) from the radial site. °· The radial site is bleeding, and the bleeding does not stop after 30 minutes of holding steady pressure on the site. °· Your arm or hand becomes pale, cool, tingly, or numb. °This information is not intended to replace advice given to you by your health care provider. Make sure you discuss any questions you have with your health care provider. °Document Released: 07/21/2010 Document Revised: 11/24/2015 Document Reviewed: 01/04/2014 °Elsevier Interactive Patient Education © 2017 [employer] ° ° °

## 2016-09-05 NOTE — Interval H&P Note (Signed)
History and Physical Interval Note:  09/05/2016 9:31 AM  Belinda FisherMichael K Brunet  has presented today for cardiac cath with the diagnosis of unstable angina. The various methods of treatment have been discussed with the patient and family. After consideration of risks, benefits and other options for treatment, the patient has consented to  Procedure(s): Left Heart Cath and Coronary Angiography (N/A) as a surgical intervention .  The patient's history has been reviewed, patient examined, no change in status, stable for surgery.  I have reviewed the patient's chart and labs.  Questions were answered to the patient's satisfaction.    Cath Lab Visit (complete for each Cath Lab visit)  Clinical Evaluation Leading to the Procedure:   ACS: No.  Non-ACS:    Anginal Classification: CCS III  Anti-ischemic medical therapy: Maximal Therapy (2 or more classes of medications)  Non-Invasive Test Results: No non-invasive testing performed  Prior CABG: No previous CABG         Verne Carrowhristopher McAlhany

## 2016-09-06 ENCOUNTER — Telehealth: Payer: Self-pay | Admitting: Cardiovascular Disease

## 2016-09-06 MED FILL — Heparin Sodium (Porcine) 2 Unit/ML in Sodium Chloride 0.9%: INTRAMUSCULAR | Qty: 500 | Status: AC

## 2016-09-06 NOTE — Telephone Encounter (Signed)
Follow up    Pt wants an appt earlier than May for check up , going to be out of town march 13-15, does not want to see PA or APP

## 2016-09-06 NOTE — Telephone Encounter (Signed)
New message      Pt had a cath yesterday and need a cath follow up.  I tried to schedule an appt with the APP but pt want Dr Royann Shiversroitoru only.  Can you work pt in somewhere on his schedule?

## 2016-09-06 NOTE — Telephone Encounter (Signed)
Please advise 

## 2016-09-10 DIAGNOSIS — S8012XA Contusion of left lower leg, initial encounter: Secondary | ICD-10-CM | POA: Diagnosis not present

## 2016-09-10 DIAGNOSIS — S8002XA Contusion of left knee, initial encounter: Secondary | ICD-10-CM | POA: Diagnosis not present

## 2016-09-10 DIAGNOSIS — M1712 Unilateral primary osteoarthritis, left knee: Secondary | ICD-10-CM | POA: Diagnosis not present

## 2016-09-11 NOTE — Telephone Encounter (Signed)
Appt scheduled for 3/20 with Dr C at 11:45a. Called patient to notify. Pt verbalized understanding.

## 2016-09-18 ENCOUNTER — Ambulatory Visit (INDEPENDENT_AMBULATORY_CARE_PROVIDER_SITE_OTHER): Payer: 59 | Admitting: Cardiovascular Disease

## 2016-09-18 ENCOUNTER — Encounter: Payer: Self-pay | Admitting: Cardiovascular Disease

## 2016-09-18 VITALS — BP 116/71 | HR 64 | Ht 73.0 in | Wt 303.0 lb

## 2016-09-18 DIAGNOSIS — I25118 Atherosclerotic heart disease of native coronary artery with other forms of angina pectoris: Secondary | ICD-10-CM

## 2016-09-18 DIAGNOSIS — I10 Essential (primary) hypertension: Secondary | ICD-10-CM | POA: Diagnosis not present

## 2016-09-18 DIAGNOSIS — R011 Cardiac murmur, unspecified: Secondary | ICD-10-CM

## 2016-09-18 DIAGNOSIS — E7800 Pure hypercholesterolemia, unspecified: Secondary | ICD-10-CM | POA: Diagnosis not present

## 2016-09-18 DIAGNOSIS — G4733 Obstructive sleep apnea (adult) (pediatric): Secondary | ICD-10-CM

## 2016-09-18 MED ORDER — ISOSORBIDE MONONITRATE ER 30 MG PO TB24: 30.0000 mg | ORAL_TABLET | Freq: Every day | ORAL | 3 refills | Status: DC

## 2016-09-18 NOTE — Progress Notes (Signed)
Patient ID: Craig Copeland, male   DOB: August 28, 1955, 60 y.o.   MRN: [id number]     Cardiology Office Note   Date:  09/22/2016   ID:  Craig Copeland, DOB 1956-06-24, MRN [id number]  PCP:  [name], MD  Cardiologist:   Thurmon Fair, MD   No chief complaint on file.     History of Present Illness: Craig Copeland is a 61 y.o. male who presents for follow-up for coronary artery disease, hypertension and hyperlipidemia.  Craig Copeland had recurrence of chest pain over the last several months and underwent cardiac catheterization on March 7. The previously placed stents in his right coronary artery were widely patent and the only significant stenosis wasn't ostial 70% lesion in a relatively small caliber ramus intermedius artery. No revascularization was performed.  Unfortunately he continues to experience chest discomfort 2 or 3 times a week. This is generally relatively mild and occurs usually at rest. He avoids taking nitroglycerin since this causes severe headaches. He takes extra aspirin in the symptoms usually resolve in less than 30 minutes. He is already taking the maximum dose of amlodipine and ranolazine. Beta blockers are unlikely to help since symptoms occur at rest and may cause side effects since he is relatively bradycardic  He seems to be tolerating pravastatin without the previous side effects that he had with atorvastatin and rosuvastatin.           He has a history of previous stent to the right coronary artery(2004, drug-eluting 3.0 x 33 mm Cypher stent overlapping 3.0 x 23 mm Cypher stent in the mid to proximal right coronary artery) and a moderate to severe ostial lesion of a small to medium ramus intermedius artery (too small for percutaneous revascularization). No change on cardiac cath performed in 2018. 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  His last cardiac catheterization was  in March 2018, that showed findings identical to 2014, 2011 and 2004. He has normal left ventricle systolic function by echo most recently performed in June of 2014.  History of hypertension and hyperlipidemia. He is morbidly obese. He is compliant with CPAP for obstructive sleep apnea. He is on chronic treatment with aspirin and clopidogrel.    Past Medical History:  Diagnosis Date  . CAD (coronary artery disease)   . Dizziness   . Dyslipidemia   . Flu 06/2016   Had again 01/208 with PNA  . Obesity   . OSA (obstructive sleep apnea)   . Systemic hypertension   . Vasospastic angina [facility])     Past Surgical History:  Procedure Laterality Date  . CARDIAC CATHETERIZATION  12/02/2002   <20% restenosis RCA  . CARDIAC CATHETERIZATION  01/30/2010   20-30% in-stent restenosis,60-70% ostial stenosis  . CORONARY ANGIOPLASTY WITH STENT PLACEMENT  11/13/2002   mid RCA  . KNEE SURGERY  2003  . LEFT HEART CATH AND CORONARY ANGIOGRAPHY N/A 09/05/2016   Procedure: Left Heart Cath and Coronary Angiography;  Surgeon: Kathleene Hazel, MD;  Location: [facility] INVASIVE CV LAB;  Service: Cardiovascular;  Laterality: N/A;  . LEFT HEART CATHETERIZATION WITH CORONARY ANGIOGRAM N/A 12/22/2012   Procedure: LEFT HEART CATHETERIZATION WITH CORONARY ANGIOGRAM;  Surgeon: Thurmon Fair, MD;  Location: [facility] CATH LAB;  Service: Cardiovascular;  Laterality: N/A;  . NOSE SURGERY    . VASECTOMY       Current Outpatient Prescriptions  Medication Sig Dispense Refill  . acetaminophen (TYLENOL) 325 MG tablet Take  650 mg by mouth daily as needed for moderate pain or headache.    [name]. amLODipine (NORVASC) 10 MG tablet Take 10 mg by mouth daily.    [name]. aspirin 81 MG tablet Take 81 mg by mouth every evening.     . cholecalciferol (VITAMIN D) 1000 units tablet Take 1,000 Units by mouth daily.    . clopidogrel (PLAVIX) 75 MG tablet Take 75 mg by mouth daily.    [name]. EZETIMIBE PO Take 10 mg by mouth daily.    . fish oil-omega-3 fatty  acids 1000 MG capsule Take 1 g by mouth daily.     . isosorbide mononitrate (IMDUR) 30 MG 24 hr tablet Take 1 tablet (30 mg total) by mouth daily. 90 tablet 3  . losartan (COZAAR) 100 MG tablet Take 0.5 tablets (50 mg total) by mouth daily. (Patient taking differently: Take 100 mg by mouth daily. ) 90 tablet 3  . nitroGLYCERIN (NITROSTAT) 0.4 MG SL tablet Place 1 tablet (0.4 mg total) under the tongue every 5 (five) minutes as needed for chest pain. 90 tablet 1  . pravastatin (PRAVACHOL) 80 MG tablet Take 80 mg by mouth every evening.     . ranolazine (RANEXA) 1000 MG SR tablet Take 1 tablet (1,000 mg total) by mouth 2 (two) times daily. 180 tablet 1   No current facility-administered medications for this visit.     Allergies:   Patient has no known allergies.    Social History:  The patient  reports that he has never smoked. He has never used smokeless tobacco. He reports that he does not drink alcohol or use drugs.   Family History:  The patient's family history includes Alzheimer's disease in his paternal grandfather; Cancer in his paternal grandfather; Heart failure in his father and maternal grandmother; Hypertension in his father; Stroke in his paternal grandmother.    ROS:  Please see the history of present illness.   He has mild ankle swelling that always resolves by the next morning. He denies palpitations or syncope or dizziness. He does not have exertional dyspnea. He has not taken Viagra in quite a while. Otherwise, review of systems positive for none.   All other systems are reviewed and negative.    PHYSICAL EXAM: VS:  BP 116/71   Pulse 64   Ht 6\' 1"  (1.854 m)   Wt (!) 137.4 kg (303 lb)   SpO2 98%   BMI 39.98 kg/m  , BMI Body mass index is 39.98 kg/m.  General: Alert, oriented x3, no distress Head: no evidence of trauma, PERRL, EOMI, no exophtalmos or lid lag, no myxedema, no xanthelasma; normal ears, nose and oropharynx Neck: normal jugular venous pulsations and no  hepatojugular reflux; brisk carotid pulses without delay and no carotid bruits Chest: clear to auscultation, no signs of consolidation by percussion or palpation, normal fremitus, symmetrical and full respiratory excursions Cardiovascular: normal position and quality of the apical impulse, regular rhythm, normal first and second heart sounds, grade 2/6 early peaking systolic ejection murmur heard at both the right and left upper sternal borders, no diastolic murmurs, rubs or gallops Abdomen: no tenderness or distention, no masses by palpation, no abnormal pulsatility or arterial bruits, normal bowel sounds, no hepatosplenomegaly Extremities: no clubbing, cyanosis or edema; 2+ radial, ulnar and brachial pulses bilaterally; 2+ right femoral, posterior tibial and dorsalis pedis pulses; 2+ left femoral, posterior tibial and dorsalis pedis pulses; no subclavian or femoral bruits Neurological: grossly nonfocal Psych: euthymic mood, full affect  EKG:  EKG is not ordered today.  Recent Labs: labs performed in June 2015 at [facility] medical, total cholesterol 142, triglycerides 91, HDL 35, LDL 89, hemoglobin 15.9, creatinine 1.2, normal electrolytes and liver function tests  Wt Readings from Last 3 Encounters:  09/18/16 (!) 137.4 kg (303 lb)  09/05/16 131.5 kg (290 lb)  08/28/16 (!) 137.3 kg (302 lb 12.8 oz)     ASSESSMENT AND PLAN:  1. CAD s/p PCI RCA. He May indeed have vasospastic angina, since there is no evidence of progression of his coronary stenoses.. Slow heart rate precludes use of beta blockers and would probably worsen his tendency to vasospasm. He is on maximum doses of amlodipine and ranolazine. He has preferred to avoid long-acting nitrates due to headaches, but I encouraged him to try again since he may develop tolerance of the side effect after a week or 2.   2. Hyperlipidemia. His LDL cholesterol was not quite at goal even when taking Crestor and I am skeptical that the current  dose of pravastatin will be sufficient to bring his LDL to the target of 70 mg/deciliter or less. We'll repeat a lipid profile  3. Hypertension well controlled  4. Severe obesity: Continue weight loss efforts.  5. OSA compliant with CPAP   6. 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  Current medicines are reviewed at length with the patient today.  The patient does not have concerns regarding medicines.  The following changes have been made:  no change  Labs/ tests ordered today include:  No orders of the defined types were placed in this encounter.   Patient Instructions  Dr Craig Copeland has recommended making the following medication changes: 1. START Isosorbide 30 mg  >>Okay to start with a half tablet daily then increase to a whole tablet as tolerated  Your physician recommends that you schedule a follow-up appointment in 3-4 months.  If you need a refill on your cardiac medications before your next appointment, please call your pharmacy.     Signed, Thurmon Fair, MD  09/22/2016 12:26 PM    Thurmon Fair, MD, [facility] [phone] office [phone] pager

## 2016-09-18 NOTE — Patient Instructions (Signed)
Dr Royann Shiversroitoru has recommended making the following medication changes: 1. START Isosorbide 30 mg  >>Okay to start with a half tablet daily then increase to a whole tablet as tolerated  Your physician recommends that you schedule a follow-up appointment in 3-4 months.  If you need a refill on your cardiac medications before your next appointment, please call your pharmacy.

## 2016-10-02 DIAGNOSIS — I208 Other forms of angina pectoris: Secondary | ICD-10-CM | POA: Diagnosis not present

## 2016-10-04 DIAGNOSIS — R739 Hyperglycemia, unspecified: Secondary | ICD-10-CM | POA: Diagnosis not present

## 2016-10-04 DIAGNOSIS — E782 Mixed hyperlipidemia: Secondary | ICD-10-CM | POA: Diagnosis not present

## 2016-10-15 ENCOUNTER — Telehealth: Payer: Self-pay | Admitting: Cardiovascular Disease

## 2016-10-15 ENCOUNTER — Encounter: Payer: Self-pay | Admitting: Cardiovascular Disease

## 2016-10-15 ENCOUNTER — Telehealth: Payer: Self-pay | Admitting: *Deleted

## 2016-10-15 DIAGNOSIS — M1712 Unilateral primary osteoarthritis, left knee: Secondary | ICD-10-CM | POA: Diagnosis not present

## 2016-10-15 NOTE — Telephone Encounter (Signed)
lmtcb  Form does not clearly state the surgeon and procedure.  Called [facility] - left message with Halford Decamp requesting the above information

## 2016-10-15 NOTE — Telephone Encounter (Signed)
PATIENT STOP OFFICE WITH CARDIAC CLEARANCE FORM TO BE FILLED OUT FOR TOTAL KNEE TO BE FILLED OUT  PLACED FORM IN DR CROITORU'S MAIL AREA. PLEASE LET PATIENT KNOW WHEN FAXED

## 2016-10-15 NOTE — Telephone Encounter (Signed)
Please tell me who to make it out to and I can take care of it [facility]

## 2016-10-15 NOTE — Telephone Encounter (Signed)
Request for surgical clearance:  1. What type of surgery is being performed?TOTAL KNEE  2. When is this surgery scheduled? TBA  3. Are there any medications that need to be held prior to surgery and how long?ASPIRIN AND PLAVIX  4. Name of physician performing surgery? DR Earvin Hansen  5. What is your office phone and fax number? [facility] FAX [phone] PHONE [phone]

## 2016-10-15 NOTE — Telephone Encounter (Signed)
Left message for pt to call.

## 2016-10-15 NOTE — Telephone Encounter (Signed)
New message     Please call pt is returning Chelle call

## 2016-10-15 NOTE — Telephone Encounter (Signed)
epicd 

## 2016-10-16 NOTE — Telephone Encounter (Signed)
Spoke with pt, aware he has been cleared for surgery. Not sure why chelley may have called.

## 2016-10-16 NOTE — Telephone Encounter (Signed)
Follow up ° °Pt returning nurses call. ° °Please f/u °

## 2016-10-23 DIAGNOSIS — M1712 Unilateral primary osteoarthritis, left knee: Secondary | ICD-10-CM | POA: Diagnosis not present

## 2016-12-03 ENCOUNTER — Encounter: Payer: Self-pay | Admitting: Cardiovascular Disease

## 2016-12-03 ENCOUNTER — Ambulatory Visit (INDEPENDENT_AMBULATORY_CARE_PROVIDER_SITE_OTHER): Payer: 59 | Admitting: Cardiovascular Disease

## 2016-12-03 VITALS — BP 120/76 | HR 58 | Ht 73.0 in | Wt 298.0 lb

## 2016-12-03 DIAGNOSIS — E7800 Pure hypercholesterolemia, unspecified: Secondary | ICD-10-CM | POA: Diagnosis not present

## 2016-12-03 DIAGNOSIS — I10 Essential (primary) hypertension: Secondary | ICD-10-CM | POA: Diagnosis not present

## 2016-12-03 DIAGNOSIS — I25118 Atherosclerotic heart disease of native coronary artery with other forms of angina pectoris: Secondary | ICD-10-CM | POA: Diagnosis not present

## 2016-12-03 DIAGNOSIS — G4733 Obstructive sleep apnea (adult) (pediatric): Secondary | ICD-10-CM | POA: Diagnosis not present

## 2016-12-03 NOTE — Patient Instructions (Signed)
Dr Croitoru recommends that you schedule a follow-up appointment in 12 months. You will receive a reminder letter in the mail two months in advance. If you don't receive a letter, please call our office to schedule the follow-up appointment.  If you need a refill on your cardiac medications before your next appointment, please call your pharmacy. 

## 2016-12-03 NOTE — Progress Notes (Signed)
Patient ID: Craig Copeland, male   DOB: 1955-11-01, 61 y.o.   MRN: [id number]     Cardiology Office Note   Date:  12/05/2016   ID:  Craig Copeland, DOB 09-18-1955, MRN [id number]  PCP:  [name], MD  Cardiologist:   Thurmon Fair, MD   Chief Complaint  Patient presents with  . Follow-up    3 months  . Headache  . Edema    Ankles.      History of Present Illness: Craig Copeland is a 61 y.o. male who presents for follow-up for coronary artery disease, hypertension and hyperlipidemia.  Due to suspicion for coronary vasospasm as a cause of his episodes of angina we started long-acting nitrates. After initial period of headaches, he has developed tolerance to the side effect and does not seem to be troubled by. The isosorbide has had a highly beneficial effect, virtually abolishing his episodes of chest pain. He is already on maximum dose amlodipine as well as Ranexa.  He last underwent cardiac catheterization on September 05, 2016. The previously placed stents in his right coronary artery were widely patent and the only significant stenosis wasn't ostial 70% lesion in a relatively small caliber ramus intermedius artery. No revascularization was performed.  He seems to be tolerating pravastatin without problems. He was not tolerant of either rosuvastatin or atorvastatin.  The patient specifically denies any chest pain at rest or exertion, dyspnea at rest or with exertion, orthopnea, paroxysmal nocturnal dyspnea, syncope, palpitations, focal neurological deficits, intermittent claudication, lower extremity edema, unexplained weight gain, cough, hemoptysis or wheezing.           He has a history of previous stent to the right coronary artery(2004, drug-eluting 3.0 x 33 mm Cypher stent overlapping 3.0 x 23 mm Cypher stent in the mid to proximal right coronary artery) and a moderate to severe ostial lesion of a small to medium ramus intermedius artery (too small for percutaneous  revascularization). No change on cardiac cath performed in 2018. 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  His last cardiac catheterization was in March 2018, that showed findings identical to 2014, 2011 and 2004. He has normal left ventricle systolic function by echo most recently performed in June of 2014.  History of hypertension and hyperlipidemia. He is morbidly obese. He is compliant with CPAP for obstructive sleep apnea. He is on chronic treatment with aspirin and clopidogrel.    Past Medical History:  Diagnosis Date  . CAD (coronary artery disease)   . Dizziness   . Dyslipidemia   . Flu 06/2016   Had again 01/208 with PNA  . Obesity   . OSA (obstructive sleep apnea)   . Systemic hypertension   . Vasospastic angina [facility])     Past Surgical History:  Procedure Laterality Date  . CARDIAC CATHETERIZATION  12/02/2002   <20% restenosis RCA  . CARDIAC CATHETERIZATION  01/30/2010   20-30% in-stent restenosis,60-70% ostial stenosis  . CORONARY ANGIOPLASTY WITH STENT PLACEMENT  11/13/2002   mid RCA  . KNEE SURGERY  2003  . LEFT HEART CATH AND CORONARY ANGIOGRAPHY N/A 09/05/2016   Procedure: Left Heart Cath and Coronary Angiography;  Surgeon: Kathleene Hazel, MD;  Location: [facility] INVASIVE CV LAB;  Service: Cardiovascular;  Laterality: N/A;  . LEFT HEART CATHETERIZATION WITH CORONARY ANGIOGRAM N/A 12/22/2012   Procedure: LEFT HEART CATHETERIZATION WITH CORONARY ANGIOGRAM;  Surgeon: Thurmon Fair, MD;  Location: [facility] CATH LAB;  Service: Cardiovascular;  Laterality: N/A;  . NOSE SURGERY    . VASECTOMY       Current Outpatient Prescriptions  Medication Sig Dispense Refill  . acetaminophen (TYLENOL) 325 MG tablet Take 650 mg by mouth daily as needed for moderate pain or headache.    [name]. amLODipine (NORVASC) 10 MG tablet Take 10 mg by mouth daily.    [name]. aspirin 81 MG tablet Take 81 mg by mouth every evening.     .  cholecalciferol (VITAMIN D) 1000 units tablet Take 1,000 Units by mouth daily.    . clopidogrel (PLAVIX) 75 MG tablet Take 75 mg by mouth daily.    [name]. EZETIMIBE PO Take 10 mg by mouth daily.    . fish oil-omega-3 fatty acids 1000 MG capsule Take 1 g by mouth daily.     . isosorbide mononitrate (IMDUR) 30 MG 24 hr tablet Take 1 tablet (30 mg total) by mouth daily. 90 tablet 3  . losartan (COZAAR) 100 MG tablet Take 0.5 tablets (50 mg total) by mouth daily. (Patient taking differently: Take 100 mg by mouth daily. ) 90 tablet 3  . nitroGLYCERIN (NITROSTAT) 0.4 MG SL tablet Place 1 tablet (0.4 mg total) under the tongue every 5 (five) minutes as needed for chest pain. 90 tablet 1  . pravastatin (PRAVACHOL) 80 MG tablet Take 80 mg by mouth every evening.     . ranolazine (RANEXA) 1000 MG SR tablet Take 1 tablet (1,000 mg total) by mouth 2 (two) times daily. 180 tablet 1   No current facility-administered medications for this visit.     Allergies:   Patient has no known allergies.    Social History:  The patient  reports that he has never smoked. He has never used smokeless tobacco. He reports that he does not drink alcohol or use drugs.   Family History:  The patient's family history includes Alzheimer's disease in his paternal grandfather; Cancer in his paternal grandfather; Heart failure in his father and maternal grandmother; Hypertension in his father; Stroke in his paternal grandmother.    ROS:  Please see the history of present illness.   He has mild ankle swelling that always resolves by the next morning. He denies palpitations or syncope or dizziness. He does not have exertional dyspnea. He has not taken Viagra in quite a while. Otherwise, review of systems positive for none.   All other systems are reviewed and negative.    PHYSICAL EXAM: VS:  BP 120/76   Pulse (!) 58   Ht 6\' 1"  (1.854 m)   Wt 298 lb (135.2 kg)   BMI 39.32 kg/m  , BMI Body mass index is 39.32 kg/m.  General:  Alert, oriented x3, no distress Head: no evidence of trauma, PERRL, EOMI, no exophtalmos or lid lag, no myxedema, no xanthelasma; normal ears, nose and oropharynx Neck: normal jugular venous pulsations and no hepatojugular reflux; brisk carotid pulses without delay and no carotid bruits Chest: clear to auscultation, no signs of consolidation by percussion or palpation, normal fremitus, symmetrical and full respiratory excursions Cardiovascular: normal position and quality of the apical impulse, regular rhythm, normal first and second heart sounds, grade 2/6 early peaking systolic ejection murmur heard at both the right and left upper sternal borders, no diastolic murmurs, rubs or gallops Abdomen: no tenderness or distention, no masses by palpation, no abnormal pulsatility or arterial bruits, normal bowel sounds, no hepatosplenomegaly Extremities: no clubbing, cyanosis or edema; 2+ radial, ulnar and brachial pulses bilaterally; 2+  right femoral, posterior tibial and dorsalis pedis pulses; 2+ left femoral, posterior tibial and dorsalis pedis pulses; no subclavian or femoral bruits Neurological: grossly nonfocal Psych: euthymic mood, full affect   EKG:  EKG is not ordered today.  Recent Labs: labs performed in June 2015 at [facility], total cholesterol 142, triglycerides 91, HDL 35, LDL 89, hemoglobin 15.9, creatinine 1.2, normal electrolytes and liver function tests  Lipid Panel     Component Value Date/Time   CHOL 137 04/08/2015 0804   TRIG 157 (H) 04/08/2015 0804   HDL 32 (L) 04/08/2015 0804   CHOLHDL 4.3 04/08/2015 0804   VLDL 31 (H) 04/08/2015 0804   LDLCALC 74 04/08/2015 0804     Wt Readings from Last 3 Encounters:  12/03/16 298 lb (135.2 kg)  09/18/16 (!) 303 lb (137.4 kg)  09/05/16 290 lb (131.5 kg)     ASSESSMENT AND PLAN:  1. Coronary artery disease involving native coronary artery of native heart with other form of angina pectoris (HCC)   2. Pure  hypercholesterolemia   3. Essential hypertension   4. Morbid obesity (HCC)   5. Obstructive sleep apnea      1. CAD s/p PCI RCA. In addition to fixed CAD he is suspected of having vasospastic angina, and had a good response to long-acting nitrates on top of amlodipine Slow heart rate precludes use of beta blockers and would probably worsen his tendency to vasospasm.  2. Hyperlipidemia. His LDL cholesterol was not quite at goal even when taking Crestor and I am skeptical that the current dose of pravastatin will be sufficient to bring his LDL to the target of 70 mg/deciliter or less. He did not have the repeat lipid panel as planned. 3. Hypertension well controlled 4. Severe obesity: No longer and morbid obesity range, but certainly some lose a lot of weight 5. OSA reports that he is 100% compliant with CPAP   Current medicines are reviewed at length with the patient today.  The patient does not have concerns regarding medicines.  The following changes have been made:  no change  Labs/ tests ordered today include:  No orders of the defined types were placed in this encounter.   Patient Instructions  Dr Royann Shivers recommends that you schedule a follow-up appointment in 12 months. You will receive a reminder letter in the mail two months in advance. If you don't receive a letter, please call our office to schedule the follow-up appointment.  If you need a refill on your cardiac medications before your next appointment, please call your pharmacy.     Signed, Thurmon Fair, MD  12/05/2016 4:22 PM    Thurmon Fair, MD, [facility] [phone] office [phone] pager

## 2016-12-09 ENCOUNTER — Other Ambulatory Visit: Payer: Self-pay | Admitting: Cardiovascular Disease

## 2017-01-11 ENCOUNTER — Ambulatory Visit: Payer: Self-pay | Admitting: Orthopedic Surgery

## 2017-02-14 DIAGNOSIS — Z1159 Encounter for screening for other viral diseases: Secondary | ICD-10-CM | POA: Diagnosis not present

## 2017-02-14 DIAGNOSIS — Z0000 Encounter for general adult medical examination without abnormal findings: Secondary | ICD-10-CM | POA: Diagnosis not present

## 2017-02-14 DIAGNOSIS — Z125 Encounter for screening for malignant neoplasm of prostate: Secondary | ICD-10-CM | POA: Diagnosis not present

## 2017-02-19 DIAGNOSIS — E1165 Type 2 diabetes mellitus with hyperglycemia: Secondary | ICD-10-CM | POA: Diagnosis not present

## 2017-02-27 NOTE — H&P (Signed)
TOTAL KNEE ADMISSION H&P  Patient is being admitted for left total knee arthroplasty.  Subjective:  Chief Complaint:left knee pain.  HPI: Craig Copeland, 61 y.o. male, has a history of pain and functional disability in the left knee due to arthritis and has failed non-surgical conservative treatments for greater than 12 weeks to includeNSAID's and/or analgesics, corticosteriod injections, viscosupplementation injections, use of assistive devices, weight reduction as appropriate and activity modification.  Onset of symptoms was gradual, starting 2 years ago with gradually worsening course since that time. The patient noted prior procedures on the knee to include  arthroscopy on the left knee(s).  Patient currently rates pain in the left knee(s) at 7 out of 10 with activity. Patient has worsening of pain with activity and weight bearing, pain that interferes with activities of daily living and crepitus.  Patient has evidence of subchondral cysts, subchondral sclerosis and periarticular osteophytes by imaging studies. There is no active infection.  Patient Active Problem List   Diagnosis Date Noted  . Chest pain with high risk for cardiac etiology 08/28/2016  . Murmur, cardiac 04/01/2015  . Angina pectoris syndrome (HCC) 12/18/2012  . CAD (coronary artery disease) 12/18/2012  . Hyperlipidemia 12/18/2012  . Essential hypertension 12/18/2012  . Morbid obesity (HCC) 12/18/2012  . Obstructive sleep apnea 12/18/2012   Past Medical History:  Diagnosis Date  . CAD (coronary artery disease)   . Dizziness   . Dyslipidemia   . Flu 06/2016   Had again 01/208 with PNA  . Obesity   . OSA (obstructive sleep apnea)   . Systemic hypertension   . 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 CARDIAC CATHETERIZATION  12/02/2002   <20% restenosis RCA  . CARDIAC CATHETERIZATION  01/30/2010   20-30% in-stent restenosis,60-70% ostial stenosis  . CORONARY ANGIOPLASTY  WITH STENT PLACEMENT  11/13/2002   mid RCA  . KNEE SURGERY  2003  . LEFT HEART CATH AND CORONARY ANGIOGRAPHY N/A 09/05/2016   Procedure: Left Heart Cath and Coronary Angiography;  Surgeon: Kathleene Hazelhristopher D McAlhany, MD;  [facility];  Service: Cardiovascular;  Laterality: N/A;  . LEFT HEART CATHETERIZATION WITH CORONARY ANGIOGRAM N/A 12/22/2012   Procedure: LEFT HEART CATHETERIZATION WITH CORONARY ANGIOGRAM;  Surgeon: Thurmon FairMihai Croitoru, MD;  Location: [facility] CATH LAB;  Service: Cardiovascular;  Laterality: N/A;  . NOSE SURGERY    . VASECTOMY      No prescriptions prior to admission.   No Known Allergies  Social History  Substance Use Topics  . Smoking status: Never Smoker  . Smokeless tobacco: Never Used  . Alcohol use No    Family History  Problem Relation Age of Onset  . Heart failure Father   . Hypertension Father   . Heart failure Maternal Grandmother   . Stroke Paternal Grandmother   . Cancer Paternal Grandfather   . Alzheimer's disease Paternal Grandfather      Review of Systems  Constitutional: Negative.   HENT: Negative.   Eyes: Negative.   Respiratory: Negative.   Cardiovascular: Negative.   Gastrointestinal: Negative.   Genitourinary: Negative.   Musculoskeletal: Positive for joint pain.  Skin: Negative.   Neurological: Negative.   Endo/Heme/Allergies: Negative.   Psychiatric/Behavioral: Negative.     Objective:  Physical Exam  Constitutional: He is oriented to person, place, and time. He appears well-developed.  HENT:  Head: Normocephalic.  Eyes: EOM are normal.  Neck: Normal range of motion.  Cardiovascular: Normal rate and  intact distal pulses.   Respiratory: Effort normal.  GI: Soft.  Genitourinary:  Genitourinary Comments: deferred  Musculoskeletal: He exhibits edema and tenderness.  Neurological: He is alert and oriented to person, place, and time.  Skin: Skin is warm and dry.  Psychiatric: His behavior is normal.    Vital signs in  last 24 hours: BP: ()/()  Arterial Line BP: ()/()   Labs:   Estimated body mass index is 39.32 kg/m as calculated from the following:   Height as of 12/03/16: 6\' 1"  (1.854 m).   Weight as of 12/03/16: 135.2 kg (298 lb).   Imaging Review Plain radiographs demonstrate moderate degenerative joint disease of the left knee(s). The overall alignment ismild varus. The bone quality appears to be good for age and reported activity level.  Assessment/Plan:  End stage arthritis, left knee   The patient history, physical examination, clinical judgment of the provider and imaging studies are consistent with end stage degenerative joint disease of the left knee(s) and total knee arthroplasty is deemed medically necessary. The treatment options including medical management, injection therapy arthroscopy and arthroplasty were discussed at length. The risks and benefits of total knee arthroplasty were presented and reviewed. The risks due to aseptic loosening, infection, stiffness, patella tracking problems, thromboembolic complications and other imponderables were discussed. The patient acknowledged the explanation, agreed to proceed with the plan and consent was signed. Patient is being admitted for inpatient treatment for surgery, pain control, PT, OT, prophylactic antibiotics, VTE prophylaxis, progressive ambulation and ADL's and discharge planning. The patient is planning to be discharged home with home health services

## 2017-03-06 NOTE — Progress Notes (Signed)
10-15-16 Cardiac clearance from Dr. Royann Shiversroitoru on chart  10-23-16 Surgical clearance from Dr. Renne CriglerPharr on chart  09-05-16 [facility](EPIC) CXR   08-28-16 [facility](EPIC) EKG   04-13-15 ECHO

## 2017-03-06 NOTE — Patient Instructions (Addendum)
Craig FisherMichael K Copeland  03/06/2017   Your procedure is scheduled on: 03-22-17   [facility]  Entrance [place] to 3rd floor to  Short Stay Center at 8:00 AM.   Call this number if you have problems the morning of surgery [phone]    Remember: ONLY 1 PERSON MAY GO WITH YOU TO SHORT STAY TO GET  READY MORNING OF YOUR SURGERY.  Do not eat food or drink liquids :After Midnight.     Take these medicines the morning of surgery with A SIP OF WATER: None                                You may not have any metal on your body including hair pins and              piercings  Do not wear jewelry, make-up, lotions, powders or perfumes, deodorant             Men may shave face and neck.   Do not bring valuables to the hospital. [facility] IS NOT             RESPONSIBLE   FOR VALUABLES.  Contacts, dentures or bridgework may not be worn into surgery.  Leave suitcase in the car. After surgery it may be brought to your room.   Please bring your mask and tubing for CPAP machine              Please read over the following fact sheets you were given: _____________________________________________________________________             [facility] - Preparing for [facility] Before surgery, you can play an important role.  Because skin is not sterile, your skin needs to be as free of germs as possible.  You can reduce the number of germs on your skin by washing with CHG (chlorahexidine gluconate) soap before surgery.  CHG is an antiseptic cleaner which kills germs and bonds with the skin to continue killing germs even after washing. Please DO NOT use if you have an allergy to CHG or antibacterial soaps.  If your skin becomes reddened/irritated stop using the CHG and inform your nurse when you arrive at Short Stay. Do not shave (including legs and underarms) for at least 48 hours prior to the first CHG shower.  You may shave your face/neck. Please follow these  instructions carefully:  1.  Shower with CHG Soap the night before surgery and the  morning of Surgery.  2.  If you choose to wash your hair, wash your hair first as usual with your  normal  shampoo.  3.  After you shampoo, rinse your hair and body thoroughly to remove the  shampoo.                           4.  Use CHG as you would any other liquid soap.  You can apply chg directly  to the skin and wash                       Gently with a scrungie or clean washcloth.  5.  Apply the CHG Soap to your body ONLY FROM THE NECK DOWN.   Do not use on face/ open  Wound or open sores. Avoid contact with eyes, ears mouth and genitals (private parts).                       Wash face,  Genitals (private parts) with your normal soap.             6.  Wash thoroughly, paying special attention to the area where your surgery  will be performed.  7.  Thoroughly rinse your body with warm water from the neck down.  8.  DO NOT shower/wash with your normal soap after using and rinsing off  the CHG Soap.                9.  Pat yourself dry with a clean towel.            10.  Wear clean pajamas.            11.  Place clean sheets on your bed the night of your first shower and do not  sleep with pets. Day of Surgery : Do not apply any lotions/deodorants the morning of surgery.  Please wear clean clothes to the hospital/surgery center.  FAILURE TO FOLLOW THESE INSTRUCTIONS MAY RESULT IN THE CANCELLATION OF YOUR SURGERY PATIENT SIGNATURE_________________________________  NURSE SIGNATURE__________________________________  ________________________________________________________________________   Adam Phenix  An incentive spirometer is a tool that can help keep your lungs clear and active. This tool measures how well you are filling your lungs with each breath. Taking long deep breaths may help reverse or decrease the chance of developing breathing (pulmonary) problems (especially  infection) following:  A long period of time when you are unable to move or be active. BEFORE THE PROCEDURE   If the spirometer includes an indicator to show your best effort, your nurse or respiratory therapist will set it to a desired goal.  If possible, sit up straight or lean slightly forward. Try not to slouch.  Hold the incentive spirometer in an upright position. INSTRUCTIONS FOR USE  1. Sit on the edge of your bed if possible, or sit up as far as you can in bed or on a chair. 2. Hold the incentive spirometer in an upright position. 3. Breathe out normally. 4. Place the mouthpiece in your mouth and seal your lips tightly around it. 5. Breathe in slowly and as deeply as possible, raising the piston or the ball toward the top of the column. 6. Hold your breath for 3-5 seconds or for as long as possible. Allow the piston or ball to fall to the bottom of the column. 7. Remove the mouthpiece from your mouth and breathe out normally. 8. Rest for a few seconds and repeat Steps 1 through 7 at least 10 times every 1-2 hours when you are awake. Take your time and take a few normal breaths between deep breaths. 9. The spirometer may include an indicator to show your best effort. Use the indicator as a goal to work toward during each repetition. 10. After each set of 10 deep breaths, practice coughing to be sure your lungs are clear. If you have an incision (the cut made at the time of surgery), support your incision when coughing by placing a pillow or rolled up towels firmly against it. Once you are able to get out of bed, walk around indoors and cough well. You may stop using the incentive spirometer when instructed by your caregiver.  RISKS AND COMPLICATIONS  Take your time so you do not get  dizzy or light-headed.  If you are in pain, you may need to take or ask for pain medication before doing incentive spirometry. It is harder to take a deep breath if you are having pain. AFTER  USE  Rest and breathe slowly and easily.  It can be helpful to keep track of a log of your progress. Your caregiver can provide you with a simple table to help with this. If you are using the spirometer at home, follow these instructions: [place] IF:   You are having difficultly using the spirometer.  You have trouble using the spirometer as often as instructed.  Your pain medication is not giving enough relief while using the spirometer.  You develop fever of 100.5 F (38.1 C) or higher. SEEK IMMEDIATE MEDICAL CARE IF:   You cough up bloody sputum that had not been present before.  You develop fever of 102 F (38.9 C) or greater.  You develop worsening pain at or near the incision site. MAKE SURE YOU:   Understand these instructions.  Will watch your condition.  Will get help right away if you are not doing well or get worse. Document Released: 10/29/2006 Document Revised: 09/10/2011 Document Reviewed: 12/30/2006 Exit[employer] Patient Information 2014 [employer], [place].   ________________________________________________________________________  WHAT IS A BLOOD TRANSFUSION? Blood Transfusion Information  A transfusion is the replacement of blood or some of its parts. Blood is made up of multiple cells which provide different functions.  Red blood cells carry oxygen and are used for blood loss replacement.  White blood cells fight against infection.  Platelets control bleeding.  Plasma helps clot blood.  Other blood products are available for specialized needs, such as hemophilia or other clotting disorders. BEFORE THE TRANSFUSION  Who gives blood for transfusions?   Healthy volunteers who are fully evaluated to make sure their blood is safe. This is blood bank blood. Transfusion therapy is the safest it has ever been in the practice of medicine. Before blood is taken from a donor, a complete history is taken to make sure that person has no history of diseases  nor engages in risky social behavior (examples are intravenous drug use or sexual activity with multiple partners). The donor's travel history is screened to minimize risk of transmitting infections, such as malaria. The donated blood is tested for signs of infectious diseases, such as HIV and hepatitis. The blood is then tested to be sure it is compatible with you in order to minimize the chance of a transfusion reaction. If you or a relative donates blood, this is often done in anticipation of surgery and is not appropriate for emergency situations. It takes many days to process the donated blood. RISKS AND COMPLICATIONS Although transfusion therapy is very safe and saves many lives, the main dangers of transfusion include:   Getting an infectious disease.  Developing a transfusion reaction. This is an allergic reaction to something in the blood you were given. Every precaution is taken to prevent this. The decision to have a blood transfusion has been considered carefully by your caregiver before blood is given. Blood is not given unless the benefits outweigh the risks. AFTER THE TRANSFUSION  Right after receiving a blood transfusion, you will usually feel much better and more energetic. This is especially true if your red blood cells have gotten low (anemic). The transfusion raises the level of the red blood cells which carry oxygen, and this usually causes an energy increase.  The nurse administering the transfusion will  monitor you carefully for complications. HOME CARE INSTRUCTIONS  No special instructions are needed after a transfusion. You may find your energy is better. Speak with your caregiver about any limitations on activity for underlying diseases you may have. SEEK MEDICAL CARE IF:   Your condition is not improving after your transfusion.  You develop redness or irritation at the intravenous (IV) site. SEEK IMMEDIATE MEDICAL CARE IF:  Any of the following symptoms occur over the  next 12 hours:  Shaking chills.  You have a temperature by mouth above 102 F (38.9 C), not controlled by medicine.  Chest, back, or muscle pain.  People around you feel you are not acting correctly or are confused.  Shortness of breath or difficulty breathing.  Dizziness and fainting.  You get a rash or develop hives.  You have a decrease in urine output.  Your urine turns a dark color or changes to pink, red, or brown. Any of the following symptoms occur over the next 10 days:  You have a temperature by mouth above 102 F (38.9 C), not controlled by medicine.  Shortness of breath.  Weakness after normal activity.  The white part of the eye turns yellow (jaundice).  You have a decrease in the amount of urine or are urinating less often.  Your urine turns a dark color or changes to pink, red, or brown. Document Released: 06/15/2000 Document Revised: 09/10/2011 Document Reviewed: 02/02/2008 [facility] Patient Information 2014 [place], [place].  _______________________________________________________________________

## 2017-03-08 ENCOUNTER — Encounter (HOSPITAL_COMMUNITY): Payer: Self-pay

## 2017-03-08 ENCOUNTER — Encounter (HOSPITAL_COMMUNITY)
Admission: RE | Admit: 2017-03-08 | Discharge: 2017-03-08 | Disposition: A | Discharge status: Home / Self Care | Payer: 59 | Source: Ambulatory Visit | Attending: Specialist | Admitting: Specialist

## 2017-03-08 DIAGNOSIS — M1712 Unilateral primary osteoarthritis, left knee: Secondary | ICD-10-CM | POA: Diagnosis not present

## 2017-03-08 DIAGNOSIS — Z01812 Encounter for preprocedural laboratory examination: Secondary | ICD-10-CM | POA: Insufficient documentation

## 2017-03-08 LAB — CBC
HEMATOCRIT: 39.4 % (ref 39.0–52.0)
Hemoglobin: 13.2 g/dL (ref 13.0–17.0)
MCH: 29.7 pg (ref 26.0–34.0)
MCHC: 33.5 g/dL (ref 30.0–36.0)
MCV: 88.7 fL (ref 78.0–100.0)
PLATELETS: 198 10*3/uL (ref 150–400)
RBC: 4.44 MIL/uL (ref 4.22–5.81)
RDW: 13.3 % (ref 11.5–15.5)
WBC: 4.5 10*3/uL (ref 4.0–10.5)

## 2017-03-08 LAB — BASIC METABOLIC PANEL
Anion gap: 6 (ref 5–15)
BUN: 17 mg/dL (ref 6–20)
CALCIUM: 9.2 mg/dL (ref 8.9–10.3)
CO2: 26 mmol/L (ref 22–32)
CREATININE: 0.97 mg/dL (ref 0.61–1.24)
Chloride: 108 mmol/L (ref 101–111)
GFR calc non Af Amer: >60 mL/min (ref >60)
Glucose, Bld: 105 mg/dL — ABNORMAL HIGH (ref 65–99)
Potassium: 4.1 mmol/L (ref 3.5–5.1)
SODIUM: 140 mmol/L (ref 135–145)

## 2017-03-08 LAB — URINALYSIS, ROUTINE W REFLEX MICROSCOPIC
Bilirubin Urine: NEGATIVE
Glucose, UA: NEGATIVE mg/dL
Hgb urine dipstick: NEGATIVE
Ketones, ur: NEGATIVE mg/dL
Leukocytes, UA: NEGATIVE
NITRITE: NEGATIVE
PH: 5 (ref 5.0–8.0)
Protein, ur: NEGATIVE mg/dL
SPECIFIC GRAVITY, URINE: 1.008 (ref 1.005–1.030)

## 2017-03-08 LAB — APTT: aPTT: 32 seconds (ref 24–36)

## 2017-03-08 LAB — SURGICAL PCR SCREEN
MRSA, PCR: NEGATIVE
STAPHYLOCOCCUS AUREUS: NEGATIVE

## 2017-03-08 LAB — PROTIME-INR
INR: 0.99
Prothrombin Time: 13 seconds (ref 11.4–15.2)

## 2017-03-14 ENCOUNTER — Other Ambulatory Visit: Payer: Self-pay | Admitting: Cardiovascular Disease

## 2017-03-14 MED ORDER — RANOLAZINE ER 1000 MG PO TB12: 1000.0000 mg | ORAL_TABLET | Freq: Two times a day (BID) | ORAL | 2 refills | Status: DC

## 2017-03-14 NOTE — Telephone Encounter (Signed)
Rx(s) sent to pharmacy electronically.  

## 2017-03-21 MED ORDER — DEXTROSE 5 % IV SOLN
3.0000 g | INTRAVENOUS | Status: AC
  Administered 2017-03-22: 3 g via INTRAVENOUS
  Filled 2017-03-21: qty 3

## 2017-03-22 ENCOUNTER — Encounter (HOSPITAL_COMMUNITY): Payer: Self-pay | Admitting: Emergency Medicine

## 2017-03-22 ENCOUNTER — Inpatient Hospital Stay (HOSPITAL_COMMUNITY)
Admission: RE | Admit: 2017-03-22 | Discharge: 2017-03-24 | DRG: 470 | Disposition: A | Discharge status: Home / Self Care | Payer: 59 | Source: Ambulatory Visit | Attending: Specialist | Admitting: Specialist

## 2017-03-22 ENCOUNTER — Inpatient Hospital Stay (HOSPITAL_COMMUNITY): Payer: 59 | Admitting: Anesthesiology

## 2017-03-22 ENCOUNTER — Encounter (HOSPITAL_COMMUNITY)
Admission: RE | Disposition: A | Discharge status: Home / Self Care | Payer: Self-pay | Source: Ambulatory Visit | Attending: Specialist

## 2017-03-22 DIAGNOSIS — Z9852 Vasectomy status: Secondary | ICD-10-CM

## 2017-03-22 DIAGNOSIS — Z951 Presence of aortocoronary bypass graft: Secondary | ICD-10-CM

## 2017-03-22 DIAGNOSIS — Z6839 Body mass index (BMI) 39.0-39.9, adult: Secondary | ICD-10-CM | POA: Diagnosis not present

## 2017-03-22 DIAGNOSIS — I10 Essential (primary) hypertension: Secondary | ICD-10-CM | POA: Diagnosis present

## 2017-03-22 DIAGNOSIS — G8918 Other acute postprocedural pain: Secondary | ICD-10-CM | POA: Diagnosis not present

## 2017-03-22 DIAGNOSIS — I2510 Atherosclerotic heart disease of native coronary artery without angina pectoris: Secondary | ICD-10-CM | POA: Diagnosis not present

## 2017-03-22 DIAGNOSIS — G4733 Obstructive sleep apnea (adult) (pediatric): Secondary | ICD-10-CM | POA: Diagnosis present

## 2017-03-22 DIAGNOSIS — Z955 Presence of coronary angioplasty implant and graft: Secondary | ICD-10-CM

## 2017-03-22 DIAGNOSIS — Z809 Family history of malignant neoplasm, unspecified: Secondary | ICD-10-CM

## 2017-03-22 DIAGNOSIS — R011 Cardiac murmur, unspecified: Secondary | ICD-10-CM | POA: Diagnosis present

## 2017-03-22 DIAGNOSIS — Z8249 Family history of ischemic heart disease and other diseases of the circulatory system: Secondary | ICD-10-CM | POA: Diagnosis not present

## 2017-03-22 DIAGNOSIS — Z9889 Other specified postprocedural states: Secondary | ICD-10-CM

## 2017-03-22 DIAGNOSIS — M1712 Unilateral primary osteoarthritis, left knee: Principal | ICD-10-CM | POA: Diagnosis present

## 2017-03-22 DIAGNOSIS — M1711 Unilateral primary osteoarthritis, right knee: Secondary | ICD-10-CM

## 2017-03-22 DIAGNOSIS — E785 Hyperlipidemia, unspecified: Secondary | ICD-10-CM | POA: Diagnosis not present

## 2017-03-22 DIAGNOSIS — Z820 Family history of epilepsy and other diseases of the nervous system: Secondary | ICD-10-CM

## 2017-03-22 DIAGNOSIS — Z823 Family history of stroke: Secondary | ICD-10-CM

## 2017-03-22 DIAGNOSIS — Z96659 Presence of unspecified artificial knee joint: Secondary | ICD-10-CM

## 2017-03-22 HISTORY — PX: TOTAL KNEE ARTHROPLASTY: SHX125

## 2017-03-22 LAB — TYPE AND SCREEN
ABO/RH(D): A POS
ANTIBODY SCREEN: NEGATIVE

## 2017-03-22 SURGERY — ARTHROPLASTY, KNEE, TOTAL
Anesthesia: General | Site: Knee | Laterality: Left

## 2017-03-22 MED ORDER — FENTANYL CITRATE (PF) 100 MCG/2ML IJ SOLN
INTRAMUSCULAR | Status: AC
  Filled 2017-03-22: qty 2

## 2017-03-22 MED ORDER — EPHEDRINE 5 MG/ML INJ
INTRAVENOUS | Status: AC
  Filled 2017-03-22: qty 10

## 2017-03-22 MED ORDER — SODIUM CHLORIDE 0.9 % IJ SOLN
INTRAMUSCULAR | Status: DC | PRN
  Administered 2017-03-22: 30 mL

## 2017-03-22 MED ORDER — KETOROLAC TROMETHAMINE 30 MG/ML IJ SOLN
INTRAMUSCULAR | Status: DC | PRN
  Administered 2017-03-22: 30 mg via INTRAMUSCULAR

## 2017-03-22 MED ORDER — ACETAMINOPHEN 650 MG RE SUPP: 650.0000 mg | Freq: Four times a day (QID) | RECTAL | Status: DC | PRN

## 2017-03-22 MED ORDER — HYDROMORPHONE HCL-NACL 0.5-0.9 MG/ML-% IV SOSY
1.0000 mg | PREFILLED_SYRINGE | INTRAVENOUS | Status: DC | PRN
  Administered 2017-03-22 – 2017-03-23 (×2): 1 mg via INTRAVENOUS
  Filled 2017-03-22 (×2): qty 2

## 2017-03-22 MED ORDER — DEXAMETHASONE SODIUM PHOSPHATE 10 MG/ML IJ SOLN
INTRAMUSCULAR | Status: AC
  Filled 2017-03-22: qty 1

## 2017-03-22 MED ORDER — METOCLOPRAMIDE HCL 5 MG/ML IJ SOLN: 5.0000 mg | Freq: Three times a day (TID) | INTRAMUSCULAR | Status: DC | PRN

## 2017-03-22 MED ORDER — CEFAZOLIN SODIUM-DEXTROSE 2-4 GM/100ML-% IV SOLN
2.0000 g | Freq: Four times a day (QID) | INTRAVENOUS | Status: AC
  Administered 2017-03-22 (×2): 2 g via INTRAVENOUS
  Filled 2017-03-22 (×2): qty 100

## 2017-03-22 MED ORDER — ROCURONIUM BROMIDE 50 MG/5ML IV SOSY
PREFILLED_SYRINGE | INTRAVENOUS | Status: AC
  Filled 2017-03-22: qty 10

## 2017-03-22 MED ORDER — ALUM & MAG HYDROXIDE-SIMETH 200-200-20 MG/5ML PO SUSP: 30.0000 mL | ORAL | Status: DC | PRN

## 2017-03-22 MED ORDER — HYDROMORPHONE HCL-NACL 0.5-0.9 MG/ML-% IV SOSY
PREFILLED_SYRINGE | INTRAVENOUS | Status: AC
  Filled 2017-03-22: qty 1

## 2017-03-22 MED ORDER — SODIUM CHLORIDE 0.9 % IR SOLN
Status: DC | PRN
  Administered 2017-03-22: 1000 mL

## 2017-03-22 MED ORDER — SENNOSIDES-DOCUSATE SODIUM 8.6-50 MG PO TABS: 1.0000 | ORAL_TABLET | Freq: Every evening | ORAL | Status: DC | PRN

## 2017-03-22 MED ORDER — DIPHENHYDRAMINE HCL 12.5 MG/5ML PO ELIX: 12.5000 mg | ORAL_SOLUTION | ORAL | Status: DC | PRN

## 2017-03-22 MED ORDER — SUCCINYLCHOLINE CHLORIDE 200 MG/10ML IV SOSY
PREFILLED_SYRINGE | INTRAVENOUS | Status: DC | PRN
  Administered 2017-03-22: 160 mg via INTRAVENOUS

## 2017-03-22 MED ORDER — DOCUSATE SODIUM 100 MG PO CAPS
100.0000 mg | ORAL_CAPSULE | Freq: Two times a day (BID) | ORAL | Status: DC
  Administered 2017-03-22 – 2017-03-24 (×4): 100 mg via ORAL
  Filled 2017-03-22 (×4): qty 1

## 2017-03-22 MED ORDER — PHENYLEPHRINE HCL 10 MG/ML IJ SOLN
INTRAMUSCULAR | Status: AC
  Filled 2017-03-22: qty 1

## 2017-03-22 MED ORDER — OXYCODONE HCL 5 MG PO TABS
5.0000 mg | ORAL_TABLET | ORAL | Status: DC | PRN
  Administered 2017-03-22 – 2017-03-23 (×2): 5 mg via ORAL
  Administered 2017-03-23 (×2): 10 mg via ORAL
  Administered 2017-03-23: 5 mg via ORAL
  Administered 2017-03-24 (×2): 10 mg via ORAL
  Filled 2017-03-22: qty 2
  Filled 2017-03-22: qty 1
  Filled 2017-03-22 (×2): qty 2
  Filled 2017-03-22 (×2): qty 1
  Filled 2017-03-22: qty 2

## 2017-03-22 MED ORDER — HYDROMORPHONE HCL-NACL 0.5-0.9 MG/ML-% IV SOSY
PREFILLED_SYRINGE | INTRAVENOUS | Status: AC
  Filled 2017-03-22: qty 2

## 2017-03-22 MED ORDER — FENTANYL CITRATE (PF) 100 MCG/2ML IJ SOLN
INTRAMUSCULAR | Status: DC | PRN
  Administered 2017-03-22: 50 ug via INTRAVENOUS
  Administered 2017-03-22: 100 ug via INTRAVENOUS
  Administered 2017-03-22 (×3): 50 ug via INTRAVENOUS

## 2017-03-22 MED ORDER — METOCLOPRAMIDE HCL 5 MG PO TABS: 5.0000 mg | ORAL_TABLET | Freq: Three times a day (TID) | ORAL | Status: DC | PRN

## 2017-03-22 MED ORDER — ASPIRIN EC 325 MG PO TBEC: 325.0000 mg | DELAYED_RELEASE_TABLET | Freq: Two times a day (BID) | ORAL | 0 refills | Status: AC

## 2017-03-22 MED ORDER — LACTATED RINGERS IV SOLN
INTRAVENOUS | Status: DC
  Administered 2017-03-22 (×4): via INTRAVENOUS

## 2017-03-22 MED ORDER — FENTANYL CITRATE (PF) 100 MCG/2ML IJ SOLN
100.0000 ug | Freq: Once | INTRAMUSCULAR | Status: AC
  Administered 2017-03-22 (×2): 50 ug via INTRAVENOUS

## 2017-03-22 MED ORDER — RANOLAZINE ER 500 MG PO TB12
1000.0000 mg | ORAL_TABLET | Freq: Two times a day (BID) | ORAL | Status: DC
  Administered 2017-03-22 – 2017-03-24 (×4): 1000 mg via ORAL
  Filled 2017-03-22 (×5): qty 2

## 2017-03-22 MED ORDER — MENTHOL 3 MG MT LOZG
1.0000 | LOZENGE | OROMUCOSAL | Status: DC | PRN
  Filled 2017-03-22: qty 9

## 2017-03-22 MED ORDER — KETOROLAC TROMETHAMINE 30 MG/ML IJ SOLN
INTRAMUSCULAR | Status: AC
  Filled 2017-03-22: qty 1

## 2017-03-22 MED ORDER — ALBUMIN HUMAN 5 % IV SOLN
INTRAVENOUS | Status: DC | PRN
  Administered 2017-03-22: 12:00:00 via INTRAVENOUS

## 2017-03-22 MED ORDER — OXYCODONE HCL 5 MG PO TABS: 5.0000 mg | ORAL_TABLET | ORAL | 0 refills | Status: AC | PRN

## 2017-03-22 MED ORDER — CLOPIDOGREL BISULFATE 75 MG PO TABS
75.0000 mg | ORAL_TABLET | Freq: Every day | ORAL | Status: DC
  Administered 2017-03-23 – 2017-03-24 (×2): 75 mg via ORAL
  Filled 2017-03-22 (×2): qty 1

## 2017-03-22 MED ORDER — BUPIVACAINE-EPINEPHRINE (PF) 0.25% -1:200000 IJ SOLN
INTRAMUSCULAR | Status: AC
  Filled 2017-03-22: qty 30

## 2017-03-22 MED ORDER — PRAVASTATIN SODIUM 40 MG PO TABS
80.0000 mg | ORAL_TABLET | Freq: Every day | ORAL | Status: DC
  Administered 2017-03-22 – 2017-03-23 (×2): 80 mg via ORAL
  Filled 2017-03-22 (×2): qty 2

## 2017-03-22 MED ORDER — SUGAMMADEX SODIUM 500 MG/5ML IV SOLN
INTRAVENOUS | Status: DC | PRN
  Administered 2017-03-22: 300 mg via INTRAVENOUS

## 2017-03-22 MED ORDER — SODIUM CHLORIDE 0.9 % IJ SOLN
INTRAMUSCULAR | Status: AC
  Filled 2017-03-22: qty 50

## 2017-03-22 MED ORDER — MIDAZOLAM HCL 2 MG/2ML IJ SOLN
INTRAMUSCULAR | Status: AC
  Administered 2017-03-22: 2 mg via INTRAVENOUS
  Filled 2017-03-22: qty 2

## 2017-03-22 MED ORDER — SUCCINYLCHOLINE CHLORIDE 200 MG/10ML IV SOSY
PREFILLED_SYRINGE | INTRAVENOUS | Status: AC
  Filled 2017-03-22: qty 10

## 2017-03-22 MED ORDER — CHLORHEXIDINE GLUCONATE 4 % EX LIQD
60.0000 mL | Freq: Once | CUTANEOUS | Status: DC
  Filled 2017-03-22: qty 60

## 2017-03-22 MED ORDER — ACETAMINOPHEN 325 MG PO TABS: 650.0000 mg | ORAL_TABLET | Freq: Four times a day (QID) | ORAL | Status: DC | PRN

## 2017-03-22 MED ORDER — ROCURONIUM BROMIDE 50 MG/5ML IV SOSY
PREFILLED_SYRINGE | INTRAVENOUS | Status: AC
  Filled 2017-03-22: qty 5

## 2017-03-22 MED ORDER — HYDROMORPHONE HCL-NACL 0.5-0.9 MG/ML-% IV SOSY
0.2500 mg | PREFILLED_SYRINGE | INTRAVENOUS | Status: DC | PRN
  Administered 2017-03-22 (×3): 0.5 mg via INTRAVENOUS

## 2017-03-22 MED ORDER — ONDANSETRON HCL 4 MG/2ML IJ SOLN: 4.0000 mg | Freq: Four times a day (QID) | INTRAMUSCULAR | Status: DC | PRN

## 2017-03-22 MED ORDER — LIDOCAINE 2% (20 MG/ML) 5 ML SYRINGE
INTRAMUSCULAR | Status: AC
  Filled 2017-03-22: qty 10

## 2017-03-22 MED ORDER — PHENYLEPHRINE 40 MCG/ML (10ML) SYRINGE FOR IV PUSH (FOR BLOOD PRESSURE SUPPORT)
PREFILLED_SYRINGE | INTRAVENOUS | Status: DC | PRN
  Administered 2017-03-22 (×4): 80 ug via INTRAVENOUS

## 2017-03-22 MED ORDER — ISOSORBIDE MONONITRATE ER 30 MG PO TB24
30.0000 mg | ORAL_TABLET | Freq: Every day | ORAL | Status: DC
  Administered 2017-03-23 – 2017-03-24 (×2): 30 mg via ORAL
  Filled 2017-03-22 (×2): qty 1

## 2017-03-22 MED ORDER — BUPIVACAINE-EPINEPHRINE 0.25% -1:200000 IJ SOLN
INTRAMUSCULAR | Status: DC | PRN
  Administered 2017-03-22: 30 mL

## 2017-03-22 MED ORDER — METHOCARBAMOL 1000 MG/10ML IJ SOLN
500.0000 mg | Freq: Four times a day (QID) | INTRAVENOUS | Status: DC | PRN
  Filled 2017-03-22: qty 5

## 2017-03-22 MED ORDER — PROPOFOL 10 MG/ML IV BOLUS
INTRAVENOUS | Status: AC
  Filled 2017-03-22: qty 40

## 2017-03-22 MED ORDER — SODIUM CHLORIDE 0.9 % IV SOLN
INTRAVENOUS | Status: DC
  Administered 2017-03-22 – 2017-03-23 (×3): via INTRAVENOUS

## 2017-03-22 MED ORDER — LOSARTAN POTASSIUM 50 MG PO TABS
50.0000 mg | ORAL_TABLET | Freq: Every day | ORAL | Status: DC
  Administered 2017-03-23 – 2017-03-24 (×2): 50 mg via ORAL
  Filled 2017-03-22 (×2): qty 1

## 2017-03-22 MED ORDER — AMLODIPINE BESYLATE 10 MG PO TABS
10.0000 mg | ORAL_TABLET | Freq: Every evening | ORAL | Status: DC
  Filled 2017-03-22: qty 1

## 2017-03-22 MED ORDER — MIDAZOLAM HCL 2 MG/2ML IJ SOLN
2.0000 mg | Freq: Once | INTRAMUSCULAR | Status: AC
  Administered 2017-03-22: 2 mg via INTRAVENOUS

## 2017-03-22 MED ORDER — METHOCARBAMOL 500 MG PO TABS: 500.0000 mg | ORAL_TABLET | Freq: Four times a day (QID) | ORAL | Status: DC | PRN

## 2017-03-22 MED ORDER — NITROGLYCERIN 0.4 MG SL SUBL: 0.4000 mg | SUBLINGUAL_TABLET | SUBLINGUAL | Status: DC | PRN

## 2017-03-22 MED ORDER — DEXAMETHASONE SODIUM PHOSPHATE 10 MG/ML IJ SOLN
10.0000 mg | Freq: Once | INTRAMUSCULAR | Status: AC
  Administered 2017-03-22: 10 mg via INTRAVENOUS

## 2017-03-22 MED ORDER — PHENYLEPHRINE HCL 10 MG/ML IJ SOLN
INTRAVENOUS | Status: DC | PRN
  Administered 2017-03-22: 25 ug/min via INTRAVENOUS

## 2017-03-22 MED ORDER — LIDOCAINE 2% (20 MG/ML) 5 ML SYRINGE
INTRAMUSCULAR | Status: AC
  Filled 2017-03-22: qty 5

## 2017-03-22 MED ORDER — FLEET ENEMA 7-19 GM/118ML RE ENEM: 1.0000 | ENEMA | Freq: Once | RECTAL | Status: DC | PRN

## 2017-03-22 MED ORDER — FENTANYL CITRATE (PF) 100 MCG/2ML IJ SOLN
INTRAMUSCULAR | Status: AC
  Administered 2017-03-22: 50 ug via INTRAVENOUS
  Filled 2017-03-22: qty 2

## 2017-03-22 MED ORDER — ROCURONIUM BROMIDE 10 MG/ML (PF) SYRINGE
PREFILLED_SYRINGE | INTRAVENOUS | Status: DC | PRN
  Administered 2017-03-22: 50 mg via INTRAVENOUS

## 2017-03-22 MED ORDER — SUGAMMADEX SODIUM 500 MG/5ML IV SOLN
INTRAVENOUS | Status: AC
  Filled 2017-03-22: qty 5

## 2017-03-22 MED ORDER — EZETIMIBE 10 MG PO TABS
10.0000 mg | ORAL_TABLET | Freq: Every evening | ORAL | Status: DC
  Administered 2017-03-22 – 2017-03-23 (×2): 10 mg via ORAL
  Filled 2017-03-22 (×2): qty 1

## 2017-03-22 MED ORDER — METHOCARBAMOL 500 MG PO TABS: 500.0000 mg | ORAL_TABLET | Freq: Three times a day (TID) | ORAL | 2 refills | Status: DC | PRN

## 2017-03-22 MED ORDER — EPHEDRINE SULFATE-NACL 50-0.9 MG/10ML-% IV SOSY
PREFILLED_SYRINGE | INTRAVENOUS | Status: DC | PRN
  Administered 2017-03-22: 5 mg via INTRAVENOUS
  Administered 2017-03-22 (×3): 10 mg via INTRAVENOUS

## 2017-03-22 MED ORDER — ENOXAPARIN SODIUM 30 MG/0.3ML ~~LOC~~ SOLN
30.0000 mg | Freq: Two times a day (BID) | SUBCUTANEOUS | Status: DC
  Administered 2017-03-23 – 2017-03-24 (×3): 30 mg via SUBCUTANEOUS
  Filled 2017-03-22 (×3): qty 0.3

## 2017-03-22 MED ORDER — PROPOFOL 10 MG/ML IV BOLUS
INTRAVENOUS | Status: DC | PRN
  Administered 2017-03-22: 200 mg via INTRAVENOUS

## 2017-03-22 MED ORDER — PHENYLEPHRINE 40 MCG/ML (10ML) SYRINGE FOR IV PUSH (FOR BLOOD PRESSURE SUPPORT)
PREFILLED_SYRINGE | INTRAVENOUS | Status: AC
  Filled 2017-03-22: qty 10

## 2017-03-22 MED ORDER — ONDANSETRON HCL 4 MG/2ML IJ SOLN
INTRAMUSCULAR | Status: AC
  Filled 2017-03-22: qty 2

## 2017-03-22 MED ORDER — FERROUS SULFATE 325 (65 FE) MG PO TABS
325.0000 mg | ORAL_TABLET | Freq: Three times a day (TID) | ORAL | Status: DC
  Administered 2017-03-23 – 2017-03-24 (×5): 325 mg via ORAL
  Filled 2017-03-22 (×5): qty 1

## 2017-03-22 MED ORDER — ONDANSETRON HCL 4 MG/2ML IJ SOLN
INTRAMUSCULAR | Status: DC | PRN
  Administered 2017-03-22: 4 mg via INTRAVENOUS

## 2017-03-22 MED ORDER — ONDANSETRON HCL 4 MG PO TABS: 4.0000 mg | ORAL_TABLET | Freq: Four times a day (QID) | ORAL | Status: DC | PRN

## 2017-03-22 MED ORDER — PHENOL 1.4 % MT LIQD
1.0000 | OROMUCOSAL | Status: DC | PRN
  Filled 2017-03-22: qty 177

## 2017-03-22 MED ORDER — LIDOCAINE 2% (20 MG/ML) 5 ML SYRINGE
INTRAMUSCULAR | Status: DC | PRN
  Administered 2017-03-22: 80 mg via INTRAVENOUS

## 2017-03-22 MED ORDER — BISACODYL 5 MG PO TBEC
5.0000 mg | DELAYED_RELEASE_TABLET | Freq: Every day | ORAL | Status: DC | PRN
Start: 2017-03-22 — End: 2017-03-24

## 2017-03-22 MED ORDER — VITAMIN D3 25 MCG (1000 UNIT) PO TABS
1000.0000 [IU] | ORAL_TABLET | Freq: Every day | ORAL | Status: DC
  Administered 2017-03-23 – 2017-03-24 (×2): 1000 [IU] via ORAL
  Filled 2017-03-22 (×2): qty 1

## 2017-03-22 SURGICAL SUPPLY — 57 items
BAG DECANTER FOR FLEXI CONT (MISCELLANEOUS) IMPLANT
BAG ZIPLOCK 12X15 (MISCELLANEOUS) ×2 IMPLANT
BANDAGE ACE 4X5 VEL STRL LF (GAUZE/BANDAGES/DRESSINGS) ×2 IMPLANT
BANDAGE ACE 6X5 VEL STRL LF (GAUZE/BANDAGES/DRESSINGS) ×2 IMPLANT
BLADE SAG 18X100X1.27 (BLADE) ×2 IMPLANT
BLADE SAW SGTL 13.0X1.19X90.0M (BLADE) ×2 IMPLANT
BOWL SMART MIX CTS (DISPOSABLE) ×2 IMPLANT
CAP KNEE TOTAL 3 SIGMA ×2 IMPLANT
CEMENT HV SMART SET (Cement) ×4 IMPLANT
COVER SURGICAL LIGHT HANDLE (MISCELLANEOUS) ×2 IMPLANT
CUFF TOURN SGL QUICK 34 (TOURNIQUET CUFF) ×1
CUFF TRNQT CYL 34X4X40X1 (TOURNIQUET CUFF) ×1 IMPLANT
DECANTER SPIKE VIAL GLASS SM (MISCELLANEOUS) ×2 IMPLANT
DERMABOND ADVANCED (GAUZE/BANDAGES/DRESSINGS) ×1
DERMABOND ADVANCED .7 DNX12 (GAUZE/BANDAGES/DRESSINGS) ×1 IMPLANT
DRAPE U-SHAPE 47X51 STRL (DRAPES) ×2 IMPLANT
DRSG AQUACEL AG ADV 3.5X10 (GAUZE/BANDAGES/DRESSINGS) ×2 IMPLANT
DRSG TEGADERM 4X4.75 (GAUZE/BANDAGES/DRESSINGS) ×2 IMPLANT
DURAPREP 26ML APPLICATOR (WOUND CARE) ×4 IMPLANT
ELECT REM PT RETURN 15FT ADLT (MISCELLANEOUS) ×2 IMPLANT
EVACUATOR 1/8 PVC DRAIN (DRAIN) ×2 IMPLANT
GAUZE SPONGE 2X2 8PLY STRL LF (GAUZE/BANDAGES/DRESSINGS) ×1 IMPLANT
GLOVE BIOGEL PI IND STRL 8 (GLOVE) ×2 IMPLANT
GLOVE BIOGEL PI INDICATOR 8 (GLOVE) ×2
GLOVE ECLIPSE 8.0 STRL XLNG CF (GLOVE) ×4 IMPLANT
GLOVE SURG ORTHO 9.0 STRL STRW (GLOVE) ×2 IMPLANT
GLOVE SURG SS PI 7.5 STRL IVOR (GLOVE) ×4 IMPLANT
GOWN STRL REUS W/TWL XL LVL3 (GOWN DISPOSABLE) ×4 IMPLANT
HANDPIECE INTERPULSE COAX TIP (DISPOSABLE) ×1
IMMOBILIZER KNEE 20 (SOFTGOODS) ×2 IMPLANT
IMMOBILIZER KNEE 20 THIGH 36 (SOFTGOODS) IMPLANT
NS IRRIG 1000ML POUR BTL (IV SOLUTION) ×2 IMPLANT
PACK TOTAL KNEE CUSTOM (KITS) ×2 IMPLANT
POSITIONER SURGICAL ARM (MISCELLANEOUS) ×2 IMPLANT
SET HNDPC FAN SPRY TIP SCT (DISPOSABLE) ×1 IMPLANT
SET PAD KNEE POSITIONER (MISCELLANEOUS) ×2 IMPLANT
SPONGE GAUZE 2X2 STER 10/PKG (GAUZE/BANDAGES/DRESSINGS) ×1
SPONGE LAP 18X18 X RAY DECT (DISPOSABLE) IMPLANT
SPONGE SURGIFOAM ABS GEL 100 (HEMOSTASIS) ×2 IMPLANT
STOCKINETTE 6  STRL (DRAPES) ×1
STOCKINETTE 6 STRL (DRAPES) ×1 IMPLANT
SUCTION FRAZIER HANDLE 12FR (TUBING)
SUCTION TUBE FRAZIER 12FR DISP (TUBING) IMPLANT
SUT BONE WAX W31G (SUTURE) IMPLANT
SUT MNCRL AB 3-0 PS2 18 (SUTURE) ×2 IMPLANT
SUT VIC AB 1 CT1 27 (SUTURE) ×4
SUT VIC AB 1 CT1 27XBRD ANTBC (SUTURE) ×4 IMPLANT
SUT VIC AB 2-0 CT1 27 (SUTURE) ×2
SUT VIC AB 2-0 CT1 TAPERPNT 27 (SUTURE) ×2 IMPLANT
SUT VLOC 180 0 24IN GS25 (SUTURE) ×2 IMPLANT
SYR 50ML LL SCALE MARK (SYRINGE) ×2 IMPLANT
TAPE STRIPS DRAPE STRL (GAUZE/BANDAGES/DRESSINGS) ×2 IMPLANT
TRAY FOLEY W/METER SILVER 16FR (SET/KITS/TRAYS/PACK) ×2 IMPLANT
WATER STERILE IRR 1000ML POUR (IV SOLUTION) ×4 IMPLANT
WATER STERILE IRR 1500ML POUR (IV SOLUTION) IMPLANT
WRAP KNEE MAXI GEL POST OP (GAUZE/BANDAGES/DRESSINGS) ×2 IMPLANT
YANKAUER SUCT BULB TIP 10FT TU (MISCELLANEOUS) ×2 IMPLANT

## 2017-03-22 NOTE — Transfer of Care (Signed)
Immediate Anesthesia Transfer of Care Note  Patient: Craig Copeland  Procedure(s) Performed: Procedure(s): LEFT TOTAL KNEE ARTHROPLASTY (Left)  Patient Location: PACU  Anesthesia Type:GA combined with regional for post-op pain  Level of Consciousness:  sedated, patient cooperative and responds to stimulation  Airway & Oxygen Therapy:Patient Spontanous Breathing and Patient connected to face mask oxgen  Post-op Assessment:  Report given to PACU RN and Post -op Vital signs reviewed and stable  Post vital signs:  Reviewed and stable  Last Vitals:  Vitals:   03/22/17 0956 03/22/17 1001  BP: (!) 105/58 (!) 118/53  Pulse: 68 68  Resp: 12 18  Temp:    SpO2: 99% 99%    Complications: No apparent anesthesia complications

## 2017-03-22 NOTE — Progress Notes (Signed)
AssistedDr. Edwards with left, ultrasound guided, adductor canal block. Side rails up, monitors on throughout procedure. See vital signs in flow sheet. Tolerated Procedure well.  

## 2017-03-22 NOTE — Anesthesia Preprocedure Evaluation (Addendum)
Anesthesia Evaluation  Patient identified by MRN, date of birth, ID band Patient awake    Reviewed: Allergy & Precautions, NPO status , Patient's Chart, lab work & pertinent test results  Airway Mallampati: II  TM Distance: >3 FB     Dental   Pulmonary sleep apnea ,    breath sounds clear to auscultation       Cardiovascular hypertension, + angina + CAD  + Valvular Problems/Murmurs  Rhythm:Regular Rate:Normal     Neuro/Psych    GI/Hepatic negative GI ROS, Neg liver ROS,   Endo/Other  negative endocrine ROS  Renal/GU negative Renal ROS     Musculoskeletal   Abdominal   Peds  Hematology   Anesthesia Other Findings   Reproductive/Obstetrics                            Anesthesia Physical Anesthesia Plan  ASA: III  Anesthesia Plan: General   Post-op Pain Management:  Regional for Post-op pain   Induction: Intravenous  PONV Risk Score and Plan: 2 and Ondansetron and Dexamethasone  Airway Management Planned: Oral ETT  Additional Equipment:   Intra-op Plan:   Post-operative Plan: Extubation in OR  Informed Consent: I have reviewed the patients History and Physical, chart, labs and discussed the procedure including the risks, benefits and alternatives for the proposed anesthesia with the patient or authorized representative who has indicated his/her understanding and acceptance.   Dental advisory given  Plan Discussed with: CRNA, Anesthesiologist and Surgeon  Anesthesia Plan Comments:       Anesthesia Quick Evaluation

## 2017-03-22 NOTE — Op Note (Signed)
DATE OF SURGERY:  03/22/2017  TIME: 12:38 PM  PATIENT NAME:  Craig Copeland    AGE: 61 y.o.   PRE-OPERATIVE DIAGNOSIS:  left knee osteoarthritis  POST-OPERATIVE DIAGNOSIS:  left knee osteoarthritis  PROCEDURE:  Procedure(s): LEFT TOTAL KNEE ARTHROPLASTY  SURGEON:  Deny Chevez ANDREW  ASSISTANT:  Bryson Stilwell, PA-C, present and scrubbed throughout the case, critical for assistance with exposure, retraction, instrumentation, and closure.  OPERATIVE IMPLANTS: Depuy PFC Sigma Rotating Platform.  Femur size 5, Tibia size 5, Patella size 38 3-peg oval button, with a 10 mm polyethylene insert.   PREOPERATIVE INDICATIONS:   Craig Copeland is a 61 y.o. year old male with end stage bone on bone arthritis of the knee who failed conservative treatment and elected for Total Knee Arthroplasty.   The risks, benefits, and alternatives were discussed at length including but not limited to the risks of infection, bleeding, nerve injury, stiffness, blood clots, the need for revision surgery, cardiopulmonary complications, among others, and they were willing to proceed.  OPERATIVE DESCRIPTION:  The patient was brought to the operative room and placed in a supine position.  Spinal anesthesia was administered.  IV antibiotics were given.  The lower extremity was prepped and draped in the usual sterile fashion.  Time out was performed.  The leg was elevated and exsanguinated and the tourniquet was inflated.  Anterior quadriceps tendon splitting approach was performed.  The patella was retracted and osteophytes were removed.  The anterior horn of the medial and lateral meniscus was removed and cruciate ligaments resected.   The distal femur was opened with the drill and the intramedullary distal femoral cutting jig was utilized, set at 5 degrees resecting 10 mm off the distal femur.  Care was taken to protect the collateral ligaments.  The distal femoral sizing jig was applied, taking care to  avoid notching.  Then the 4-in-1 cutting jig was applied and the anterior and posterior femur was cut, along with the chamfer cuts.    Then the extramedullary tibial cutting jig was utilized making the appropriate cut using the anterior tibial crest as a reference building in appropriate posterior slope.  Care was taken during the cut to protect the medial and collateral ligaments.  The proximal tibia was removed along with the posterior horns of the menisci.   The posterior medial femoral osteophytes and posterior lateral femoral osteophytes were removed.    The flexion gap was then measured and was symmetric with the extension gap, measured at 10.  I completed the distal femoral preparation using the appropriate jig to prepare the box.  The patella was then measured, and cut with the saw.    The proximal tibia sized and prepared accordingly with the reamer and the punch, and then all components were trialed with the trial insert.  The knee was found to have excellent balance and full motion.    The above named components were then cemented into place and all excess cement was removed.  The trial polyethylene component was in place during cementation, and then was exchanged for the real polyethylene component.    The knee was easily taken through a range of motion and the patella tracked well and the knee irrigated copiously and the parapatellar and subcutaneous tissue closed with vicryl, and monocryl with steri strips for the skin.  The arthrotomy was closed at 90 of flexion. The wounds were dressed with sterile gauze and the tourniquet released and the patient was awakened and returned to the PACU  in stable and satisfactory condition.  There were no complications.  Total tourniquet time was 90 minutes.

## 2017-03-22 NOTE — Anesthesia Procedure Notes (Signed)
Procedure Name: Intubation Date/Time: 03/22/2017 10:38 AM Performed by: Nikky Duba, Nuala Alpha Pre-anesthesia Checklist: Patient identified, Emergency Drugs available, Suction available and Patient being monitored Patient Re-evaluated:Patient Re-evaluated prior to induction Oxygen Delivery Method: Circle System Utilized Preoxygenation: Pre-oxygenation with 100% oxygen Induction Type: IV induction Ventilation: Two handed mask ventilation required Laryngoscope Size: Miller and 2 Grade View: Grade I Tube type: Oral Tube size: 7.5 mm Number of attempts: 1 Airway Equipment and Method: Stylet and Oral airway Placement Confirmation: ETT inserted through vocal cords under direct vision,  positive ETCO2 and breath sounds checked- equal and bilateral Secured at: 23 cm Tube secured with: Tape Dental Injury: Teeth and Oropharynx as per pre-operative assessment

## 2017-03-22 NOTE — Anesthesia Postprocedure Evaluation (Signed)
Anesthesia Post Note  Patient: Craig Copeland  Procedure(s) Performed: Procedure(s) (LRB): LEFT TOTAL KNEE ARTHROPLASTY (Left)     Patient location during evaluation: PACU Anesthesia Type: General Level of consciousness: awake Pain management: pain level controlled Respiratory status: spontaneous breathing Cardiovascular status: stable Postop Assessment: no apparent nausea or vomiting Anesthetic complications: no    Last Vitals:  Vitals:   03/22/17 1415 03/22/17 1430  BP: 111/60 108/70  Pulse: 87 82  Resp: 16 16  Temp: 36.9 C 36.6 C  SpO2: 91% 93%    Last Pain:  Vitals:   03/22/17 1415  TempSrc:   PainSc: 0-No pain                 Brendyn Mclaren

## 2017-03-22 NOTE — Progress Notes (Signed)
Patient placed on CPAP without any complication. RT will continue to monitor as needed.

## 2017-03-22 NOTE — Interval H&P Note (Signed)
History and Physical Interval Note:  03/22/2017 9:35 AM  Craig Copeland  has presented today for surgery, with the diagnosis of left knee osteoarthritis  The various methods of treatment have been discussed with the patient and family. After consideration of risks, benefits and other options for treatment, the patient has consented to  Procedure(s): LEFT TOTAL KNEE ARTHROPLASTY (Left) as a surgical intervention .  The patient's history has been reviewed, patient examined, no change in status, stable for surgery.  I have reviewed the patient's chart and labs.  Questions were answered to the patient's satisfaction.     Craig Copeland ANDREW

## 2017-03-22 NOTE — Anesthesia Procedure Notes (Signed)
Anesthesia Regional Block: Adductor canal block   Pre-Anesthetic Checklist: ,, timeout performed, Correct Patient, Correct Site, Correct Laterality, Correct Procedure, Correct Position, site marked, Risks and benefits discussed,  Surgical consent,  Pre-op evaluation,  At surgeon's request and post-op pain management  Laterality: Left  Prep: chloraprep       Needles:  Injection technique: Single-shot  Needle Type: Stimulator Needle - 80          Additional Needles:   Procedures:,,,, ultrasound used (permanent image in chart),,,,  Narrative:  Start time: 03/22/2017 9:40 AM End time: 03/22/2017 9:55 AM Injection made incrementally with aspirations every 5 mL.  Performed by: Personally  Anesthesiologist: Dorris Singh

## 2017-03-23 LAB — BASIC METABOLIC PANEL
ANION GAP: 8 (ref 5–15)
BUN: 16 mg/dL (ref 6–20)
CALCIUM: 8.3 mg/dL — AB (ref 8.9–10.3)
CO2: 25 mmol/L (ref 22–32)
Chloride: 105 mmol/L (ref 101–111)
Creatinine, Ser: 1.03 mg/dL (ref 0.61–1.24)
GFR calc Af Amer: >60 mL/min (ref >60)
GLUCOSE: 146 mg/dL — AB (ref 65–99)
Potassium: 4.1 mmol/L (ref 3.5–5.1)
Sodium: 138 mmol/L (ref 135–145)

## 2017-03-23 LAB — CBC
HEMATOCRIT: 33.3 % — AB (ref 39.0–52.0)
HEMOGLOBIN: 11.1 g/dL — AB (ref 13.0–17.0)
MCH: 30.3 pg (ref 26.0–34.0)
MCHC: 33.3 g/dL (ref 30.0–36.0)
MCV: 91 fL (ref 78.0–100.0)
PLATELETS: 172 10*3/uL (ref 150–400)
RBC: 3.66 MIL/uL — ABNORMAL LOW (ref 4.22–5.81)
RDW: 13.2 % (ref 11.5–15.5)
WBC: 12.9 10*3/uL — ABNORMAL HIGH (ref 4.0–10.5)

## 2017-03-23 LAB — ABO/RH: ABO/RH(D): A POS

## 2017-03-23 NOTE — Progress Notes (Signed)
Patient refused CPAP for HS. States he prefers to use his oxygen. Encouraged pt to call for Respiratory if he desires to wear CPAP HS.

## 2017-03-23 NOTE — Progress Notes (Signed)
Occupational Therapy Evaluation Patient Details Name: Craig Copeland MRN: [id number] DOB: [birth year] Today's Date: 03/23/2017    History of Present Illness Pt is a 61 year old male s/p L TKA with hx of CAD, hypertension, obesity, OSA   Clinical Impression   Limited evaluation today due to decreased BP and increased pain. Will continue to address ADL techniques next treatment session.    Follow Up Recommendations  No OT follow up;Supervision/Assistance - 24 hour    Equipment Recommendations  None recommended by OT    Recommendations for Other Services       Precautions / Restrictions Precautions Precautions: Fall;Knee Required Braces or Orthoses: Knee Immobilizer - Left Restrictions Other Position/Activity Restrictions: WBAT      Mobility Bed Mobility              Transfers                 Balance                                           ADL either performed or assessed with clinical judgement   ADL Overall ADL's : Needs assistance/impaired Eating/Feeding: Independent   Grooming: Set up;Bed level                                 General ADL Comments: Pt's BP was 409/81 recently and complaining of increased pain/need for pain meds. Declined EOB/OOB activities during eval, but states he worked with PT earlier and went to the bathroom earlier. Will continue to follow.     Vision         Perception     Praxis      Pertinent Vitals/Pain Pain Assessment: 0-10 Pain Score: 7  Pain Location: L knee Pain Descriptors / Indicators: Aching;Sore Pain Intervention(s): Limited activity within patient's tolerance;Patient requesting pain meds-RN notified     Hand Dominance     Extremity/Trunk Assessment Upper Extremity Assessment Upper Extremity Assessment: Overall WFL for tasks assessed   Lower Extremity Assessment Lower Extremity Assessment: Defer to PT evaluation LLE Deficits / Details: unable to perform  SLR, ROM TBA       Communication Communication Communication: No difficulties   Cognition Arousal/Alertness: Awake/alert Behavior During Therapy: WFL for tasks assessed/performed Overall Cognitive Status: Within Functional Limits for tasks assessed                                     General Comments       Exercises     Shoulder Instructions      Home Living Family/patient expects to be discharged to:: Private residence Living Arrangements: Spouse/significant other   Type of Home: House Home Access: Stairs to enter Secretary/administrator of Steps: 2   Home Layout: One level     Bathroom Shower/Tub: Producer, television/film/video: Handicapped height Bathroom Accessibility: Yes How Accessible: Accessible via walker Home Equipment: Walker - 2 wheels          Prior Functioning/Environment Level of Independence: Independent                 OT Problem List: Decreased strength;Decreased range of motion;Decreased activity tolerance;Decreased knowledge of use of DME or AE;Pain  OT Treatment/Interventions: Self-care/ADL training;DME and/or AE instruction;Therapeutic activities;Patient/family education    OT Goals(Current goals can be found in the care plan section) Acute Rehab OT Goals Patient Stated Goal: decreased pain OT Goal Formulation: With patient Time For Goal Achievement: 03/30/17 Potential to Achieve Goals: Good  OT Frequency: Min 2X/week   Barriers to D/C:            Co-evaluation              AM-PAC PT "6 Clicks" Daily Activity     Outcome Measure Help from another person eating meals?: None Help from another person taking care of personal grooming?: A Little Help from another person toileting, which includes using toliet, bedpan, or urinal?: A Little Help from another person bathing (including washing, rinsing, drying)?: A Lot Help from another person to put on and taking off regular upper body clothing?:  None Help from another person to put on and taking off regular lower body clothing?: A Lot 6 Click Score: 18   End of Session Nurse Communication: Patient requests pain meds  Activity Tolerance: Patient limited by pain Patient left: in bed;with call bell/phone within reach;with family/visitor present  OT Visit Diagnosis: Unsteadiness on feet (R26.81);Muscle weakness (generalized) (M62.81);Pain Pain - Right/Left: Left Pain - part of body: Knee                Time: 1320-1330 OT Time Calculation (min): 10 min Charges:  OT General Charges $OT Visit: 1 Visit OT Evaluation $OT Eval Low Complexity: 1 Low G-Codes:       Craig Copeland 03/26/2017, 2:56 PM

## 2017-03-23 NOTE — Progress Notes (Signed)
Physical Therapy Treatment Patient Details Name: Craig Copeland MRN: [id number] DOB: 1956/01/30 Today's Date: 03/23/2017    History of Present Illness Pt is a 61 year old male s/p L TKA with hx of CAD, hypertension, obesity, OSA    PT Comments    Pt ambulated again this afternoon and then assisted back to bed.  Pt performed knee flexion and extension exercises.  Pt plans to d/c home tomorrow.  Follow Up Recommendations  Home health PT;DC plan and follow up therapy as arranged by surgeon     Equipment Recommendations  None recommended by PT    Recommendations for Other Services       Precautions / Restrictions Precautions Precautions: Fall;Knee Required Braces or Orthoses: Knee Immobilizer - Left Restrictions Other Position/Activity Restrictions: WBAT    Mobility  Bed Mobility Overal bed mobility: Needs Assistance Bed Mobility: Supine to Sit;Sit to Supine     Supine to sit: Min guard Sit to supine: Min guard   General bed mobility comments: able to self assist LE  Transfers Overall transfer level: Needs assistance Equipment used: Rolling walker (2 wheeled) Transfers: Sit to/from Stand Sit to Stand: Min guard;From elevated surface         General transfer comment: verbal cues for UE and LE positioning  Ambulation/Gait Ambulation/Gait assistance: Min guard Ambulation Distance (Feet): 60 Feet Assistive device: Rolling walker (2 wheeled) Gait Pattern/deviations: Step-to pattern;Decreased stance time - left;Antalgic     General Gait Details: verbal cues for sequence, RW positioning, step length, pt only wished to ambulate in room so performed a few laps, pt reports mild dizziness which did not become worse   Stairs            Wheelchair Mobility    Modified Rankin (Stroke Patients Only)       Balance                                            Cognition Arousal/Alertness: Awake/alert Behavior During Therapy: WFL for tasks  assessed/performed Overall Cognitive Status: Within Functional Limits for tasks assessed                                        Exercises Total Joint Exercises Quad Sets: AROM;10 reps;Supine;Left Heel Slides: AAROM;Left;10 reps;Supine Goniometric ROM: approx 35* AAROM L knee flexion    General Comments        Pertinent Vitals/Pain Pain Assessment: 0-10 Pain Score: 6  Pain Location: L knee Pain Descriptors / Indicators: Aching;Sore Pain Intervention(s): Repositioned;Premedicated before session;Monitored during session;Limited activity within patient's tolerance    Home Living Family/patient expects to be discharged to:: Private residence Living Arrangements: Spouse/significant other   Type of Home: House Home Access: Stairs to enter   Home Layout: One level Home Equipment: Environmental consultant - 2 wheels      Prior Function Level of Independence: Independent          PT Goals (current goals can now be found in the care plan section) Acute Rehab PT Goals Patient Stated Goal: decreased pain PT Goal Formulation: With patient Time For Goal Achievement: 03/30/17 Potential to Achieve Goals: Good Progress towards PT goals: Progressing toward goals    Frequency    7X/week      PT Plan Current plan remains appropriate  Co-evaluation              AM-PAC PT "6 Clicks" Daily Activity  Outcome Measure  Difficulty turning over in bed (including adjusting bedclothes, sheets and blankets)?: None Difficulty moving from lying on back to sitting on the side of the bed? : A Little Difficulty sitting down on and standing up from a chair with arms (e.g., wheelchair, bedside commode, etc,.)?: Unable Help needed moving to and from a bed to chair (including a wheelchair)?: A Little Help needed walking in hospital room?: A Little Help needed climbing 3-5 steps with a railing? : A Lot 6 Click Score: 16    End of Session Equipment Utilized During Treatment: Gait  belt;Left knee immobilizer Activity Tolerance: Patient tolerated treatment well Patient left: in bed;with call bell/phone within reach;with family/visitor present Nurse Communication: Mobility status PT Visit Diagnosis: Pain;Difficulty in walking, not elsewhere classified (R26.2) Pain - Right/Left: Left Pain - part of body: Knee     Time: 4098-1191 PT Time Calculation (min) (ACUTE ONLY): 22 min  Charges:  $Gait Training: 8-22 mins                    G Codes:      Zenovia Jarred, PT, DPT 03/23/2017 Pager: [phone]    Maida Sale E 03/23/2017, 4:44 PM

## 2017-03-23 NOTE — Progress Notes (Signed)
Around 2245, patient's left knee dressing was found to be very bloody at the base of his knee and around towards the back of his knee. The ACE bandage wrap was fairly saturated with a large amount of blood. Pt c/o minimal pain except for after dangling his legs at the side of the bed as ordered. Even then, pain was not severe. PRN Dilaudid was given as requested and was effective. Hemovac was emptied of 65 ml bloody drainage and a large blood clot was noticed in drain. On-call Avel Peace, PA was made aware of bleeding from site along with the clot in hemovac drain- no new orders were given. Aquacel dressing was reinforced with 2 ABD pads and a new ACE bandage wrap. Pt tolerated well. VSS. Pt resting comfortably in bed at this time, no complaints. Will continue to monitor closely.

## 2017-03-23 NOTE — Progress Notes (Signed)
Craig Copeland  MRN: [id number] DOB/Age: 1956-03-08 61 y.o. Physician: Lynnea Maizes, M.D. 1 Day Post-Op Procedure(s) (LRB): LEFT TOTAL KNEE ARTHROPLASTY (Left)  Subjective: Resting comfortably. No N/V. Vital Signs Temp:  [97.7 F (36.5 C)-98.5 F (36.9 C)] 98.3 F (36.8 C) (09/22 0630) Pulse Rate:  [80-96] 87 (09/22 0630) Resp:  [13-24] 18 (09/22 0630) BP: (104-142)/(52-78) 108/53 (09/22 0630) SpO2:  [91 %-100 %] 96 % (09/22 0630)  Lab Results  Recent Labs  03/23/17 0357  WBC 12.9*  HGB 11.1*  HCT 33.3*  PLT 172   BMET  Recent Labs  03/23/17 0357  NA 138  K 4.1  CL 105  CO2 25  GLUCOSE 146*  BUN 16  CREATININE 1.03  CALCIUM 8.3*   INR  Date Value Ref Range Status  03/08/2017 0.99  Final     Exam  Post op dressing soaked through, Acquacel removed, incision cleaned and new acquacel applied, hemovac d/c'd, N/V intact  Plan Advance per TKA protocol Craig Copeland M Craig Copeland 03/23/2017, 10:39 AM    Contact # [phone]

## 2017-03-23 NOTE — Evaluation (Signed)
Physical Therapy Evaluation Patient Details Name: Craig Copeland MRN: [id number] DOB: [birth year] Today's Date: 03/23/2017   History of Present Illness  Pt is a 61 year old male s/p L TKA with hx of CAD, hypertension, obesity, OSA  Clinical Impression  Pt is s/p TKA resulting in the deficits listed below (see PT Problem List).  Pt will benefit from skilled PT to increase their independence and safety with mobility to allow discharge to the venue listed below.   Pt ambulated in room and assisted to recliner for lunch POD#1.  Pt plans to d/c home tomorrow.     Follow Up Recommendations Home health PT;DC plan and follow up therapy as arranged by surgeon    Equipment Recommendations  None recommended by PT    Recommendations for Other Services       Precautions / Restrictions Precautions Precautions: Fall;Knee Required Braces or Orthoses: Knee Immobilizer - Left Restrictions Other Position/Activity Restrictions: WBAT      Mobility  Bed Mobility Overal bed mobility: Needs Assistance Bed Mobility: Supine to Sit     Supine to sit: Min guard;HOB elevated     General bed mobility comments: verbal cues for technique  Transfers Overall transfer level: Needs assistance Equipment used: Rolling walker (2 wheeled) Transfers: Sit to/from Stand Sit to Stand: Min guard;From elevated surface         General transfer comment: verbal cues for UE and LE positioning  Ambulation/Gait Ambulation/Gait assistance: Min guard Ambulation Distance (Feet): 15 Feet Assistive device: Rolling walker (2 wheeled) Gait Pattern/deviations: Step-to pattern;Decreased stance time - left;Antalgic     General Gait Details: verbal cues for sequence, RW positioning, step length, pt only wished to ambulate in room first time  Stairs            Wheelchair Mobility    Modified Rankin (Stroke Patients Only)       Balance                                              Pertinent Vitals/Pain Pain Assessment: 0-10 Pain Score: 3  Pain Location: L knee Pain Descriptors / Indicators: Aching;Sore Pain Intervention(s): Limited activity within patient's tolerance;Repositioned;Monitored during session    Home Living Family/patient expects to be discharged to:: Private residence Living Arrangements: Spouse/significant other   Type of Home: House Home Access: Stairs to enter   Secretary/administrator of Steps: 2 Home Layout: One level Home Equipment: Environmental consultant - 2 wheels      Prior Function Level of Independence: Independent               Hand Dominance        Extremity/Trunk Assessment        Lower Extremity Assessment Lower Extremity Assessment: LLE deficits/detail LLE Deficits / Details: unable to perform SLR, ROM TBA       Communication   Communication: No difficulties  Cognition Arousal/Alertness: Awake/alert Behavior During Therapy: WFL for tasks assessed/performed Overall Cognitive Status: Within Functional Limits for tasks assessed                                        General Comments      Exercises     Assessment/Plan    PT Assessment Patient needs continued PT services  PT Problem List  Decreased range of motion;Decreased strength;Decreased mobility;Pain;Decreased knowledge of use of DME;Decreased knowledge of precautions       PT Treatment Interventions Functional mobility training;Stair training;Therapeutic exercise;Gait training;Patient/family education;DME instruction;Therapeutic activities    PT Goals (Current goals can be found in the Care Plan section)  Acute Rehab PT Goals PT Goal Formulation: With patient Time For Goal Achievement: 03/30/17 Potential to Achieve Goals: Good    Frequency 7X/week   Barriers to discharge        Co-evaluation               AM-PAC PT "6 Clicks" Daily Activity  Outcome Measure Difficulty turning over in bed (including adjusting bedclothes,  sheets and blankets)?: None Difficulty moving from lying on back to sitting on the side of the bed? : Unable Difficulty sitting down on and standing up from a chair with arms (e.g., wheelchair, bedside commode, etc,.)?: Unable Help needed moving to and from a bed to chair (including a wheelchair)?: A Little Help needed walking in hospital room?: A Little Help needed climbing 3-5 steps with a railing? : A Lot 6 Click Score: 14    End of Session Equipment Utilized During Treatment: Gait belt;Left knee immobilizer Activity Tolerance: Patient tolerated treatment well Patient left: in chair;with call bell/phone within reach;with family/visitor present Nurse Communication: Mobility status PT Visit Diagnosis: Pain;Difficulty in walking, not elsewhere classified (R26.2) Pain - Right/Left: Left Pain - part of body: Knee    Time: 1150-1207 PT Time Calculation (min) (ACUTE ONLY): 17 min   Charges:   PT Evaluation $PT Eval Low Complexity: 1 Low     PT G CodesZenovia Jarred, PT, DPT 03/23/2017 Pager: [phone]  Maida Sale E 03/23/2017, 1:00 PM

## 2017-03-24 LAB — CBC
HCT: 29.4 % — ABNORMAL LOW (ref 39.0–52.0)
Hemoglobin: 10 g/dL — ABNORMAL LOW (ref 13.0–17.0)
MCH: 31.2 pg (ref 26.0–34.0)
MCHC: 34 g/dL (ref 30.0–36.0)
MCV: 91.6 fL (ref 78.0–100.0)
PLATELETS: 148 10*3/uL — AB (ref 150–400)
RBC: 3.21 MIL/uL — ABNORMAL LOW (ref 4.22–5.81)
RDW: 13.6 % (ref 11.5–15.5)
WBC: 8.7 10*3/uL (ref 4.0–10.5)

## 2017-03-24 NOTE — Care Management Note (Addendum)
Case Management Note  Patient Details  Name: NESHAWN AIRD MRN: [id number] Date of Birth: May 24, 1956  Subjective/Objective:     S/p Left TKA               Action/Plan: Discharge Planning: Spoke to pt and wife at bedside. Offered choice for HH/list provided. Requested [facility]. Has RW and 3n1 bedside commode at home. Contacted [facility] rep with new referral.    Address [address] [place] [address] (updated agency)  PCP [name]   Expected Discharge Date:  03/24/17               Expected Discharge Plan:  Home w Home Health Services  In-House Referral:  NA  Discharge planning Services  CM Consult  Post Acute Care Choice:  Home Health Choice offered to:  Patient  DME Arranged:  N/A DME Agency:  NA  HH Arranged:  PT HH Agency:  [facility] (formerly [facility])  Status of Service:  Completed, signed off  If discussed at [employer] [facility] [employer], dates discussed:    Additional Comments:  Elliot Cousin, RN 03/24/2017, 12:10 PM

## 2017-03-24 NOTE — Progress Notes (Signed)
   Subjective: 2 Days Post-Op Procedure(s) (LRB): LEFT TOTAL KNEE ARTHROPLASTY (Left) Patient reports pain as moderate.   Plan is to go Home after hospital stay.  Objective: Vital signs in last 24 hours: Temp:  [99.1 F (37.3 C)-99.9 F (37.7 C)] 99.9 F (37.7 C) (09/23 0851) Pulse Rate:  [84-89] 86 (09/23 0851) Resp:  [18-20] 20 (09/23 0851) BP: (103-114)/(43-56) 103/46 (09/23 0851) SpO2:  [94 %-96 %] 94 % (09/23 0851)  Intake/Output from previous day:  Intake/Output Summary (Last 24 hours) at 03/24/17 0958 Last data filed at 03/24/17 0440  Gross per 24 hour  Intake              240 ml  Output             1175 ml  Net             -935 ml    Intake/Output this shift: No intake/output data recorded.  Labs:  Recent Labs  03/23/17 0357 03/24/17 0416  HGB 11.1* 10.0*    Recent Labs  03/23/17 0357 03/24/17 0416  WBC 12.9* 8.7  RBC 3.66* 3.21*  HCT 33.3* 29.4*  PLT 172 148*    Recent Labs  03/23/17 0357  NA 138  K 4.1  CL 105  CO2 25  BUN 16  CREATININE 1.03  GLUCOSE 146*  CALCIUM 8.3*   No results for input(s): LABPT, INR in the last 72 hours.  EXAM General - Patient is Alert, Appropriate and Oriented Extremity - Neurologically intact Neurovascular intact Incision: scant drainage No cellulitis present Compartment soft Dressing/Incision - clean, dry Motor Function - intact, moving foot and toes well on exam.   Past Medical History:  Diagnosis Date  . CAD (coronary artery disease)   . Dizziness   . Dyslipidemia   . Flu 06/2016   Had again 01/208 with PNA  . Obesity   . OSA (obstructive sleep apnea)   . Systemic hypertension   . Vasospastic angina (HCC)     Assessment/Plan: 2 Days Post-Op Procedure(s) (LRB): LEFT TOTAL KNEE ARTHROPLASTY (Left) Active Problems:   Osteoarthritis of right knee   S/P knee replacement   Up with therapy  Discharge home  Weight-Bearing as tolerated to left leg  Margarite Vessel V 03/24/2017, 9:58 AM

## 2017-03-24 NOTE — Progress Notes (Signed)
Physical Therapy Treatment Patient Details Name: Craig Copeland MRN: [id number] DOB: 1956/05/31 Today's Date: 03/24/2017    History of Present Illness Pt is a 61 year old male s/p L TKA with hx of CAD, hypertension, obesity, OSA    PT Comments    Pt tolerated session well today. Monitored BP due to low readings today. BPs were still little low , but closely monitored symptoms during mobility as well. Pt reported no dizziness during ambulation. Reviewed, educated and performed HEP with pt and wife and mobility as well. All cleared for DC from PT standpoint.    Follow Up Recommendations  Home health PT;DC plan and follow up therapy as arranged by surgeon     Equipment Recommendations  None recommended by PT    Recommendations for Other Services       Precautions / Restrictions Precautions Precautions: Knee Required Braces or Orthoses: Knee Immobilizer - Left Knee Immobilizer - Left: On when out of bed or walking;Discontinue once straight leg raise with < 10 degree lag Restrictions Weight Bearing Restrictions: Yes Other Position/Activity Restrictions: WBAT    Mobility  Bed Mobility   Bed Mobility: Supine to Sit;Sit to Supine     Supine to sit: Min assist Sit to supine: Min assist   General bed mobility comments: asssited  but pt was able to self assist with LLE by crossing leg over the good leg   Transfers Overall transfer level: Needs assistance Equipment used: Rolling walker (2 wheeled) Transfers: Sit to/from Stand Sit to Stand: Min guard;From elevated surface         General transfer comment: verbal cues for UE and LE positioning  Ambulation/Gait Ambulation/Gait assistance: Min guard Ambulation Distance (Feet): 75 Feet Assistive device: Rolling walker (2 wheeled) Gait Pattern/deviations: Step-to pattern;Decreased stance time - left;Antalgic     General Gait Details: pt tolerated walking today, step to, slow, but steady and no reports of  dizziness   Stairs Stairs: Yes       General stair comments: no performed pt and wife staed he has done this for years after surgeries and with current pain , so they have no concerns they wre able to tell me the safety sequence and a handout was given   Wheelchair Mobility    Modified Rankin (Stroke Patients Only)       Balance                                            Cognition Arousal/Alertness: Awake/alert Behavior During Therapy: WFL for tasks assessed/performed Overall Cognitive Status: Within Functional Limits for tasks assessed                                        Exercises Total Joint Exercises Quad Sets: AROM;10 reps;Supine;Left (all exercises educated wife and wife helped to perform and assist. and handout given ) Short Arc Quad: AAROM;Left;Supine Heel Slides: AAROM;Left;10 reps;Supine Hip ABduction/ADduction: AAROM;Supine;Left;10 reps Straight Leg Raises: AAROM;Supine;Left;10 reps Goniometric ROM: 5-45 grossly     General Comments        Pertinent Vitals/Pain Pain Assessment: 0-10 Pain Score: 3  Pain Location: L knee Pain Descriptors / Indicators: Aching;Sore Pain Intervention(s): Monitored during session    Home Living  Prior Function            PT Goals (current goals can now be found in the care plan section) Acute Rehab PT Goals Patient Stated Goal: decreased pain PT Goal Formulation: With patient Time For Goal Achievement: 03/30/17 Potential to Achieve Goals: Good Progress towards PT goals: Progressing toward goals    Frequency    7X/week      PT Plan Current plan remains appropriate    Co-evaluation              AM-PAC PT "6 Clicks" Daily Activity  Outcome Measure  Difficulty turning over in bed (including adjusting bedclothes, sheets and blankets)?: None Difficulty moving from lying on back to sitting on the side of the bed? : A Little Difficulty  sitting down on and standing up from a chair with arms (e.g., wheelchair, bedside commode, etc,.)?: Unable Help needed moving to and from a bed to chair (including a wheelchair)?: A Little Help needed walking in hospital room?: A Little Help needed climbing 3-5 steps with a railing? : A Lot 6 Click Score: 16    End of Session Equipment Utilized During Treatment: Gait belt;Left knee immobilizer Activity Tolerance: Patient tolerated treatment well Patient left: in bed;with call bell/phone within reach;with family/visitor present   PT Visit Diagnosis: Pain;Difficulty in walking, not elsewhere classified (R26.2) Pain - Right/Left: Left Pain - part of body: Knee     Time: 1005-1106 PT Time Calculation (min) (ACUTE ONLY): 61 min  Charges:  $Gait Training: 8-22 mins $Therapeutic Exercise: 23-37 mins $Therapeutic Activity: 8-22 mins                    G Codes:  Functional Assessment Tool Used: AM-PAC 6 Clicks Basic Mobility    Marella Bile, PT Pager: [phone] 03/24/2017    Walterine Amodei, Clois Dupes 03/24/2017, 11:45 AM

## 2017-03-24 NOTE — Progress Notes (Signed)
Occupational Therapy Treatment Patient Details Name: [facility] Copeland MRN: [id number] DOB: 08-11-1955 Today's Date: 03/24/2017    History of present illness Pt is a 61 year old male s/p L TKA with hx of CAD, hypertension, obesity, OSA   OT comments  Pt progressing towards acute OT goals. Focus of session was LB ADLs education and shower transfer. D/c plan remains appropriate.    Follow Up Recommendations  No OT follow up;Supervision/Assistance - 24 hour    Equipment Recommendations  None recommended by OT    Recommendations for Other Services      Precautions / Restrictions Precautions Precautions: Knee Required Braces or Orthoses: Knee Immobilizer - Left Knee Immobilizer - Left: On when out of bed or walking;Discontinue once straight leg raise with < 10 degree lag Restrictions Weight Bearing Restrictions: Yes Other Position/Activity Restrictions: WBAT       Mobility Bed Mobility Overal bed mobility: Needs Assistance Bed Mobility: Supine to Sit     Supine to sit: Min guard;HOB elevated Sit to supine: Min assist   General bed mobility comments: Pt able to self assist LLE.   Transfers Overall transfer level: Needs assistance Equipment used: Rolling walker (2 wheeled) Transfers: Sit to/from Stand Sit to Stand: Min guard;From elevated surface         General transfer comment: verbal cues for UE and LE positioning    Balance                                           ADL either performed or assessed with clinical judgement   ADL Overall ADL's : Needs assistance/impaired Eating/Feeding: Independent   Grooming: Min guard;Standing   Upper Body Bathing: Set up;Sitting   Lower Body Bathing: Minimal assistance;Sit to/from stand   Upper Body Dressing : Set up;Sitting   Lower Body Dressing: Minimal assistance;Sit to/from stand   Toilet Transfer: Min guard;Ambulation;RW   Toileting- Architect and Hygiene: Min guard;Sit to/from  stand   Tub/ Shower Transfer: Walk-in shower;Min guard;Ambulation;3 in 1;Rolling walker Tub/Shower Transfer Details (indicate cue type and reason): simulated in room Functional mobility during ADLs: Min guard;Rolling walker General ADL Comments: Pt completed bed mobility, simulated shower transfer as detailed above. Pt with difficulty accessing LLE in seated positio for LB ADLs. discussed technique with AE.      Vision       Perception     Praxis      Cognition Arousal/Alertness: Awake/alert Behavior During Therapy: WFL for tasks assessed/performed Overall Cognitive Status: Within Functional Limits for tasks assessed                                          Exercises Total Joint Exercises Quad Sets: AROM;10 reps;Supine;Left (all exercises educated wife and wife helped to perform and assist. and handout given ) Short Arc Quad: AAROM;Left;Supine Heel Slides: AAROM;Left;10 reps;Supine Hip ABduction/ADduction: AAROM;Supine;Left;10 reps Straight Leg Raises: AAROM;Supine;Left;10 reps Goniometric ROM: 5-45 grossly    Shoulder Instructions       General Comments On RA throughout session. O2 at 92-93. Did drop briefly to 87 after coming up to EOB. Pt was holding breath and with increased exertion. Educated on breathing techniques. O2 recovered quickly to low 90s. Nursing notified.    Pertinent Vitals/ Pain       Pain  Assessment: Faces Pain Score: 3  Faces Pain Scale: Hurts little more Pain Location: L knee Pain Descriptors / Indicators: Aching;Sore Pain Intervention(s): Limited activity within patient's tolerance;Monitored during session;Repositioned  Home Living                                          Prior Functioning/Environment              Frequency  Min 2X/week        Progress Toward Goals  OT Goals(current goals can now be found in the care plan section)  Progress towards OT goals: Progressing toward goals  Acute  Rehab OT Goals Patient Stated Goal: decreased pain OT Goal Formulation: With patient Time For Goal Achievement: 03/30/17 Potential to Achieve Goals: Good  Plan Discharge plan remains appropriate    Co-evaluation                 AM-PAC PT "6 Clicks" Daily Activity     Outcome Measure   Help from another person eating meals?: None Help from another person taking care of personal grooming?: A Little Help from another person toileting, which includes using toliet, bedpan, or urinal?: A Little Help from another person bathing (including washing, rinsing, drying)?: A Little Help from another person to put on and taking off regular upper body clothing?: None Help from another person to put on and taking off regular lower body clothing?: A Little 6 Click Score: 20    End of Session Equipment Utilized During Treatment: Rolling walker;Left knee immobilizer  OT Visit Diagnosis: Unsteadiness on feet (R26.81);Muscle weakness (generalized) (M62.81);Pain Pain - Right/Left: Left Pain - part of body: Knee   Activity Tolerance Patient tolerated treatment well   Patient Left in chair;with call bell/phone within reach;with nursing/sitter in room;with family/visitor present   Nurse Communication Other (comment) (see general comments)        Time: 1610-9604 OT Time Calculation (min): 20 min  Charges: OT General Charges $OT Visit: 1 Visit OT Treatments $Self Care/Home Management : 8-22 mins     Pilar Grammes 03/24/2017, 12:41 PM

## 2017-03-24 NOTE — Progress Notes (Signed)
No change in initial AM assessment, patient stable for discharge. Patient and wife did verbalized understanding of discharge instructions. Conditions stable at discharge. Saline locked d/c, cath intact. Removed telemetry.

## 2017-03-26 DIAGNOSIS — J189 Pneumonia, unspecified organism: Secondary | ICD-10-CM | POA: Diagnosis not present

## 2017-03-26 DIAGNOSIS — M545 Low back pain: Secondary | ICD-10-CM | POA: Diagnosis not present

## 2017-03-26 DIAGNOSIS — R509 Fever, unspecified: Secondary | ICD-10-CM | POA: Diagnosis not present

## 2017-03-27 DIAGNOSIS — I2510 Atherosclerotic heart disease of native coronary artery without angina pectoris: Secondary | ICD-10-CM | POA: Diagnosis not present

## 2017-03-27 DIAGNOSIS — Z471 Aftercare following joint replacement surgery: Secondary | ICD-10-CM | POA: Diagnosis not present

## 2017-03-27 DIAGNOSIS — M1711 Unilateral primary osteoarthritis, right knee: Secondary | ICD-10-CM | POA: Diagnosis not present

## 2017-03-28 ENCOUNTER — Encounter (HOSPITAL_COMMUNITY): Payer: Self-pay | Admitting: Emergency Medicine

## 2017-03-28 ENCOUNTER — Emergency Department (HOSPITAL_COMMUNITY): Payer: 59

## 2017-03-28 ENCOUNTER — Emergency Department (HOSPITAL_COMMUNITY)
Admission: EM | Admit: 2017-03-28 | Discharge: 2017-03-28 | Disposition: A | Discharge status: Home / Self Care | Payer: 59 | Attending: Emergency Medicine | Admitting: Emergency Medicine

## 2017-03-28 DIAGNOSIS — J189 Pneumonia, unspecified organism: Secondary | ICD-10-CM | POA: Diagnosis not present

## 2017-03-28 DIAGNOSIS — I2510 Atherosclerotic heart disease of native coronary artery without angina pectoris: Secondary | ICD-10-CM | POA: Diagnosis not present

## 2017-03-28 DIAGNOSIS — Z96652 Presence of left artificial knee joint: Secondary | ICD-10-CM | POA: Insufficient documentation

## 2017-03-28 DIAGNOSIS — Z955 Presence of coronary angioplasty implant and graft: Secondary | ICD-10-CM | POA: Diagnosis not present

## 2017-03-28 DIAGNOSIS — Z7902 Long term (current) use of antithrombotics/antiplatelets: Secondary | ICD-10-CM | POA: Insufficient documentation

## 2017-03-28 DIAGNOSIS — Z79899 Other long term (current) drug therapy: Secondary | ICD-10-CM | POA: Insufficient documentation

## 2017-03-28 DIAGNOSIS — R509 Fever, unspecified: Secondary | ICD-10-CM | POA: Insufficient documentation

## 2017-03-28 DIAGNOSIS — R0602 Shortness of breath: Secondary | ICD-10-CM | POA: Diagnosis not present

## 2017-03-28 DIAGNOSIS — R110 Nausea: Secondary | ICD-10-CM | POA: Diagnosis not present

## 2017-03-28 DIAGNOSIS — M25462 Effusion, left knee: Secondary | ICD-10-CM | POA: Diagnosis not present

## 2017-03-28 DIAGNOSIS — R1110 Vomiting, unspecified: Secondary | ICD-10-CM | POA: Diagnosis not present

## 2017-03-28 DIAGNOSIS — I10 Essential (primary) hypertension: Secondary | ICD-10-CM | POA: Diagnosis not present

## 2017-03-28 DIAGNOSIS — R9431 Abnormal electrocardiogram [ECG] [EKG]: Secondary | ICD-10-CM | POA: Diagnosis not present

## 2017-03-28 LAB — URINALYSIS, ROUTINE W REFLEX MICROSCOPIC
BILIRUBIN URINE: NEGATIVE
GLUCOSE, UA: NEGATIVE mg/dL
HGB URINE DIPSTICK: NEGATIVE
KETONES UR: NEGATIVE mg/dL
Leukocytes, UA: NEGATIVE
Nitrite: NEGATIVE
PROTEIN: NEGATIVE mg/dL
Specific Gravity, Urine: 1.012 (ref 1.005–1.030)
pH: 6 (ref 5.0–8.0)

## 2017-03-28 LAB — CBC
HEMATOCRIT: 31.2 % — AB (ref 39.0–52.0)
HEMOGLOBIN: 10.4 g/dL — AB (ref 13.0–17.0)
MCH: 30 pg (ref 26.0–34.0)
MCHC: 33.3 g/dL (ref 30.0–36.0)
MCV: 89.9 fL (ref 78.0–100.0)
Platelets: 238 10*3/uL (ref 150–400)
RBC: 3.47 MIL/uL — ABNORMAL LOW (ref 4.22–5.81)
RDW: 13.6 % (ref 11.5–15.5)
WBC: 8 10*3/uL (ref 4.0–10.5)

## 2017-03-28 LAB — BASIC METABOLIC PANEL
ANION GAP: 8 (ref 5–15)
BUN: 19 mg/dL (ref 6–20)
CO2: 24 mmol/L (ref 22–32)
Calcium: 8.9 mg/dL (ref 8.9–10.3)
Chloride: 105 mmol/L (ref 101–111)
Creatinine, Ser: 0.99 mg/dL (ref 0.61–1.24)
GFR calc Af Amer: >60 mL/min (ref >60)
GFR calc non Af Amer: >60 mL/min (ref >60)
GLUCOSE: 118 mg/dL — AB (ref 65–99)
POTASSIUM: 4.4 mmol/L (ref 3.5–5.1)
Sodium: 137 mmol/L (ref 135–145)

## 2017-03-28 LAB — POCT I-STAT TROPONIN I: TROPONIN I, POC: 0.01 ng/mL (ref 0.00–0.08)

## 2017-03-28 LAB — D-DIMER, QUANTITATIVE: D-Dimer, Quant: 3.04 ug/mL-FEU — ABNORMAL HIGH (ref 0.00–0.50)

## 2017-03-28 MED ORDER — ONDANSETRON HCL 4 MG/2ML IJ SOLN
4.0000 mg | Freq: Once | INTRAMUSCULAR | Status: AC
  Administered 2017-03-28: 4 mg via INTRAVENOUS
  Filled 2017-03-28: qty 2

## 2017-03-28 MED ORDER — IOPAMIDOL (ISOVUE-370) INJECTION 76%
INTRAVENOUS | Status: AC
  Filled 2017-03-28: qty 100

## 2017-03-28 MED ORDER — VANCOMYCIN HCL 10 G IV SOLR
2000.0000 mg | Freq: Once | INTRAVENOUS | Status: AC
  Administered 2017-03-28: 2000 mg via INTRAVENOUS
  Filled 2017-03-28: qty 2000

## 2017-03-28 MED ORDER — IOPAMIDOL (ISOVUE-370) INJECTION 76%
100.0000 mL | Freq: Once | INTRAVENOUS | Status: AC | PRN
  Administered 2017-03-28: 100 mL via INTRAVENOUS

## 2017-03-28 MED ORDER — ONDANSETRON 8 MG PO TBDP
8.0000 mg | ORAL_TABLET | Freq: Once | ORAL | Status: AC
  Administered 2017-03-28: 8 mg via ORAL
  Filled 2017-03-28: qty 1

## 2017-03-28 MED ORDER — ONDANSETRON HCL 4 MG PO TABS: 4.0000 mg | ORAL_TABLET | Freq: Four times a day (QID) | ORAL | 0 refills | Status: DC

## 2017-03-28 MED ORDER — SODIUM CHLORIDE 0.9 % IV BOLUS (SEPSIS)
1000.0000 mL | Freq: Once | INTRAVENOUS | Status: AC
  Administered 2017-03-28: 1000 mL via INTRAVENOUS

## 2017-03-28 NOTE — ED Provider Notes (Signed)
[facility] DEPT Provider Note   CSN: [id number] Arrival date & time: 03/28/17  1428     History   Chief Complaint Chief Complaint  Patient presents with  . Emesis  . Pneumonia    HPI Craig Copeland is a 61 y.o. male.  Status post left total knee replacement 6 days ago by Dr. Hayden Rasmussen. He was discharged on Sunday. He's been running intermittent fever since that time. He was seem by his primary care doctor on Tuesday and told he had pneumonia and prescribed Levaquin.  He then had several episodes of vomiting and went back to his primary care doctor today. He was instructed to come to the ED. He is dyspneic on exertion. Past medical history includes obesity, CAD, dyslipidemia, hypertension      Past Medical History:  Diagnosis Date  . CAD (coronary artery disease)   . Dizziness   . Dyslipidemia   . Flu 06/2016   Had again 01/208 with PNA  . Obesity   . OSA (obstructive sleep apnea)   . Systemic hypertension   . 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 Osteoarthritis of right knee 03/22/2017  . S/P knee replacement 03/22/2017  . Chest pain with high risk for cardiac etiology 08/28/2016  . Murmur, cardiac 04/01/2015  . Angina pectoris syndrome (HCC) 12/18/2012  . CAD (coronary artery disease) 12/18/2012  . Hyperlipidemia 12/18/2012  . Essential hypertension 12/18/2012  . Morbid obesity (HCC) 12/18/2012  . Obstructive sleep apnea 12/18/2012    Past Surgical History:  Procedure Laterality Date  . CARDIAC CATHETERIZATION  12/02/2002   <20% restenosis RCA  . CARDIAC CATHETERIZATION  01/30/2010   20-30% in-stent restenosis,60-70% ostial stenosis  . CORONARY ANGIOPLASTY WITH STENT PLACEMENT  11/13/2002   mid RCA  . KNEE SURGERY  2003  . LEFT HEART CATH AND CORONARY ANGIOGRAPHY N/A 09/05/2016   Procedure: Left Heart Cath and Coronary Angiography;  Surgeon: Kathleene Hazel, Craig Copeland;  Location: [facility] INVASIVE CV LAB;   Service: Cardiovascular;  Laterality: N/A;  . LEFT HEART CATHETERIZATION WITH CORONARY ANGIOGRAM N/A 12/22/2012   Procedure: LEFT HEART CATHETERIZATION WITH CORONARY ANGIOGRAM;  Surgeon: Thurmon Fair, Craig Copeland;  Location: [facility] CATH LAB;  Service: Cardiovascular;  Laterality: N/A;  . NOSE SURGERY    . TOTAL KNEE ARTHROPLASTY Left 03/22/2017   Procedure: LEFT TOTAL KNEE ARTHROPLASTY;  Surgeon: Eugenia Mcalpine, Craig Copeland;  Location: [facility] ORS;  Service: Orthopedics;  Laterality: Left;  [name] VASECTOMY         Home Medications    Prior to Admission medications   Medication Sig Start Date End Date Taking? Authorizing Provider  amLODipine (NORVASC) 10 MG tablet Take 10 mg by mouth every evening.    Yes Provider, Historical, [facility]  aspirin EC 325 MG tablet Take 1 tablet (325 mg total) by mouth 2 (two) times daily. 03/22/17 03/22/18 Yes Stilwell, Bryson L, PA-C  baclofen (LIORESAL) 10 MG tablet TAKE 1 TABLET BY MOUTH EVERY 8 HOURS AS NEEDED FOR SPASMS 03/25/17  Yes Provider, Historical, [facility]eland  cholecalciferol (VITAMIN D) 1000 units tablet Take 1,000 Units by mouth daily.   Yes Provider, Historical, [facility]  clopidogrel (PLAVIX) 75 MG tablet Take 75 mg by mouth daily.   Yes Provider, Historical, [facility]  ezetimibe (ZETIA) 10 MG tablet Take 10 mg by mouth every evening.    Yes Provider, Historical, Craig Copeland  fish oil-omega-3 fatty acids 1000 MG capsule Take 1 g by mouth daily.  Yes Provider, Historical, [facility] Copeland  isosorbide mononitrate (IMDUR) 30 MG 24 hr tablet TAKE 1 TABLET (30 MG TOTAL) BY MOUTH DAILY. 03/12/17  Yes Provider, Historical, Craig Copeland  levofloxacin (LEVAQUIN) 750 MG tablet TAKE 1 TABLET BY MOUTH EVERY DAY FOR 7 DAYS 03/26/17  Yes Provider, Historical, Craig Copeland  losartan (COZAAR) 100 MG tablet Take 0.5 tablets (50 mg total) by mouth daily. 12/22/12  Yes Croitoru, Mihai, Craig Copeland  nitroGLYCERIN (NITROSTAT) 0.4 MG SL tablet Place 1 tablet (0.4 mg total) under the tongue every 5 (five) minutes as needed for chest pain. 06/08/15  Yes Croitoru, Mihai, [facility] Copeland    oxyCODONE (ROXICODONE) 5 MG immediate release tablet Take 1 tablet (5 mg total) by mouth every 4 (four) hours as needed. Patient taking differently: Take 5 mg by mouth every 4 (four) hours as needed for moderate pain or severe pain.  03/22/17 03/22/18 Yes Stilwell, Bryson L, PA-C  pravastatin (PRAVACHOL) 80 MG tablet Take 80 mg by mouth at bedtime.    Yes Provider, Historical, [facility] Copeland  ranolazine (RANEXA) 1000 MG SR tablet Take 1 tablet (1,000 mg total) by mouth 2 (two) times daily. 03/14/17  Yes Croitoru, Mihai, Craig Copeland  acetaminophen (TYLENOL) 325 MG tablet Take 650 mg by mouth daily as needed for moderate pain or headache.    Provider, Historical, Craig Copeland  isosorbide mononitrate (IMDUR) 30 MG 24 hr tablet Take 1 tablet (30 mg total) by mouth daily. Patient taking differently: Take 30 mg by mouth every evening.  09/18/16 03/07/17  Croitoru, Rachelle Hora, Craig Copeland  methocarbamol (ROBAXIN) 500 MG tablet Take 1 tablet (500 mg total) by mouth every 8 (eight) hours as needed for muscle spasms. Patient not taking: Reported on 03/28/2017 03/22/17   Arsenio Loader L, PA-C  ondansetron (ZOFRAN) 4 MG tablet Take 1 tablet (4 mg total) by mouth every 6 (six) hours. 03/28/17   Donnetta Hutching, Craig Copeland    Family History Family History  Problem Relation Age of Onset  . Heart failure Father   . Hypertension Father   . Heart failure Maternal Grandmother   . Stroke Paternal Grandmother   . Cancer Paternal Grandfather   . Alzheimer's disease Paternal Grandfather     Social History Social History  Substance Use Topics  . Smoking status: Never Smoker  . Smokeless tobacco: Never Used  . Alcohol use 0.6 oz/week    1 Cans of beer per week     Comment: Weekly     Allergies   Patient has no known allergies.   Review of Systems Review of Systems  All other systems reviewed and are negative.    Physical Exam Updated Vital Signs BP 117/66   Pulse 73   Temp 98.9 F (37.2 C) (Oral)   Resp (!) 25   Ht  (1.854 m)   Wt 131.5 kg (290  lb)   SpO2 97%   BMI 38.26 kg/m   Physical Exam  Constitutional: He is oriented to person, place, and time.  Pale, nad  HENT:  Head: Normocephalic and atraumatic.  Eyes: Conjunctivae are normal.  Neck: Neck supple.  Cardiovascular: Normal rate and regular rhythm.   Pulmonary/Chest: Effort normal and breath sounds normal.  Abdominal: Soft. Bowel sounds are normal.  Musculoskeletal:  Left knee: Significant edema surrounding the incision. Erythema noted medially at mid incision.  Neurological: He is alert and oriented to person, place, and time.  Skin: Skin is warm and dry.  Psychiatric: He has a normal mood and affect. His behavior is normal.  Nursing note and  vitals reviewed.    ED Treatments / Results  Labs (all labs ordered are listed, but only abnormal results are displayed) Labs Reviewed  BASIC METABOLIC PANEL - Abnormal; Notable for the following:       Result Value   Glucose, Bld 118 (*)    All other components within normal limits  CBC - Abnormal; Notable for the following:    RBC 3.47 (*)    Hemoglobin 10.4 (*)    HCT 31.2 (*)    All other components within normal limits  D-DIMER, QUANTITATIVE (NOT AT [facility]) - Abnormal; Notable for the following:    D-Dimer, Quant 3.04 (*)    All other components within normal limits  CULTURE, BLOOD (ROUTINE X 2)  CULTURE, BLOOD (ROUTINE X 2)  URINALYSIS, ROUTINE W REFLEX MICROSCOPIC  I-STAT TROPONIN, ED  POCT I-STAT TROPONIN I    EKG  EKG Interpretation  Date/Time:  Thursday March 28 2017 14:35:05 EDT Ventricular Rate:  74 PR Interval:    QRS Duration: 99 QT Interval:  390 QTC Calculation: 433 R Axis:   38 Text Interpretation:  Sinus rhythm Prolonged PR interval Confirmed by Donnetta Copeland ([id number]) on 03/28/2017 7:26:43 PM       Radiology Dg Chest 2 View  Result Date: 03/28/2017 CLINICAL DATA:  Shortness of Breath EXAM: CHEST  2 VIEW COMPARISON:  03/26/2017 FINDINGS: Heart and mediastinal contours are within  normal limits. No focal opacities or effusions. No acute bony abnormality. IMPRESSION: No active cardiopulmonary disease. Electronically Signed   By: Charlett Nose M.D.   On: 03/28/2017 15:14   Ct Angio Chest Pe W And/or Wo Contrast  Result Date: 03/28/2017 CLINICAL DATA:  Total knee replacement last week as developed shortness-of-breath 4 days ago. Diagnosed with pneumonia 03/27/2017 on antibiotics. Evaluate for pulmonary embolism. EXAM: CT ANGIOGRAPHY CHEST WITH CONTRAST TECHNIQUE: Multidetector CT imaging of the chest was performed using the standard protocol during bolus administration of intravenous contrast. Multiplanar CT image reconstructions and MIPs were obtained to evaluate the vascular anatomy. CONTRAST:  100 mL Isovue 370 IV COMPARISON:  Chest x-ray 03/28/2017 FINDINGS: Cardiovascular: Heart is normal size. Mild calcified plaque over the right coronary artery. No evidence of pulmonary embolism. Mediastinum/Nodes: No evidence of mediastinal or hilar adenopathy. Remaining mediastinal structures are within normal. Lungs/Pleura: Lungs are adequately inflated without lobar consolidation or effusion. There is minimal dependent bibasilar atelectasis. Airways are normal. Upper Abdomen: Within normal. Musculoskeletal: Degenerative change of the spine. Review of the MIP images confirms the above findings. IMPRESSION: No evidence of pulmonary embolism. No acute cardiopulmonary disease. Minimal bibasilar dependent atelectasis. Minimal atherosclerotic coronary artery disease. Electronically Signed   By: Elberta Fortis M.D.   On: 03/28/2017 18:42   Dg Knee Complete 4 Views Left  Result Date: 03/28/2017 CLINICAL DATA:  Six days postop left total knee arthroplasty, presenting with edema and erythema involving the subcutaneous tissues overlying the knee. Generalized pain. EXAM: LEFT KNEE - COMPLETE 4+ VIEW COMPARISON:  None. FINDINGS: Marked prepatellar soft tissue swelling with scattered gas bubbles in the  subcutaneous tissues. Edema in the subcutaneous fat medial and lateral to the knee. Moderate to large joint effusion. Anatomic alignment of the prosthesis. No acute fractures. No evidence of osteomyelitis. IMPRESSION: 1. Marked prepatellar soft tissue swelling with scattered gas bubbles in the subcutaneous tissues. The gas in the tissues may be postoperative, though infection can have this appearance. 2. Anatomic alignment of the left knee prosthesis. 3. Moderate to large joint effusion. 4. No acute osseous abnormality.  Electronically Signed   By: Hulan Saas M.D.   On: 03/28/2017 17:54    Procedures Procedures (including critical care time)  Medications Ordered in ED Medications  sodium chloride 0.9 % bolus 1,000 mL (1,000 mLs Intravenous New Bag/Given 03/28/17 1939)  ondansetron (ZOFRAN) injection 4 mg (4 mg Intravenous Given 03/28/17 1642)  sodium chloride 0.9 % bolus 1,000 mL (0 mLs Intravenous Stopped 03/28/17 1915)  vancomycin (VANCOCIN) 2,000 mg in sodium chloride 0.9 % 500 mL IVPB (0 mg Intravenous Stopped 03/28/17 1940)  iopamidol (ISOVUE-370) 76 % injection 100 mL (100 mLs Intravenous Contrast Given 03/28/17 1816)     Initial Impression / Assessment and Plan / ED Course  I have reviewed the triage vital signs and the nursing notes.  Pertinent labs & imaging results that were available during my care of the patient were reviewed by me and considered in my medical decision making (see chart for details).     Status post left total knee replacement 6 days ago. Patient has had episodes of fever and vomiting. Left knee is slightly erythematous but no pus was expressed from the wound.  White count normal. CT angiogram of chest shows no pulmonary embolism. I discussed the clinical scenario with the orthopedic surgeon on-call. They will follow-up with him in the morning for an office visit. Discharge medications Zofran 4 mg. Patient is stable at discharge.  Final Clinical Impressions(s) /  ED Diagnoses   Final diagnoses:  Fever, unspecified fever cause  Intractable vomiting, presence of nausea not specified, unspecified vomiting type    New Prescriptions New Prescriptions   ONDANSETRON (ZOFRAN) 4 MG TABLET    Take 1 tablet (4 mg total) by mouth every 6 (six) hours.     Donnetta Hutching, [facility] Copeland 03/28/17 2153

## 2017-03-28 NOTE — Progress Notes (Signed)
A consult was received from an ED physician for Vancomycin per pharmacy dosing.  The patient's profile has been reviewed for ht/wt/allergies/indication/available labs.   A one time order has been placed for Vancomycin 2gm IV.  Further antibiotics/pharmacy consults should be ordered by admitting physician if indicated.                       Thank you, Junita Push, PharmD, BCPS 03/28/2017@4 :19 PM

## 2017-03-28 NOTE — ED Notes (Signed)
Bed: WA13 Expected date:  Expected time:  Means of arrival:  Comments: Triage 1 

## 2017-03-28 NOTE — Discharge Instructions (Signed)
Tests show no life-threatening condition. I discussed your care with the orthopedic surgeon on-call. They will call you in the morning for a follow-up appointment tomorrow. Increase fluids. Elevate leg. Prescription for nausea medication. Tylenol for fever.

## 2017-03-28 NOTE — ED Triage Notes (Signed)
Patient BIB family,reports patient had knee replacement last Friday. Patient seen at PCP Tuesday due to fever. Dx with pneumonia. Reports taking abx as prescribed. Seen again by PCP this morning for follow up of pneumonia and fever. Patient began vomiting last night. Per wife, patient got IM phenergan at PCP. Hx MI.

## 2017-04-01 DIAGNOSIS — Z471 Aftercare following joint replacement surgery: Secondary | ICD-10-CM | POA: Diagnosis not present

## 2017-04-01 DIAGNOSIS — I2510 Atherosclerotic heart disease of native coronary artery without angina pectoris: Secondary | ICD-10-CM | POA: Diagnosis not present

## 2017-04-01 DIAGNOSIS — M1711 Unilateral primary osteoarthritis, right knee: Secondary | ICD-10-CM | POA: Diagnosis not present

## 2017-04-02 DIAGNOSIS — Z471 Aftercare following joint replacement surgery: Secondary | ICD-10-CM | POA: Diagnosis not present

## 2017-04-02 DIAGNOSIS — M1711 Unilateral primary osteoarthritis, right knee: Secondary | ICD-10-CM | POA: Diagnosis not present

## 2017-04-02 DIAGNOSIS — I2510 Atherosclerotic heart disease of native coronary artery without angina pectoris: Secondary | ICD-10-CM | POA: Diagnosis not present

## 2017-04-02 LAB — CULTURE, BLOOD (ROUTINE X 2)
CULTURE: NO GROWTH
Culture: NO GROWTH
Special Requests: ADEQUATE
Special Requests: ADEQUATE

## 2017-04-03 NOTE — Discharge Summary (Signed)
Physician Discharge Summary  Patient ID: Craig Copeland MRN: [id number] DOB/AGE: 1955-08-24 61 y.o.  Admit date: 03/22/2017 Discharge date: 03/24/17 Admission Diagnoses:  Discharge Diagnoses:  Active Problems:   Osteoarthritis of right knee   S/P knee replacement   Discharged Condition: good  Hospital Course:  Craig Copeland is a 61 y.o. who was admitted to [facility]. They were brought to the operating room on 03/22/2017 and underwent Procedure(s): LEFT TOTAL KNEE ARTHROPLASTY.  Patient tolerated the procedure well and was later transferred to the recovery room and then to the orthopaedic floor for postoperative care.  They were given PO and IV analgesics for pain control following their surgery.  They were given 24 hours of postoperative antibiotics of  Anti-infectives    Start     Dose/Rate Route Frequency Ordered Stop   03/22/17 1600  ceFAZolin (ANCEF) IVPB 2g/100 mL premix     2 g 200 mL/hr over 30 Minutes Intravenous Every 6 hours 03/22/17 1449 03/22/17 2317   03/22/17 0600  ceFAZolin (ANCEF) 3 g in dextrose 5 % 50 mL IVPB     3 g 130 mL/hr over 30 Minutes Intravenous On call to O.R. 03/21/17 1407 03/22/17 1052     and started on DVT prophylaxis.   PT and OT were ordered for total joint protocol.  Discharge planning consulted to help with postop disposition and equipment needs.  Patient had a good night on the evening of surgery and started to get up OOB with therapy on day one.  Hemovac drain was pulled without difficulty.  Continued to work with therapy into day two.  Dressing was with normal limits.  The patient had progressed with therapy and meeting their goals. Patient was seen in rounds and was ready to go home.  Consults: n/a  Significant Diagnostic Studies: routine  Treatments: routine  Discharge Exam: Blood pressure 122/62, pulse 86, temperature 99.9 F (37.7 C), temperature source Oral, resp. rate 20, height 6\' 1"  (1.854 m), weight (!) 137 kg (302  lb), SpO2 94 %. Alert and oriented x3. RRR, Lungs clear, BS x4. Left Calf soft and non tender. L knee dressing C/D/I. No DVT signs. No signs of infection or compartment syndrome. LLE grossly neurovascularly intact.   Disposition: 01-Home or Self Care  Discharge Instructions    Call MD / Call 911    Complete by:  As directed    If you experience chest pain or shortness of breath, CALL 911 and be transported to the hospital emergency room.  If you develope a fever above 101 F, pus (white drainage) or increased drainage or redness at the wound, or calf pain, call your surgeon's office.   Constipation Prevention    Complete by:  As directed    Drink plenty of fluids.  Prune juice may be helpful.  You may use a stool softener, such as Colace (over the counter) 100 mg twice a day.  Use MiraLax (over the counter) for constipation as needed.   Diet - low sodium heart healthy    Complete by:  As directed    Discharge instructions    Complete by:  As directed    INSTRUCTIONS AFTER JOINT REPLACEMENT   Remove items at home which could result in a fall. This includes throw rugs or furniture in walking pathways ICE to the affected joint every three hours while awake for 30 minutes at a time, for at least the first 3-5 days, and then as needed for pain and swelling.  Continue to use ice for pain and swelling. You may notice swelling that will progress down to the foot and ankle.  This is normal after surgery.  Elevate your leg when you are not up walking on it.   Continue to use the breathing machine you got in the hospital (incentive spirometer) which will help keep your temperature down.  It is common for your temperature to cycle up and down following surgery, especially at night when you are not up moving around and exerting yourself.  The breathing machine keeps your lungs expanded and your temperature down.   DIET:  As you were doing prior to hospitalization, we recommend a well-balanced  diet.  DRESSING / WOUND CARE / SHOWERING  Keep the surgical dressing until follow up.  The dressing is water proof, so you can shower without any extra covering.  IF THE DRESSING FALLS OFF or the wound gets wet inside, change the dressing with sterile gauze.  Please use good hand washing techniques before changing the dressing.  Do not use any lotions or creams on the incision until instructed by your surgeon.    ACTIVITY  Increase activity slowly as tolerated, but follow the weight bearing instructions below.   No driving for 6 weeks or until further direction given by your physician.  You cannot drive while taking narcotics.  No lifting or carrying greater than 10 lbs. until further directed by your surgeon. Avoid periods of inactivity such as sitting longer than an hour when not asleep. This helps prevent blood clots.  You may return to work once you are authorized by your doctor.     WEIGHT BEARING   Weight bearing as tolerated with assist device (walker, cane, etc) as directed, use it as long as suggested by your surgeon or therapist, typically at least 4-6 weeks.   EXERCISES  Results after joint replacement surgery are often greatly improved when you follow the exercise, range of motion and muscle strengthening exercises prescribed by your doctor. Safety measures are also important to protect the joint from further injury. Any time any of these exercises cause you to have increased pain or swelling, decrease what you are doing until you are comfortable again and then slowly increase them. If you have problems or questions, call your caregiver or physical therapist for advice.   Rehabilitation is important following a joint replacement. After just a few days of immobilization, the muscles of the leg can become weakened and shrink (atrophy).  These exercises are designed to build up the tone and strength of the thigh and leg muscles and to improve motion. Often times heat used for twenty  to thirty minutes before working out will loosen up your tissues and help with improving the range of motion but do not use heat for the first two weeks following surgery (sometimes heat can increase post-operative swelling).   These exercises can be done on a training (exercise) mat, on the floor, on a table or on a bed. Use whatever works the best and is most comfortable for you.    Use music or television while you are exercising so that the exercises are a pleasant break in your day. This will make your life better with the exercises acting as a break in your routine that you can look forward to.   Perform all exercises about fifteen times, three times per day or as directed.  You should exercise both the operative leg and the other leg as well.   Exercises include:  Quad Sets - Tighten up the muscle on the front of the thigh (Quad) and hold for 5-10 seconds.   Straight Leg Raises - With your knee straight (if you were given a brace, keep it on), lift the leg to 60 degrees, hold for 3 seconds, and slowly lower the leg.  Perform this exercise against resistance later as your leg gets stronger.  Leg Slides: Lying on your back, slowly slide your foot toward your buttocks, bending your knee up off the floor (only go as far as is comfortable). Then slowly slide your foot back down until your leg is flat on the floor again.  Angel Wings: Lying on your back spread your legs to the side as far apart as you can without causing discomfort.  Hamstring Strength:  Lying on your back, push your heel against the floor with your leg straight by tightening up the muscles of your buttocks.  Repeat, but this time bend your knee to a comfortable angle, and push your heel against the floor.  You may put a pillow under the heel to make it more comfortable if necessary.   A rehabilitation program following joint replacement surgery can speed recovery and prevent re-injury in the future due to weakened muscles. Contact  your doctor or a physical therapist for more information on knee rehabilitation.    CONSTIPATION  Constipation is defined medically as fewer than three stools per week and severe constipation as less than one stool per week.  Even if you have a regular bowel pattern at home, your normal regimen is likely to be disrupted due to multiple reasons following surgery.  Combination of anesthesia, postoperative narcotics, change in appetite and fluid intake all can affect your bowels.   YOU MUST use at least one of the following options; they are listed in order of increasing strength to get the job done.  They are all available over the counter, and you may need to use some, POSSIBLY even all of these options:    Drink plenty of fluids (prune juice may be helpful) and high fiber foods Colace 100 mg by mouth twice a day  Senokot for constipation as directed and as needed Dulcolax (bisacodyl), take with full glass of water  Miralax (polyethylene glycol) once or twice a day as needed.  If you have tried all these things and are unable to have a bowel movement in the first 3-4 days after surgery call either your surgeon or your primary doctor.    If you experience loose stools or diarrhea, hold the medications until you stool forms back up.  If your symptoms do not get better within 1 week or if they get worse, check with your doctor.  If you experience "the worst abdominal pain ever" or develop nausea or vomiting, please contact the office immediately for further recommendations for treatment.   ITCHING:  If you experience itching with your medications, try taking only a single pain pill, or even half a pain pill at a time.  You can also use Benadryl over the counter for itching or also to help with sleep.   TED HOSE STOCKINGS:  Use stockings on both legs until for at least 2 weeks or as directed by physician office. They may be removed at night for sleeping.  MEDICATIONS:  See your medication summary  on the "After Visit Summary" that nursing will review with you.  You may have some home medications which will be placed on hold until you complete the course  of blood thinner medication.  It is important for you to complete the blood thinner medication as prescribed.  PRECAUTIONS:  If you experience chest pain or shortness of breath - call 911 immediately for transfer to the hospital emergency department.   If you develop a fever greater that 101 F, purulent drainage from wound, increased redness or drainage from wound, foul odor from the wound/dressing, or calf pain - CONTACT YOUR SURGEON.                                                   FOLLOW-UP APPOINTMENTS:  If you do not already have a post-op appointment, please call the office for an appointment to be seen by your surgeon.  Guidelines for how soon to be seen are listed in your "After Visit Summary", but are typically between 1-4 weeks after surgery.  OTHER INSTRUCTIONS:   Knee Replacement:  Do not place pillow under knee, focus on keeping the knee straight while resting. CPM instructions: 0-90 degrees, 2 hours in the morning, 2 hours in the afternoon, and 2 hours in the evening. Place foam block, curve side up under heel at all times except when in CPM or when walking.  DO NOT modify, tear, cut, or change the foam block in any way.  MAKE SURE YOU:  Understand these instructions.  Get help right away if you are not doing well or get worse.    Thank you for letting us be a part of your medical care team.  It is a privilege we respect greatly.  We hope these instructions will help you stay on track for a fast and full recovery!   Increase activity slowly as tolerated    Complete by:  As directed      Allergies as of 03/24/2017   No Known Allergies     Medication List    STOP taking these medications   aspirin 81 MG tablet Replaced by:  aspirin EC 325 MG tablet     TAKE these medications   acetaminophen 325 MG tablet Commonly  known as:  TYLENOL Take 650 mg by mouth daily as needed for moderate pain or headache.   amLODipine 10 MG tablet Commonly known as:  NORVASC Take 10 mg by mouth every evening.   aspirin EC 325 MG tablet Take 1 tablet (325 mg total) by mouth 2 (two) times daily. Replaces:  aspirin 81 MG tablet   cholecalciferol 1000 units tablet Commonly known as:  VITAMIN D Take 1,000 Units by mouth daily.   clopidogrel 75 MG tablet Commonly known as:  PLAVIX Take 75 mg by mouth daily.   ezetimibe 10 MG tablet Commonly known as:  ZETIA Take 10 mg by mouth every evening.   fish oil-omega-3 fatty acids 1000 MG capsule Take 1 g by mouth daily.   isosorbide mononitrate 30 MG 24 hr tablet Commonly known as:  IMDUR Take 1 tablet (30 mg total) by mouth daily. What changed:  when to take this   losartan 100 MG tablet Commonly known as:  COZAAR Take 0.5 tablets (50 mg total) by mouth daily.   methocarbamol 500 MG tablet Commonly known as:  ROBAXIN Take 1 tablet (500 mg total) by mouth every 8 (eight) hours as needed for muscle spasms.   nitroGLYCERIN 0.4 MG SL tablet Commonly known as:  NITROSTAT Place 1  tablet (0.4 mg total) under the tongue every 5 (five) minutes as needed for chest pain.   oxyCODONE 5 MG immediate release tablet Commonly known as:  ROXICODONE Take 1 tablet (5 mg total) by mouth every 4 (four) hours as needed.   pravastatin 80 MG tablet Commonly known as:  PRAVACHOL Take 80 mg by mouth at bedtime.   ranolazine 1000 MG SR tablet Commonly known as:  RANEXA Take 1 tablet (1,000 mg total) by mouth 2 (two) times daily.      Follow-up Information    Eugenia Mcalpine, MD. Schedule an appointment as soon as possible for a visit in 2 week(s).   Specialty:  Orthopedic Surgery Why:  Call [phone] tomorrow to make the appointment Contact information: [address] [place] [address] [phone]        [facility] Follow up.   Specialty:   Home Health Services Why:  Home Health Physical Therapy- agency will call to arrange initial appointment Contact information: [address] [place] [address] [phone]           Signed: Markham Jordan 04/03/2017, 12:27 PM

## 2017-04-05 DIAGNOSIS — Z471 Aftercare following joint replacement surgery: Secondary | ICD-10-CM | POA: Diagnosis not present

## 2017-04-05 DIAGNOSIS — M1711 Unilateral primary osteoarthritis, right knee: Secondary | ICD-10-CM | POA: Diagnosis not present

## 2017-04-05 DIAGNOSIS — I2510 Atherosclerotic heart disease of native coronary artery without angina pectoris: Secondary | ICD-10-CM | POA: Diagnosis not present

## 2017-04-08 DIAGNOSIS — Z471 Aftercare following joint replacement surgery: Secondary | ICD-10-CM | POA: Diagnosis not present

## 2017-04-08 DIAGNOSIS — Z96652 Presence of left artificial knee joint: Secondary | ICD-10-CM | POA: Diagnosis not present

## 2017-04-09 DIAGNOSIS — M5136 Other intervertebral disc degeneration, lumbar region: Secondary | ICD-10-CM | POA: Diagnosis not present

## 2017-04-09 DIAGNOSIS — M1712 Unilateral primary osteoarthritis, left knee: Secondary | ICD-10-CM | POA: Diagnosis not present

## 2017-04-10 DIAGNOSIS — M1712 Unilateral primary osteoarthritis, left knee: Secondary | ICD-10-CM | POA: Diagnosis not present

## 2017-04-12 DIAGNOSIS — M1712 Unilateral primary osteoarthritis, left knee: Secondary | ICD-10-CM | POA: Diagnosis not present

## 2017-04-17 DIAGNOSIS — M1712 Unilateral primary osteoarthritis, left knee: Secondary | ICD-10-CM | POA: Diagnosis not present

## 2017-04-18 ENCOUNTER — Other Ambulatory Visit (HOSPITAL_COMMUNITY): Payer: Self-pay | Admitting: Specialist

## 2017-04-18 ENCOUNTER — Ambulatory Visit (HOSPITAL_COMMUNITY)
Admission: RE | Admit: 2017-04-18 | Discharge: 2017-04-18 | Disposition: A | Discharge status: Home / Self Care | Payer: 59 | Source: Ambulatory Visit | Attending: Cardiology | Admitting: Cardiology

## 2017-04-18 DIAGNOSIS — Z471 Aftercare following joint replacement surgery: Secondary | ICD-10-CM | POA: Insufficient documentation

## 2017-04-18 DIAGNOSIS — M7989 Other specified soft tissue disorders: Secondary | ICD-10-CM

## 2017-04-18 DIAGNOSIS — Z96652 Presence of left artificial knee joint: Secondary | ICD-10-CM | POA: Insufficient documentation

## 2017-04-18 DIAGNOSIS — M79605 Pain in left leg: Secondary | ICD-10-CM

## 2017-04-23 DIAGNOSIS — M1712 Unilateral primary osteoarthritis, left knee: Secondary | ICD-10-CM | POA: Diagnosis not present

## 2017-04-25 DIAGNOSIS — M1712 Unilateral primary osteoarthritis, left knee: Secondary | ICD-10-CM | POA: Diagnosis not present

## 2017-04-30 DIAGNOSIS — D649 Anemia, unspecified: Secondary | ICD-10-CM | POA: Diagnosis not present

## 2017-04-30 DIAGNOSIS — J189 Pneumonia, unspecified organism: Secondary | ICD-10-CM | POA: Diagnosis not present

## 2017-04-30 DIAGNOSIS — Z23 Encounter for immunization: Secondary | ICD-10-CM | POA: Diagnosis not present

## 2017-04-30 DIAGNOSIS — M1712 Unilateral primary osteoarthritis, left knee: Secondary | ICD-10-CM | POA: Diagnosis not present

## 2017-04-30 DIAGNOSIS — R509 Fever, unspecified: Secondary | ICD-10-CM | POA: Diagnosis not present

## 2017-05-02 DIAGNOSIS — M1712 Unilateral primary osteoarthritis, left knee: Secondary | ICD-10-CM | POA: Diagnosis not present

## 2017-05-07 DIAGNOSIS — M1712 Unilateral primary osteoarthritis, left knee: Secondary | ICD-10-CM | POA: Diagnosis not present

## 2017-05-09 DIAGNOSIS — M1712 Unilateral primary osteoarthritis, left knee: Secondary | ICD-10-CM | POA: Diagnosis not present

## 2017-05-14 DIAGNOSIS — M1712 Unilateral primary osteoarthritis, left knee: Secondary | ICD-10-CM | POA: Diagnosis not present

## 2017-05-16 DIAGNOSIS — M1712 Unilateral primary osteoarthritis, left knee: Secondary | ICD-10-CM | POA: Diagnosis not present

## 2017-05-20 DIAGNOSIS — M1712 Unilateral primary osteoarthritis, left knee: Secondary | ICD-10-CM | POA: Diagnosis not present

## 2017-05-22 DIAGNOSIS — M1712 Unilateral primary osteoarthritis, left knee: Secondary | ICD-10-CM | POA: Diagnosis not present

## 2017-05-27 ENCOUNTER — Telehealth: Payer: Self-pay | Admitting: *Deleted

## 2017-05-27 NOTE — Telephone Encounter (Signed)
   [facility] Pre-operative Risk Assessment    Request for surgical clearance:  1. What type of surgery is being performed? EXTRACTION OF TEETH # 15,16   2. When is this surgery scheduled? PENDING   3. Are there any medications that need to be held prior to surgery and how long?ASPIRIN AND PLAVIX   4. Practice name and name of physician performing surgery? ? [facility]   5. What is your office phone and fax number? PH=[phone]  FAX=[phone]   6. Anesthesia type (None, local, MAC, general) ? LOCAL   Fredia Beets 05/27/2017, 5:08 PM  _________________________________________________________________   (provider comments below)

## 2017-05-28 DIAGNOSIS — M1712 Unilateral primary osteoarthritis, left knee: Secondary | ICD-10-CM | POA: Diagnosis not present

## 2017-05-28 NOTE — Telephone Encounter (Signed)
Will route chart to Dr. Royann Shiversroitoru for recs regarding antiplatelet management prior to tooth extraction.   Dr. Royann Shiversroitoru, are you ok with pt holding ASA and Plavix for procedure?? 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

## 2017-05-29 NOTE — Telephone Encounter (Signed)
Yes, OK to temporarily hold asa and plavix. MCr

## 2017-05-29 NOTE — Telephone Encounter (Signed)
See Dr. Erin Hearingroitoru's recomendation yesterday, hold aspirin and plavix for 7 days prior to the procedure and restart as soon as possible afterward.

## 2017-05-31 NOTE — Telephone Encounter (Signed)
    Chart reviewed as part of pre-operative protocol coverage.   [facility] and Dr. Royann Shiversroitoru, "hold aspirin and plavix for 7 days prior to the procedure and restart as soon as possible afterward."  Will route this bundled recommendation to requesting provider via Epic fax function. Please call with questions.   Laurann Montanaayna N Dunn, PA-C  05/31/2017, 8:03 AM

## 2017-06-03 ENCOUNTER — Telehealth: Payer: Self-pay | Admitting: Cardiology

## 2017-06-03 ENCOUNTER — Telehealth: Payer: Self-pay | Admitting: Cardiovascular Disease

## 2017-06-03 NOTE — Telephone Encounter (Signed)
   Chart reviewed as part of pre-operative protocol coverage.   [facility] and Dr. Royann Shiversroitoru, "hold aspirin and plavix for 7 days prior to the procedure and restart as soon as possible afterward."  Will route this bundled recommendation to requesting provider via Epic fax function. Please call with questions.   Fax # [phone]  Manson PasseyBhavinkumar Celena Lanius, PAC

## 2017-06-03 NOTE — Telephone Encounter (Signed)
Please fax clearance to [phone].They did not received a fax for this.

## 2017-06-03 NOTE — Telephone Encounter (Signed)
Wrong provider

## 2017-06-04 DIAGNOSIS — M1712 Unilateral primary osteoarthritis, left knee: Secondary | ICD-10-CM | POA: Diagnosis not present

## 2017-06-06 DIAGNOSIS — M1712 Unilateral primary osteoarthritis, left knee: Secondary | ICD-10-CM | POA: Diagnosis not present

## 2017-06-11 DIAGNOSIS — M1712 Unilateral primary osteoarthritis, left knee: Secondary | ICD-10-CM | POA: Diagnosis not present

## 2017-06-13 DIAGNOSIS — M1712 Unilateral primary osteoarthritis, left knee: Secondary | ICD-10-CM | POA: Diagnosis not present

## 2017-06-17 DIAGNOSIS — M1712 Unilateral primary osteoarthritis, left knee: Secondary | ICD-10-CM | POA: Diagnosis not present

## 2017-06-20 DIAGNOSIS — M1712 Unilateral primary osteoarthritis, left knee: Secondary | ICD-10-CM | POA: Diagnosis not present

## 2017-07-01 DIAGNOSIS — M1712 Unilateral primary osteoarthritis, left knee: Secondary | ICD-10-CM | POA: Diagnosis not present

## 2017-07-04 DIAGNOSIS — M1712 Unilateral primary osteoarthritis, left knee: Secondary | ICD-10-CM | POA: Diagnosis not present

## 2017-07-11 DIAGNOSIS — M1712 Unilateral primary osteoarthritis, left knee: Secondary | ICD-10-CM | POA: Diagnosis not present

## 2017-07-16 ENCOUNTER — Telehealth: Payer: Self-pay | Admitting: Cardiovascular Disease

## 2017-07-16 ENCOUNTER — Other Ambulatory Visit: Payer: Self-pay

## 2017-07-16 MED ORDER — RANOLAZINE ER 1000 MG PO TB12: 1000.0000 mg | ORAL_TABLET | Freq: Two times a day (BID) | ORAL | 0 refills | Status: DC

## 2017-07-16 NOTE — Telephone Encounter (Signed)
Can we try to get him on the patient assistance program? There is no other equivalent drug. If we can't get him the medication, can try increasing the isosorbide to 60 mg daily. 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 Will send this message to our clinical pharmacists to see if he is eligible for the patient assistance.

## 2017-07-16 NOTE — Telephone Encounter (Signed)
Craig Copeland is calling because Craig Copeland has been taking Renexa and now the insurance wants $1500.00 and want to know if there is something else that he can take . He has one more left to take  For tonight . Please call   Thanks

## 2017-07-16 NOTE — Telephone Encounter (Signed)
SPOKE TO WIFE ,inforem her will defer to dr C. WHAT IS THE NEXT OPTION ? THE  MEDICATION IS THE ONLY THING IN IT'S CLASS.  WIFE STATES PATIENT CAN NOT AFFORD THAT AMOUNT.   SAMPLES OF 2 WEEKS WORTH AVAILABLE FOR PICK UP UNTIL  DR C DECIDES ON SOMETHING.

## 2017-07-17 NOTE — Telephone Encounter (Signed)
Talked to patient's wife today. Patient assistance form provided with sampled. Patient will complete form and return to office ASAP.

## 2017-07-18 DIAGNOSIS — M1712 Unilateral primary osteoarthritis, left knee: Secondary | ICD-10-CM | POA: Diagnosis not present

## 2017-08-28 ENCOUNTER — Other Ambulatory Visit: Payer: Self-pay | Admitting: Specialist

## 2017-08-28 ENCOUNTER — Ambulatory Visit
Admission: RE | Admit: 2017-08-28 | Discharge: 2017-08-28 | Disposition: A | Discharge status: Home / Self Care | Payer: 59 | Source: Ambulatory Visit | Attending: Specialist | Admitting: Specialist

## 2017-08-28 DIAGNOSIS — Z96652 Presence of left artificial knee joint: Secondary | ICD-10-CM

## 2017-08-28 DIAGNOSIS — M79662 Pain in left lower leg: Secondary | ICD-10-CM

## 2017-08-28 DIAGNOSIS — R600 Localized edema: Secondary | ICD-10-CM | POA: Diagnosis not present

## 2017-09-25 DIAGNOSIS — Z96652 Presence of left artificial knee joint: Secondary | ICD-10-CM | POA: Diagnosis not present

## 2017-11-08 ENCOUNTER — Other Ambulatory Visit: Payer: Self-pay | Admitting: *Deleted

## 2017-11-08 MED ORDER — NITROGLYCERIN 0.4 MG SL SUBL: 0.4000 mg | SUBLINGUAL_TABLET | SUBLINGUAL | 0 refills | Status: DC | PRN

## 2018-02-20 DIAGNOSIS — E1165 Type 2 diabetes mellitus with hyperglycemia: Secondary | ICD-10-CM | POA: Diagnosis not present

## 2018-02-20 DIAGNOSIS — Z0000 Encounter for general adult medical examination without abnormal findings: Secondary | ICD-10-CM | POA: Diagnosis not present

## 2018-02-20 DIAGNOSIS — Z125 Encounter for screening for malignant neoplasm of prostate: Secondary | ICD-10-CM | POA: Diagnosis not present

## 2018-02-20 DIAGNOSIS — I2510 Atherosclerotic heart disease of native coronary artery without angina pectoris: Secondary | ICD-10-CM | POA: Diagnosis not present

## 2018-02-25 DIAGNOSIS — Z0000 Encounter for general adult medical examination without abnormal findings: Secondary | ICD-10-CM | POA: Diagnosis not present

## 2018-03-05 ENCOUNTER — Telehealth: Payer: Self-pay | Admitting: Cardiovascular Disease

## 2018-03-05 NOTE — Telephone Encounter (Signed)
Received records from [facility] on 03/05/18, Appt 05/08/18 @ 10:00AM. [place]

## 2018-04-15 ENCOUNTER — Other Ambulatory Visit: Payer: Self-pay | Admitting: Cardiovascular Disease

## 2018-04-15 DIAGNOSIS — N400 Enlarged prostate without lower urinary tract symptoms: Secondary | ICD-10-CM | POA: Diagnosis not present

## 2018-04-15 DIAGNOSIS — I10 Essential (primary) hypertension: Secondary | ICD-10-CM | POA: Diagnosis not present

## 2018-04-28 DIAGNOSIS — M25551 Pain in right hip: Secondary | ICD-10-CM | POA: Diagnosis not present

## 2018-04-28 DIAGNOSIS — M25552 Pain in left hip: Secondary | ICD-10-CM | POA: Diagnosis not present

## 2018-05-08 ENCOUNTER — Other Ambulatory Visit: Payer: Self-pay | Admitting: Cardiovascular Disease

## 2018-05-08 ENCOUNTER — Encounter: Payer: Self-pay | Admitting: Cardiovascular Disease

## 2018-05-08 ENCOUNTER — Ambulatory Visit (INDEPENDENT_AMBULATORY_CARE_PROVIDER_SITE_OTHER): Payer: 59 | Admitting: Cardiovascular Disease

## 2018-05-08 VITALS — BP 132/72 | HR 63 | Ht 73.0 in | Wt 308.2 lb

## 2018-05-08 DIAGNOSIS — I25118 Atherosclerotic heart disease of native coronary artery with other forms of angina pectoris: Secondary | ICD-10-CM | POA: Diagnosis not present

## 2018-05-08 DIAGNOSIS — E7800 Pure hypercholesterolemia, unspecified: Secondary | ICD-10-CM | POA: Diagnosis not present

## 2018-05-08 DIAGNOSIS — I10 Essential (primary) hypertension: Secondary | ICD-10-CM

## 2018-05-08 DIAGNOSIS — G4733 Obstructive sleep apnea (adult) (pediatric): Secondary | ICD-10-CM

## 2018-05-08 MED ORDER — ISOSORBIDE MONONITRATE ER 30 MG PO TB24: 30.0000 mg | ORAL_TABLET | Freq: Every day | ORAL | 3 refills | Status: DC

## 2018-05-08 MED ORDER — AMLODIPINE BESYLATE 5 MG PO TABS: 5.0000 mg | ORAL_TABLET | Freq: Every evening | ORAL | 3 refills | Status: DC

## 2018-05-08 NOTE — Progress Notes (Signed)
Patient ID: Craig Copeland, male   DOB: 01/28/1956, 62 y.o.   MRN: [id number]     Cardiology Office Note   Date:  05/10/2018   ID:  Craig Copeland, DOB June 11, 1956, MRN [id number]  PCP:  [name], MD  Cardiologist:   Thurmon Fair, MD   Chief Complaint  Patient presents with  . Coronary Artery Disease    History of Present Illness: Craig Copeland is a 62 y.o. male who presents for follow-up for coronary artery disease, hypertension and hyperlipidemia.  On his current antianginal regimen he seems to be doing quite well.  He only required sublingual nitroglycerin about once a week, not necessarily related to activity.  States the isosorbide when he is planning to do more intense physical activity and it works pretty well that way.  This way he can limit the problems with headaches.  Unfortunately he developed some lower extremity edema with the amlodipine.  He would like to cut back on the medication.  He is no longer taking Ranexa.  He last underwent cardiac catheterization on September 05, 2016. The previously placed stents in his right coronary artery were widely patent and the only significant stenosis was an ostial 70% lesion in a relatively small caliber ramus intermedius artery. No revascularization was performed.  He seems to be tolerating pravastatin without problems. He was not tolerant of either rosuvastatin or atorvastatin.            He has a history of previous stent to the right coronary artery(2004, drug-eluting 3.0 x 33 mm Cypher stent overlapping 3.0 x 23 mm Cypher stent in the mid to proximal right coronary artery) and a moderate to severe ostial lesion of a small to medium ramus intermedius artery (too small for percutaneous revascularization). No change on cardiac cath performed in 2018. 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            His last cardiac catheterization was in March 2018,  that showed findings identical to 2014, 2011 and 2004. He has normal left ventricle systolic function by echo most recently performed in June of 2014.  History of hypertension and hyperlipidemia. He is morbidly obese. He is compliant with CPAP for obstructive sleep apnea. He is on chronic treatment with aspirin and clopidogrel.    Past Medical History:  Diagnosis Date  . CAD (coronary artery disease)   . Dizziness   . Dyslipidemia   . Flu 06/2016   Had again 01/208 with PNA  . Obesity   . OSA (obstructive sleep apnea)   . Systemic hypertension   . Vasospastic angina [facility])     Past Surgical History:  Procedure Laterality Date  . CARDIAC CATHETERIZATION  12/02/2002   <20% restenosis RCA  . CARDIAC CATHETERIZATION  01/30/2010   20-30% in-stent restenosis,60-70% ostial stenosis  . CORONARY ANGIOPLASTY WITH STENT PLACEMENT  11/13/2002   mid RCA  . KNEE SURGERY  2003  . LEFT HEART CATH AND CORONARY ANGIOGRAPHY N/A 09/05/2016   Procedure: Left Heart Cath and Coronary Angiography;  Surgeon: Kathleene Hazel, MD;  Location: [facility] INVASIVE CV LAB;  Service: Cardiovascular;  Laterality: N/A;  . LEFT HEART CATHETERIZATION WITH CORONARY ANGIOGRAM N/A 12/22/2012   Procedure: LEFT HEART CATHETERIZATION WITH CORONARY ANGIOGRAM;  Surgeon: Thurmon Fair, MD;  [facility]: [facility] CATH LAB;  Service: Cardiovascular;  Laterality: N/A;  . NOSE SURGERY    . TOTAL KNEE ARTHROPLASTY Left 03/22/2017   Procedure:  LEFT TOTAL KNEE ARTHROPLASTY;  Surgeon: Eugenia Mcalpine, MD;  Location: [facility] ORS;  Service: Orthopedics;  Laterality: Left;  [name] VASECTOMY       Current Outpatient Medications  Medication Sig Dispense Refill  . amLODipine (NORVASC) 5 MG tablet Take 1 tablet (5 mg total) by mouth every evening. 90 tablet 3  . cholecalciferol (VITAMIN D) 1000 units tablet Take 1,000 Units by mouth daily.    . clopidogrel (PLAVIX) 75 MG tablet Take 75 mg by mouth daily.    [name] ezetimibe (ZETIA) 10 MG tablet Take 10 mg by mouth  every evening.     . fish oil-omega-3 fatty acids 1000 MG capsule Take 1 g by mouth daily.     . isosorbide mononitrate (IMDUR) 30 MG 24 hr tablet Take 1 tablet (30 mg total) by mouth daily. 90 tablet 3  . losartan (COZAAR) 100 MG tablet Take 0.5 tablets (50 mg total) by mouth daily. 90 tablet 3  . nitroGLYCERIN (NITROSTAT) 0.4 MG SL tablet Place 1 tablet (0.4 mg total) under the tongue every 5 (five) minutes as needed for chest pain. NEED OV. 90 tablet 0  . pravastatin (PRAVACHOL) 80 MG tablet Take 80 mg by mouth at bedtime.     . tamsulosin (FLOMAX) 0.4 MG CAPS capsule Take 0.4 mg by mouth daily.     No current facility-administered medications for this visit.     Allergies:   Patient has no known allergies.    Social History:  The patient  reports that he has never smoked. He has never used smokeless tobacco. He reports that he drinks about 1.0 standard drinks of alcohol per week. He reports that he does not use drugs.   Family History:  The patient's family history includes Alzheimer's disease in his paternal grandfather; Cancer in his paternal grandfather; Heart failure in his father and maternal grandmother; Hypertension in his father; Stroke in his paternal grandmother.    ROS:  Please see the history of present illness.  He denies shortness of breath at rest or with activity, orthopnea, PND, claudication, focal neurological complaints.  All other systems are reviewed and negative.    PHYSICAL EXAM: VS:  BP 132/72   Pulse 63   Ht 6\' 1"  (1.854 m)   Wt (!) 308 lb 3.2 oz (139.8 kg)   BMI 40.66 kg/m  , BMI Body mass index is 40.66 kg/m.  General: Alert, oriented x3, no distress, obese Head: no evidence of trauma, PERRL, EOMI, no exophtalmos or lid lag, no myxedema, no xanthelasma; normal ears, nose and oropharynx Neck: normal jugular venous pulsations and no hepatojugular reflux; brisk carotid pulses without delay and no carotid bruits Chest: clear to auscultation, no signs of  consolidation by percussion or palpation, normal fremitus, symmetrical and full respiratory excursions Cardiovascular: normal position and quality of the apical impulse, regular rhythm, normal first and second heart sounds, no murmurs, rubs or gallops Abdomen: no tenderness or distention, no masses by palpation, no abnormal pulsatility or arterial bruits, normal bowel sounds, no hepatosplenomegaly Extremities: no clubbing, cyanosis; 2+ symmetrical ankle edema; 2+ radial, ulnar and brachial pulses bilaterally; 2+ right femoral, posterior tibial and dorsalis pedis pulses; 2+ left femoral, posterior tibial and dorsalis pedis pulses; no subclavian or femoral bruits Neurological: grossly nonfocal Psych: Normal mood and affect   EKG:  EKG is ordered today.  It shows normal sinus rhythm old inferior wall MI, no acute repol changes  Recent Labs:  Lipid Panel     Component Value Date/Time  CHOL 137 04/08/2015 0804   TRIG 157 (H) 04/08/2015 0804   HDL 32 (L) 04/08/2015 0804   CHOLHDL 4.3 04/08/2015 0804   VLDL 31 (H) 04/08/2015 0804   LDLCALC 74 04/08/2015 0804     Wt Readings from Last 3 Encounters:  05/08/18 (!) 308 lb 3.2 oz (139.8 kg)  03/28/17 290 lb (131.5 kg)  03/22/17 (!) 302 lb (137 kg)     ASSESSMENT AND PLAN:  1. Coronary artery disease involving native coronary artery of native heart with other form of angina pectoris (HCC)   2. Hypercholesterolemia   3. Essential hypertension   4. Morbid obesity (HCC)   5. OSA (obstructive sleep apnea)      1. CAD s/p PCI RCA. In addition to fixed CAD, he is suspected of having vasospastic angina, and had a good response to long-acting nitrates on top of amlodipine. Slow heart rate precludes use of beta blockers and would probably worsen his tendency to vasospasm. Has edema and wants to try reducing the amlodipine dose.  I asked him to take the isosorbide daily in that case. 2. Hyperlipidemia. Did not tolerate more potent statins.  Followed by Dr. Renne Crigler. Option for PCSK9 inhibitor if LDL >70. 3. Hypertension can add low dose diuretic if BP rises after cutting back on amlodipine 4. Morbid obesity: weight up and down, no real progress with weight loss. 5. OSA reports that he is 100% compliant with CPAP .  Daytime hypersomnolence.  Current medicines are reviewed at length with the patient today.   Labs/ tests ordered today include:   Orders Placed This Encounter  Procedures  . EKG 12-Lead    Patient Instructions  Medication Instructions:  Dr Royann Shivers has recommended making the following medication changes: 1. DECREASE Amlodipine to 5 mg daily 2. INCREASE Isosorbide to 1 tablet daily  Your physician has requested that you regularly monitor your blood pressure at home. Please use the same machine to check your blood pressure daily. Keep a record of your blood pressures using the log sheet provided. In 3 weeks, please report your readings back to Dr C. You may use our online patient portal 'MyChart' or you can call the office to speak with a nurse.  If you need a refill on your cardiac medications before your next appointment, please call your pharmacy.   Follow-Up:  Dr Royann Shivers recommends that you schedule a follow-up appointment in 6 weeks with a NP/PA.  At [facility], you and your health needs are our priority.  As part of our continuing mission to provide you with exceptional heart care, we have created designated Provider Care Teams.  These Care Teams include your primary Cardiologist (physician) and Advanced Practice Providers (APPs -  Physician Assistants and Nurse Practitioners) who all work together to provide you with the care you need, when you need it. You will need a follow up appointment in 12 months.  Please call our office 2 months in advance to schedule this appointment.  You may see Thurmon Fair, MD or one of the following Advanced Practice Providers on your designated Care Team: [place],  [place] . Craig Flesher, PA-C     Signed, Thurmon Fair, MD  05/10/2018 12:59 PM    Thurmon Fair, MD, [facility] [phone] office [phone] pager

## 2018-05-08 NOTE — Patient Instructions (Signed)
Medication Instructions:  Dr Royann Shivers has recommended making the following medication changes: 1. DECREASE Amlodipine to 5 mg daily 2. INCREASE Isosorbide to 1 tablet daily  Your physician has requested that you regularly monitor your blood pressure at home. Please use the same machine to check your blood pressure daily. Keep a record of your blood pressures using the log sheet provided. In 3 weeks, please report your readings back to Dr C. You may use our online patient portal 'MyChart' or you can call the office to speak with a nurse.  If you need a refill on your cardiac medications before your next appointment, please call your pharmacy.   Follow-Up:  Dr Royann Shivers recommends that you schedule a follow-up appointment in 6 weeks with a NP/PA.  At [facility], you and your health needs are our priority.  As part of our continuing mission to provide you with exceptional heart care, we have created designated Provider Care Teams.  These Care Teams include your primary Cardiologist (physician) and Advanced Practice Providers (APPs -  Physician Assistants and Nurse Practitioners) who all work together to provide you with the care you need, when you need it. You will need a follow up appointment in 12 months.  Please call our office 2 months in advance to schedule this appointment.  You may see Thurmon Fair, MD or one of the following Advanced Practice Providers on your designated Care Team: [place], [place] . Micah Flesher, PA-C

## 2018-05-09 DIAGNOSIS — M7061 Trochanteric bursitis, right hip: Secondary | ICD-10-CM | POA: Diagnosis not present

## 2018-05-09 DIAGNOSIS — M7062 Trochanteric bursitis, left hip: Secondary | ICD-10-CM | POA: Diagnosis not present

## 2018-05-10 ENCOUNTER — Encounter: Payer: Self-pay | Admitting: Cardiovascular Disease

## 2018-05-13 DIAGNOSIS — M7062 Trochanteric bursitis, left hip: Secondary | ICD-10-CM | POA: Diagnosis not present

## 2018-05-13 DIAGNOSIS — M7061 Trochanteric bursitis, right hip: Secondary | ICD-10-CM | POA: Diagnosis not present

## 2018-05-20 DIAGNOSIS — M7062 Trochanteric bursitis, left hip: Secondary | ICD-10-CM | POA: Diagnosis not present

## 2018-05-20 DIAGNOSIS — M7061 Trochanteric bursitis, right hip: Secondary | ICD-10-CM | POA: Diagnosis not present

## 2018-05-27 DIAGNOSIS — M7061 Trochanteric bursitis, right hip: Secondary | ICD-10-CM | POA: Diagnosis not present

## 2018-05-27 DIAGNOSIS — M7062 Trochanteric bursitis, left hip: Secondary | ICD-10-CM | POA: Diagnosis not present

## 2018-06-05 DIAGNOSIS — M7062 Trochanteric bursitis, left hip: Secondary | ICD-10-CM | POA: Diagnosis not present

## 2018-06-05 DIAGNOSIS — M7061 Trochanteric bursitis, right hip: Secondary | ICD-10-CM | POA: Diagnosis not present

## 2018-06-09 DIAGNOSIS — M25552 Pain in left hip: Secondary | ICD-10-CM | POA: Diagnosis not present

## 2018-06-09 DIAGNOSIS — M7061 Trochanteric bursitis, right hip: Secondary | ICD-10-CM | POA: Diagnosis not present

## 2018-06-09 DIAGNOSIS — M7062 Trochanteric bursitis, left hip: Secondary | ICD-10-CM | POA: Diagnosis not present

## 2018-06-09 DIAGNOSIS — M25551 Pain in right hip: Secondary | ICD-10-CM | POA: Diagnosis not present

## 2018-06-20 ENCOUNTER — Ambulatory Visit (INDEPENDENT_AMBULATORY_CARE_PROVIDER_SITE_OTHER): Payer: 59 | Admitting: Physician Assistant

## 2018-06-20 ENCOUNTER — Encounter (INDEPENDENT_AMBULATORY_CARE_PROVIDER_SITE_OTHER): Payer: Self-pay

## 2018-06-20 ENCOUNTER — Encounter: Payer: Self-pay | Admitting: Physician Assistant

## 2018-06-20 VITALS — BP 122/60 | HR 71 | Ht 73.0 in | Wt 310.8 lb

## 2018-06-20 DIAGNOSIS — G4733 Obstructive sleep apnea (adult) (pediatric): Secondary | ICD-10-CM

## 2018-06-20 DIAGNOSIS — I2510 Atherosclerotic heart disease of native coronary artery without angina pectoris: Secondary | ICD-10-CM

## 2018-06-20 DIAGNOSIS — R600 Localized edema: Secondary | ICD-10-CM

## 2018-06-20 DIAGNOSIS — E785 Hyperlipidemia, unspecified: Secondary | ICD-10-CM | POA: Diagnosis not present

## 2018-06-20 DIAGNOSIS — Z9989 Dependence on other enabling machines and devices: Secondary | ICD-10-CM

## 2018-06-20 DIAGNOSIS — I10 Essential (primary) hypertension: Secondary | ICD-10-CM | POA: Diagnosis not present

## 2018-06-20 NOTE — Progress Notes (Signed)
Cardiology Office Note    Date:  06/22/2018   ID:  Craig Copeland, DOB 12/25/1955, MRN [id number]  PCP:  Merri BrunettePharr, [name], MD  Cardiologist:  Dr. Royann Shiversroitoru  Chief Complaint  Patient presents with  . Follow-up    seen for Dr. Royann Shiversroitoru    History of Present Illness:  Craig Copeland is a 62 y.o. male with PMH of CAD, hyperlipidemia, OSA on CPAP and hypertension.  He had a history of DES x2 to RCA in 2004. He had a cardiac catheterization in March 2018 which showed patent RCA stent and ostial 70% lesion in small ramus.  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  Echocardiogram obtained on 04/13/2015 showed EF 55 to 60%.  Patient was last seen by Dr. Germaine Pomfretorder on 05/08/2018, at which time he was doing well.  He was complaining of some lower extremity edema, his Imdur was increased to 30 mg daily and amlodipine reduced to 5 mg daily.  Patient presents today for cardiology office visit.  His lower extremity edema has significantly improved.  He denies any recent chest discomfort or shortness of breath.  His blood pressure is well controlled on the current medication.  I will continue him on the current medication regimen.   Past Medical History:  Diagnosis Date  . CAD (coronary artery disease)   . Dizziness   . Dyslipidemia   . Flu 06/2016   Had again 01/208 with PNA  . Obesity   . OSA (obstructive sleep apnea)   . Systemic hypertension   . Vasospastic angina [facility](HCC)     Past Surgical History:  Procedure Laterality Date  . CARDIAC CATHETERIZATION  12/02/2002   <20% restenosis RCA  . CARDIAC CATHETERIZATION  01/30/2010   20-30% in-stent restenosis,60-70% ostial stenosis  . CORONARY ANGIOPLASTY WITH STENT PLACEMENT  11/13/2002   mid RCA  . KNEE SURGERY  2003  . LEFT HEART CATH AND CORONARY ANGIOGRAPHY N/A 09/05/2016   Procedure: Left Heart Cath and Coronary Angiography;  Surgeon: Kathleene Hazelhristopher D McAlhany, MD;  [facility];  Service: Cardiovascular;  Laterality: N/A;  .  LEFT HEART CATHETERIZATION WITH CORONARY ANGIOGRAM N/A 12/22/2012   Procedure: LEFT HEART CATHETERIZATION WITH CORONARY ANGIOGRAM;  Surgeon: Thurmon FairMihai Croitoru, MD;  Location: [facility] CATH LAB;  Service: Cardiovascular;  Laterality: N/A;  . NOSE SURGERY    . TOTAL KNEE ARTHROPLASTY Left 03/22/2017   Procedure: LEFT TOTAL KNEE ARTHROPLASTY;  Surgeon: Eugenia Mcalpineollins, Robert, MD;  Location: [facility] ORS;  Service: Orthopedics;  Laterality: Left;  [name]. VASECTOMY      Current Medications: Outpatient Medications Prior to Visit  Medication Sig Dispense Refill  . amLODipine (NORVASC) 5 MG tablet Take 1 tablet (5 mg total) by mouth every evening. 90 tablet 3  . cholecalciferol (VITAMIN D) 1000 units tablet Take 1,000 Units by mouth daily.    . clopidogrel (PLAVIX) 75 MG tablet Take 75 mg by mouth daily.    [name]. ezetimibe (ZETIA) 10 MG tablet Take 10 mg by mouth every evening.     . fish oil-omega-3 fatty acids 1000 MG capsule Take 1 g by mouth daily.     . isosorbide mononitrate (IMDUR) 30 MG 24 hr tablet Take 1 tablet (30 mg total) by mouth daily. 90 tablet 3  . losartan (COZAAR) 100 MG tablet Take 0.5 tablets (50 mg total) by mouth daily. 90 tablet 3  . nitroGLYCERIN (NITROSTAT) 0.4 MG SL tablet Place 1 tablet (0.4 mg total) under the tongue every 5 (five) minutes as  needed for chest pain. NEED OV. 90 tablet 0  . pravastatin (PRAVACHOL) 80 MG tablet Take 80 mg by mouth at bedtime.     . tamsulosin (FLOMAX) 0.4 MG CAPS capsule Take 0.4 mg by mouth daily.     No facility-administered medications prior to visit.      Allergies:   Patient has no known allergies.   Social History   Socioeconomic History  . Marital status: Married    Spouse name: Not on file  . Number of children: 2  . Years of education: 12th  . Highest education level: Not on file  Occupational History  . Occupation: Optometrist: [employer]  . 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  Financial resource strain: Not on file  . Food insecurity:    Worry: Not on file    Inability: Not on file  . Transportation needs:    Medical: Not on file    Non-medical: Not on file  Tobacco Use  . Smoking status: Never Smoker  . Smokeless tobacco: Never Used  Substance and Sexual Activity  . Alcohol use: Yes    Alcohol/week: 1.0 standard drinks    Types: 1 Cans of beer per week    Comment: Weekly  . Drug use: No  . Sexual activity: Not on file  Lifestyle  . Physical activity:    Days per week: Not on file    Minutes per session: Not on file  . Stress: Not on file  Relationships  . Social connections:    Talks on phone: Not on file    Gets together: Not on file    Attends religious service: Not on file    Active member of club or organization: Not on file    Attends meetings of clubs or organizations: Not on file    Relationship status: Not on file  Other Topics Concern  . Not on file  Social History Narrative   Patient is married with two children.   Patient is right handed.   Patient has 12th grade education.   Patient does not drink caffeine.     Family History:  The patient's family history includes Alzheimer's disease in his paternal grandfather; Cancer in his paternal grandfather; Heart failure in his father and maternal grandmother; Hypertension in his father; Stroke in his paternal grandmother.   ROS:   Please see the history of present illness.    ROS All other systems reviewed and are negative.   PHYSICAL EXAM:   VS:  BP 122/60   Pulse 71   Ht 6\' 1"  (1.854 m)   Wt (!) 310 lb 12.8 oz (141 kg)   BMI 41.01 kg/m    GEN: Well nourished, well developed, in no acute distress  HEENT: normal  Neck: no JVD, carotid bruits, or masses Cardiac: RRR; no murmurs, rubs, or gallops,no edema  Respiratory:  clear to auscultation bilaterally, normal work of breathing GI: soft, nontender, nondistended, + BS MS: no deformity or atrophy  Skin: warm and dry, no  rash Neuro:  Alert and Oriented x 3, Strength and sensation are intact Psych: euthymic mood, full affect  Wt Readings from Last 3 Encounters:  06/20/18 (!) 310 lb 12.8 oz (141 kg)  05/08/18 (!) 308 lb 3.2 oz (139.8 kg)  03/28/17 290 lb (131.5 kg)      Studies/Labs Reviewed:   EKG:  EKG is not ordered today.    Recent Labs: No  results found for requested labs within last 8760 hours.   Lipid Panel    Component Value Date/Time   CHOL 137 04/08/2015 0804   TRIG 157 (H) 04/08/2015 0804   HDL 32 (L) 04/08/2015 0804   CHOLHDL 4.3 04/08/2015 0804   VLDL 31 (H) 04/08/2015 0804   LDLCALC 74 04/08/2015 0804    Additional studies/ records that were reviewed today include:   Echo 04/13/2015 LV EF: 55% -   60% Study Conclusions  - Left ventricle: The cavity size was normal. Wall thickness was   increased in a pattern of mild LVH. Systolic function was normal.   The estimated ejection fraction was in the range of 55% to 60%.   Wall motion was normal; there were no regional wall motion   abnormalities. Left ventricular diastolic function parameters   were normal. - Aortic valve: Mildly calcified annulus. Trileaflet. - Mitral valve: There was trivial regurgitation. - Left atrium: The atrium was at the upper limits of normal in   size. - Right atrium: Central venous pressure (est): 3 mm Hg. - Pulmonary arteries: Systolic pressure could not be accurately   estimated. - Pericardium, extracardiac: There was no pericardial effusion.  Impressions:  - Mild LVH with LVEF 55-60% and grossly normal diastolic function.   Upper normal left atrial chamber size. Aortic valve is mildly   sclerotic.   Cath 09/05/2016  Prox RCA to Dist RCA lesion, 20 %stenosed.  Ost Ramus lesion, 70 %stenosed.  Ost 1st Diag to 1st Diag lesion, 20 %stenosed.  Mid LAD to Dist LAD lesion, 20 %stenosed.  Prox LAD lesion, 20 %stenosed.  The left ventricular ejection fraction is 50-55% by visual  estimate.  The left ventricular systolic function is normal.  LV end diastolic pressure is normal.  There is no mitral valve regurgitation.   1. Single vessel CAD with patent stents proximal/mid RCA with minimal stent restenosis 2. Mild non-obstructive disease in the LAD with segment of intramyocardial bridging in the mid to distal LAD 3. Small caliber intermediate branch with moderately severe ostial stenosis, unchanged from cath in 2014.  4. Normal LV systolic function  Recommendations: Continue medical management of CAD.   ASSESSMENT:    1. Lower extremity edema   2. Coronary artery disease involving native coronary artery of native heart without angina pectoris   3. Hyperlipidemia LDL goal <70   4. Essential hypertension   5. OSA on CPAP      PLAN:  In order of problems listed above:  1. Lower extremity edema: Significantly improved after reducing the amlodipine dose.  His lungs is clear, there is no sign of heart failure  2. CAD: Continue Plavix and statin  3. Hyperlipidemia: On Zetia and Pravachol, annual lipid panel monitored by primary care provider.  4. Hypertension: Blood pressure well controlled on current therapy    Medication Adjustments/Labs and Tests Ordered: Current medicines are reviewed at length with the patient today.  Concerns regarding medicines are outlined above.  Medication changes, Labs and Tests ordered today are listed in the Patient Instructions below. Patient Instructions  Medication Instructions:  Your Physician recommend you continue on your current medication as directed.    If you need a refill on your cardiac medications before your next appointment, please call your pharmacy.   Lab work: None  Testing/Procedures: None  Follow-Up: At [facility], you and your health needs are our priority.  As part of our continuing mission to provide you with exceptional heart care, we have  created designated Provider Care Teams.  These Care  Teams include your primary Cardiologist (physician) and Advanced Practice Providers (APPs -  Physician Assistants and Nurse Practitioners) who all work together to provide you with the care you need, when you need it. You will need a follow up appointment in 11 months.  Please call our office 2 months in advance to schedule this appointment.  You may see Thurmon FairMihai Croitoru, MD or one of the following Advanced Practice Providers on your designated Care Team: [place] G[place], [place] . Micah FlesherAngela Duke, PA-C        Signed, GilsonHao Phuc Kluttz, [place]  06/22/2018 11:40 PM    [facility] [address], [place], [place]  [address] Phone: [phone](336) [phone]; Fax: [phone](336) [phone]

## 2018-06-20 NOTE — Patient Instructions (Signed)
Medication Instructions:  Your Physician recommend you continue on your current medication as directed.    If you need a refill on your cardiac medications before your next appointment, please call your pharmacy.   Lab work: None  Testing/Procedures: None  Follow-Up: At [facility], you and your health needs are our priority.  As part of our continuing mission to provide you with exceptional heart care, we have created designated Provider Care Teams.  These Care Teams include your primary Cardiologist (physician) and Advanced Practice Providers (APPs -  Physician Assistants and Nurse Practitioners) who all work together to provide you with the care you need, when you need it. You will need a follow up appointment in 11 months.  Please call our office 2 months in advance to schedule this appointment.  You may see Thurmon FairMihai Croitoru, MD or one of the following Advanced Practice Providers on your designated Care Team: [place], [place] . Micah FlesherAngela Duke, PA-C

## 2018-06-22 ENCOUNTER — Encounter: Payer: Self-pay | Admitting: Physician Assistant

## 2018-07-23 DIAGNOSIS — M25551 Pain in right hip: Secondary | ICD-10-CM | POA: Diagnosis not present

## 2018-07-23 DIAGNOSIS — M7061 Trochanteric bursitis, right hip: Secondary | ICD-10-CM | POA: Diagnosis not present

## 2018-07-23 DIAGNOSIS — M25552 Pain in left hip: Secondary | ICD-10-CM | POA: Diagnosis not present

## 2018-08-10 IMAGING — CT CT ANGIO CHEST
2 of 6 series · 18 of 36 positions shown · IV contrast (ISOVUE 370)
Comparison: Chest x-ray 03/28/2017

CLINICAL DATA: Total knee replacement last week as developed
shortness-of-breath 4 days ago. Diagnosed with pneumonia 03/27/2017
on antibiotics. Evaluate for pulmonary embolism.

EXAM:
CT ANGIOGRAPHY CHEST WITH CONTRAST
TECHNIQUE: Multidetector CT imaging of the chest was performed using the
standard protocol during bolus administration of intravenous
contrast. Multiplanar CT image reconstructions and MIPs were
obtained to evaluate the vascular anatomy.
CONTRAST:  100 mL Isovue 370 IV

[Series 5: coronal mpr · coronal · 0.55mm/px · 1 of 158 slices shown]
[im 79/158  mediastinal]
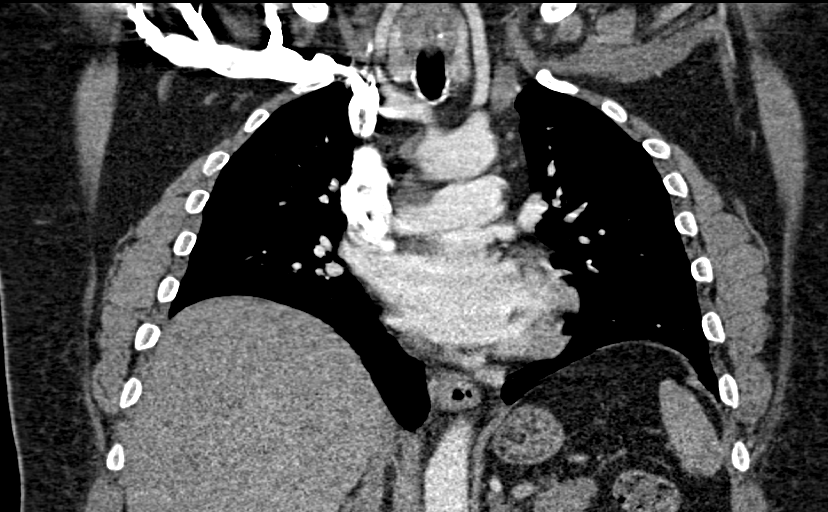

[Series 10: thins for pacs · axial · 0.79mm/px · z∈[+24,+276]mm · 17 of 280 slices shown]
[im 14/280  lung]
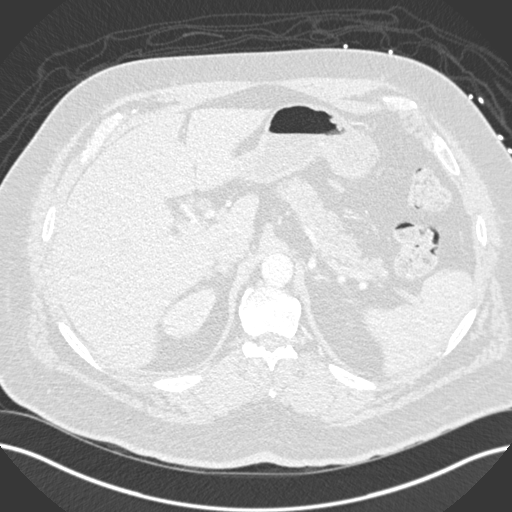
[im 28/280  mediastinal]
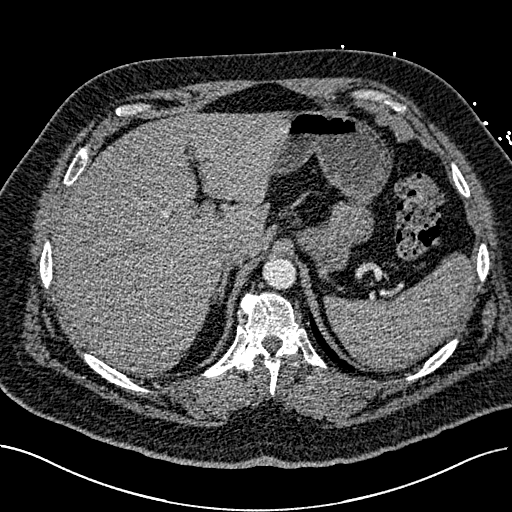
[im 42/280  lung]
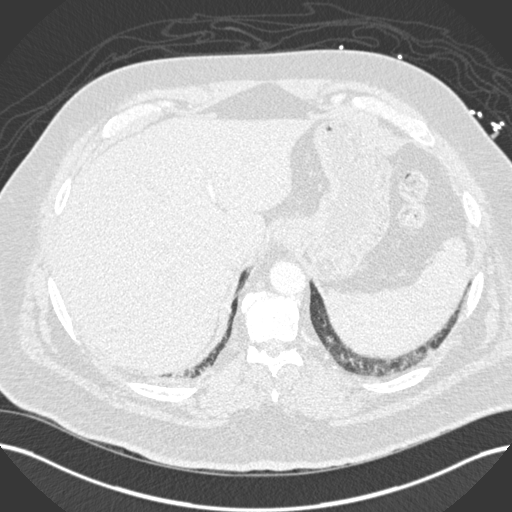
[im 56/280  mediastinal]
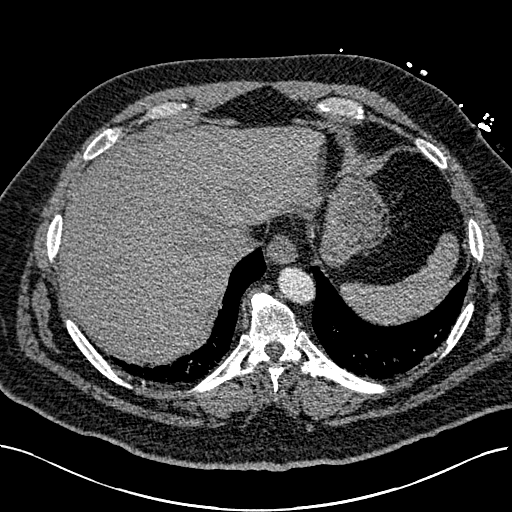
[im 84/280  lung]
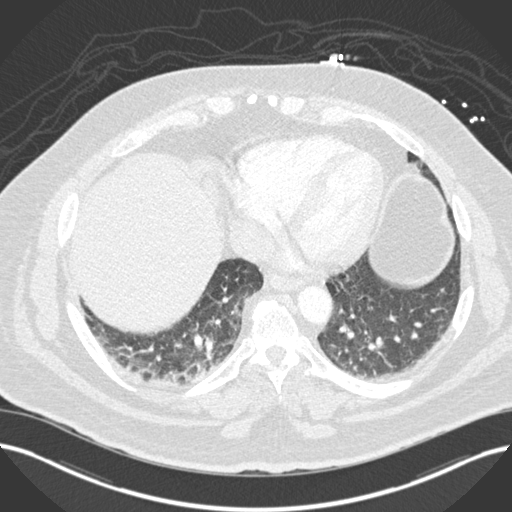
[im 98/280  mediastinal]
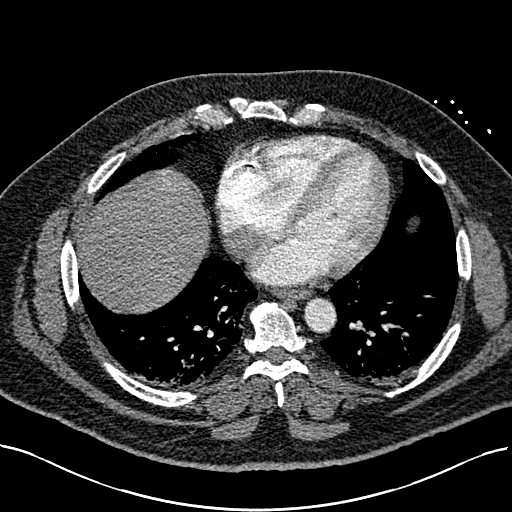
[im 112/280  lung]
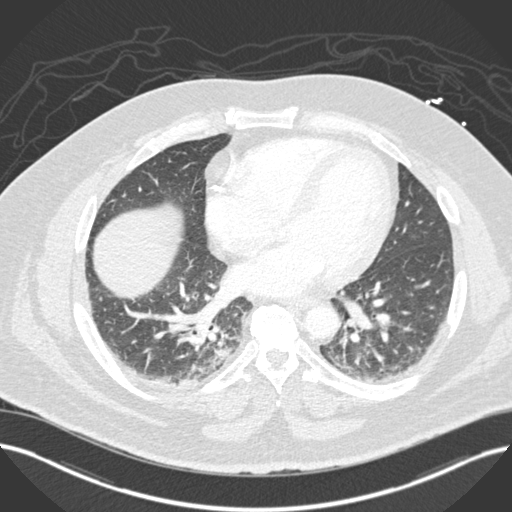
[im 126/280  mediastinal]
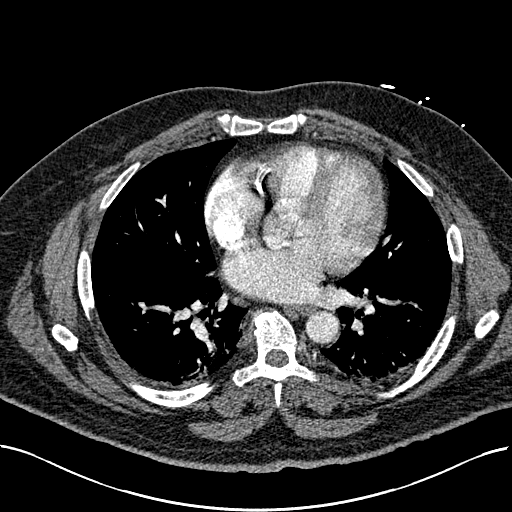
[im 140/280  lung]
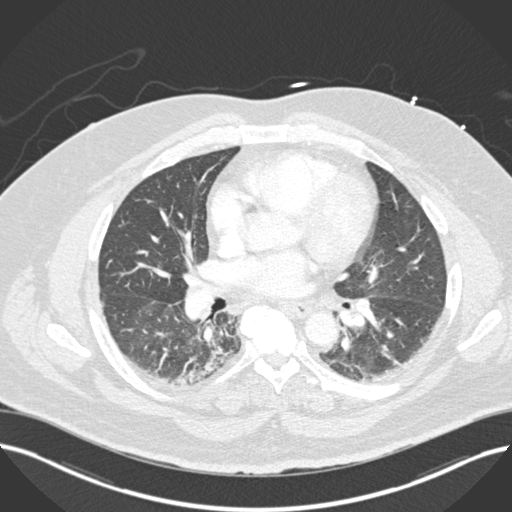
[im 154/280  mediastinal]
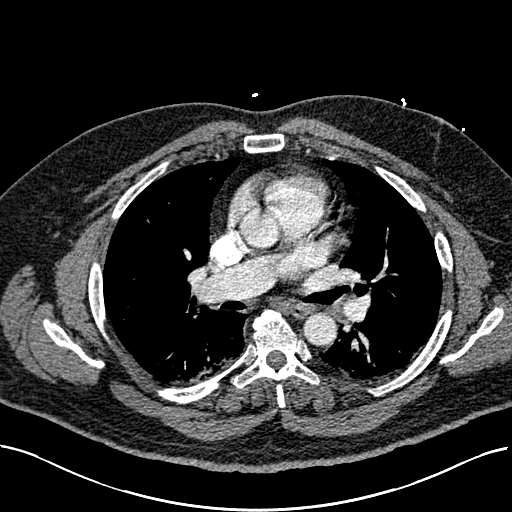
[im 168/280  lung]
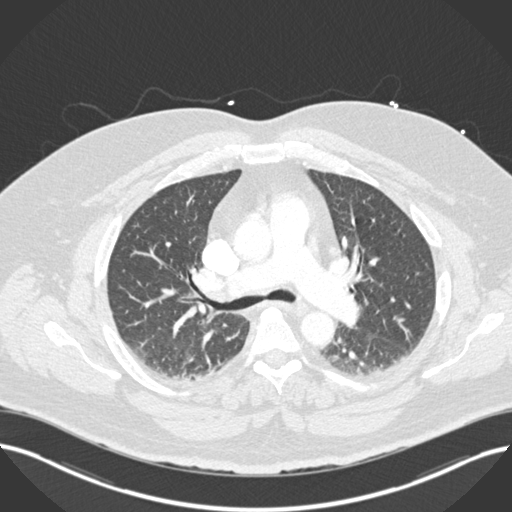
[im 182/280  mediastinal]
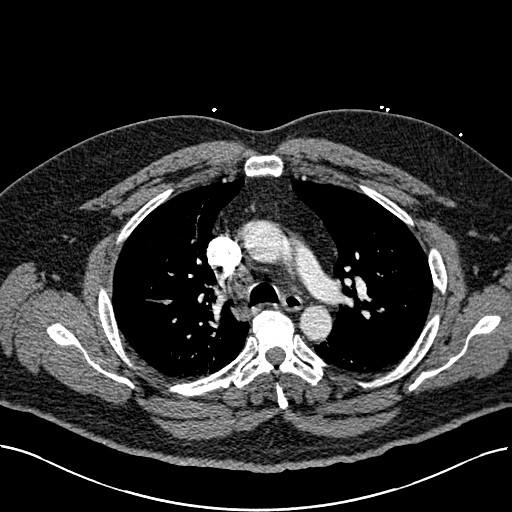
[im 196/280  lung]
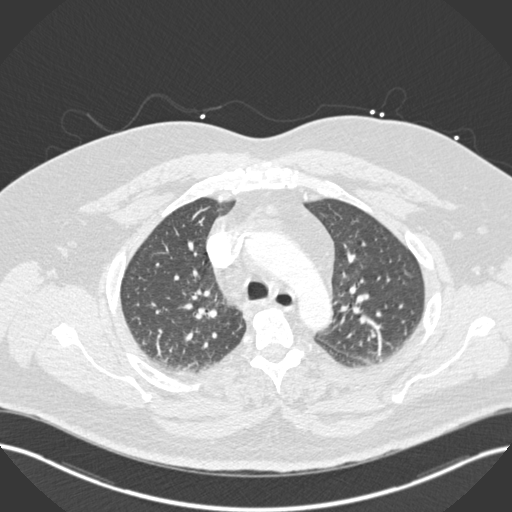
[im 224/280  mediastinal]
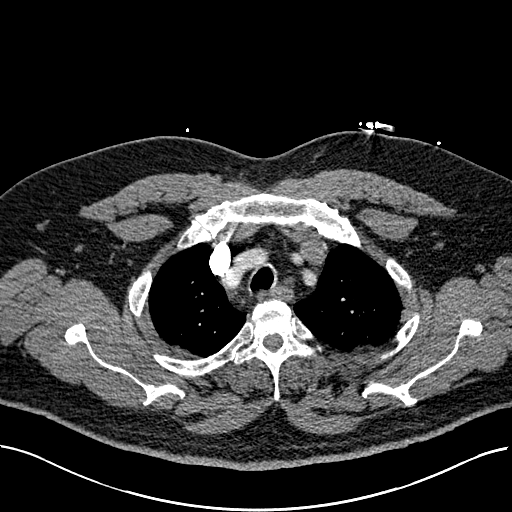
[im 238/280  lung]
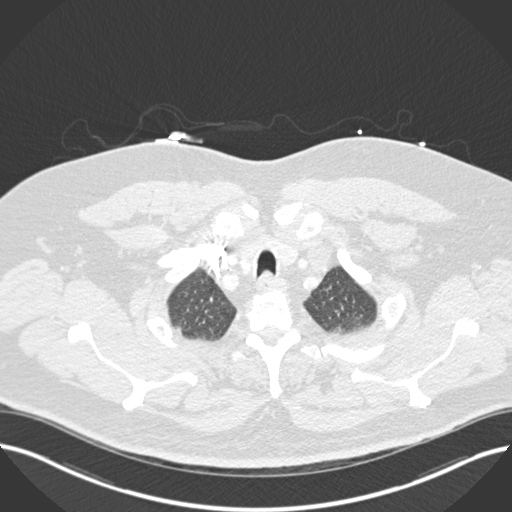
[im 252/280  mediastinal]
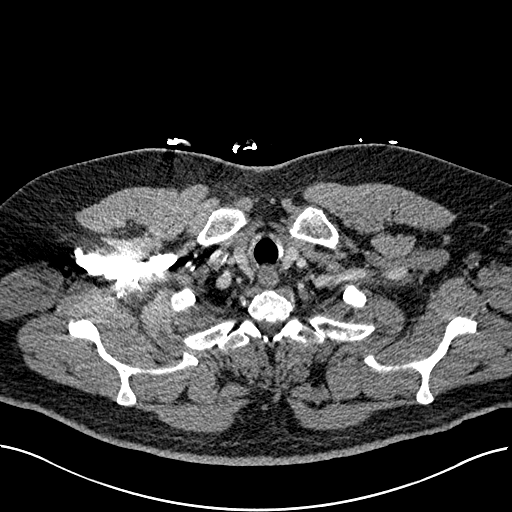
[im 266/280  lung]
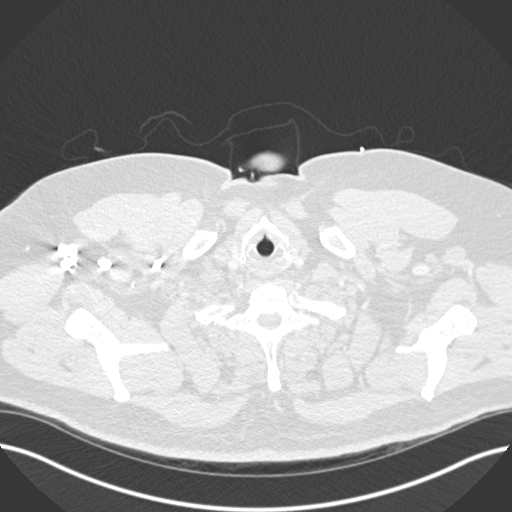

[18 of 36 positions shown; findings below may reference images not displayed]

FINDINGS: Cardiovascular: Heart is normal size. Mild calcified plaque over the
right coronary artery. No evidence of pulmonary embolism.

Mediastinum/Nodes: No evidence of mediastinal or hilar adenopathy.
Remaining mediastinal structures are within normal.

Lungs/Pleura: Lungs are adequately inflated without lobar
consolidation or effusion. There is minimal dependent bibasilar
atelectasis. Airways are normal.

Upper Abdomen: Within normal.

Musculoskeletal: Degenerative change of the spine.

Review of the MIP images confirms the above findings.
IMPRESSION: No evidence of pulmonary embolism. No acute cardiopulmonary disease.
Minimal bibasilar dependent atelectasis.

Minimal atherosclerotic coronary artery disease.

## 2018-08-25 DIAGNOSIS — M7062 Trochanteric bursitis, left hip: Secondary | ICD-10-CM | POA: Diagnosis not present

## 2018-08-25 DIAGNOSIS — Z96652 Presence of left artificial knee joint: Secondary | ICD-10-CM | POA: Diagnosis not present

## 2018-08-25 DIAGNOSIS — M1612 Unilateral primary osteoarthritis, left hip: Secondary | ICD-10-CM | POA: Diagnosis not present

## 2018-08-29 DIAGNOSIS — Z96652 Presence of left artificial knee joint: Secondary | ICD-10-CM | POA: Diagnosis not present

## 2018-08-29 DIAGNOSIS — M7062 Trochanteric bursitis, left hip: Secondary | ICD-10-CM | POA: Diagnosis not present

## 2018-08-29 DIAGNOSIS — M1612 Unilateral primary osteoarthritis, left hip: Secondary | ICD-10-CM | POA: Diagnosis not present

## 2018-09-03 DIAGNOSIS — M7062 Trochanteric bursitis, left hip: Secondary | ICD-10-CM | POA: Diagnosis not present

## 2018-09-03 DIAGNOSIS — Z96652 Presence of left artificial knee joint: Secondary | ICD-10-CM | POA: Diagnosis not present

## 2018-09-03 DIAGNOSIS — M1612 Unilateral primary osteoarthritis, left hip: Secondary | ICD-10-CM | POA: Diagnosis not present

## 2018-09-10 DIAGNOSIS — M7062 Trochanteric bursitis, left hip: Secondary | ICD-10-CM | POA: Diagnosis not present

## 2018-09-10 DIAGNOSIS — Z96652 Presence of left artificial knee joint: Secondary | ICD-10-CM | POA: Diagnosis not present

## 2018-09-10 DIAGNOSIS — M1612 Unilateral primary osteoarthritis, left hip: Secondary | ICD-10-CM | POA: Diagnosis not present

## 2018-09-23 DIAGNOSIS — Z96652 Presence of left artificial knee joint: Secondary | ICD-10-CM | POA: Diagnosis not present

## 2018-09-23 DIAGNOSIS — M7062 Trochanteric bursitis, left hip: Secondary | ICD-10-CM | POA: Diagnosis not present

## 2018-09-23 DIAGNOSIS — M1612 Unilateral primary osteoarthritis, left hip: Secondary | ICD-10-CM | POA: Diagnosis not present

## 2018-10-08 DIAGNOSIS — Z96652 Presence of left artificial knee joint: Secondary | ICD-10-CM | POA: Diagnosis not present

## 2018-10-08 DIAGNOSIS — M1612 Unilateral primary osteoarthritis, left hip: Secondary | ICD-10-CM | POA: Diagnosis not present

## 2018-10-08 DIAGNOSIS — M7062 Trochanteric bursitis, left hip: Secondary | ICD-10-CM | POA: Diagnosis not present

## 2018-10-30 ENCOUNTER — Telehealth: Payer: Self-pay | Admitting: Cardiovascular Disease

## 2018-10-30 ENCOUNTER — Emergency Department (HOSPITAL_COMMUNITY): Payer: 59

## 2018-10-30 ENCOUNTER — Emergency Department (HOSPITAL_COMMUNITY)
Admission: EM | Admit: 2018-10-30 | Discharge: 2018-10-30 | Disposition: A | Discharge status: Home / Self Care | Payer: 59 | Attending: Emergency Medicine | Admitting: Emergency Medicine

## 2018-10-30 ENCOUNTER — Other Ambulatory Visit: Payer: Self-pay

## 2018-10-30 DIAGNOSIS — R079 Chest pain, unspecified: Secondary | ICD-10-CM | POA: Diagnosis not present

## 2018-10-30 DIAGNOSIS — Z79899 Other long term (current) drug therapy: Secondary | ICD-10-CM | POA: Diagnosis not present

## 2018-10-30 DIAGNOSIS — I2510 Atherosclerotic heart disease of native coronary artery without angina pectoris: Secondary | ICD-10-CM | POA: Insufficient documentation

## 2018-10-30 DIAGNOSIS — Z7901 Long term (current) use of anticoagulants: Secondary | ICD-10-CM | POA: Insufficient documentation

## 2018-10-30 DIAGNOSIS — I10 Essential (primary) hypertension: Secondary | ICD-10-CM | POA: Diagnosis not present

## 2018-10-30 DIAGNOSIS — Z96652 Presence of left artificial knee joint: Secondary | ICD-10-CM | POA: Insufficient documentation

## 2018-10-30 DIAGNOSIS — I209 Angina pectoris, unspecified: Secondary | ICD-10-CM | POA: Diagnosis not present

## 2018-10-30 DIAGNOSIS — I208 Other forms of angina pectoris: Secondary | ICD-10-CM | POA: Insufficient documentation

## 2018-10-30 DIAGNOSIS — Z20828 Contact with and (suspected) exposure to other viral communicable diseases: Secondary | ICD-10-CM | POA: Diagnosis not present

## 2018-10-30 DIAGNOSIS — I25119 Atherosclerotic heart disease of native coronary artery with unspecified angina pectoris: Secondary | ICD-10-CM

## 2018-10-30 DIAGNOSIS — R0602 Shortness of breath: Secondary | ICD-10-CM | POA: Diagnosis not present

## 2018-10-30 DIAGNOSIS — R0789 Other chest pain: Secondary | ICD-10-CM | POA: Diagnosis present

## 2018-10-30 LAB — BASIC METABOLIC PANEL
Anion gap: 9 (ref 5–15)
BUN: 12 mg/dL (ref 6–20)
CO2: 22 mmol/L (ref 22–32)
Calcium: 8.9 mg/dL (ref 8.9–10.3)
Chloride: 108 mmol/L (ref 98–111)
Creatinine, Ser: 0.97 mg/dL (ref 0.61–1.24)
GFR calc Af Amer: >60 mL/min (ref >60)
GFR calc non Af Amer: >60 mL/min (ref >60)
Glucose, Bld: 108 mg/dL — ABNORMAL HIGH (ref 70–99)
Potassium: 3.9 mmol/L (ref 3.5–5.1)
Sodium: 139 mmol/L (ref 135–145)

## 2018-10-30 LAB — CBC
HCT: 43.6 % (ref 39.0–52.0)
Hemoglobin: 14.3 g/dL (ref 13.0–17.0)
MCH: 29.6 pg (ref 26.0–34.0)
MCHC: 32.8 g/dL (ref 30.0–36.0)
MCV: 90.3 fL (ref 80.0–100.0)
Platelets: 193 10*3/uL (ref 150–400)
RBC: 4.83 MIL/uL (ref 4.22–5.81)
RDW: 12.9 % (ref 11.5–15.5)
WBC: 4.3 10*3/uL (ref 4.0–10.5)
nRBC: 0 % (ref 0.0–0.2)

## 2018-10-30 LAB — TROPONIN I
Troponin I: <0.03 ng/mL (ref <0.03)
Troponin I: <0.03 ng/mL (ref <0.03)

## 2018-10-30 MED ORDER — ISOSORBIDE MONONITRATE ER 30 MG PO TB24
30.0000 mg | ORAL_TABLET | Freq: Every day | ORAL | Status: DC
  Administered 2018-10-30: 30 mg via ORAL
  Filled 2018-10-30: qty 1

## 2018-10-30 MED ORDER — ISOSORBIDE MONONITRATE ER 30 MG PO TB24: 60.0000 mg | ORAL_TABLET | Freq: Every day | ORAL | 1 refills | Status: DC

## 2018-10-30 NOTE — ED Notes (Signed)
This RN acting as Statistician and reached out to pt to see if he would like me to call any family. Pt requested I call his wife, Dewayne Hatch. Pt's wife informed that pt is doing well and will continue to update her as more information comes back.

## 2018-10-30 NOTE — Telephone Encounter (Signed)
Called patient back about his message. Patient complaining of having chest pain a couple of times a day for the last 2 weeks, SOB at times, and right hand numbness at times. Patient stated he takes nitroglycerin and this helps, patient stated he only had to take 2 a couple of times, all the rest just one dose. Patient stated back when he had all his heart issues his left hand use to go numb, and now his right hand gets numb and his right side of his chest is where he feels his pain. Patient denies any CP or SOB at this time, but patient states it is becoming worse and more frequent. Will forward to Micah Flesher PA for further advisement. Patient does have an appointment with Lisabeth Devoid PA next Wednesday. Will see if patient needs to be seen by DOD or virtual visit sooner then next week.

## 2018-10-30 NOTE — ED Triage Notes (Signed)
Pt here for evaluation of intermittent R chest pain with numbness in R hand  x 1 week with worsening today. Pain is intermittently sharp but is dull/tight on arrival to ED. Has been taking nitro at home with relief. Endorses being more shob than usual. Hx MI and 2 stents.

## 2018-10-30 NOTE — Consult Note (Signed)
Cardiology Consult Note:    Patient ID: Craig Copeland MRN: [id number]; DOB: Jun 09, 1956   Admission date: 10/30/2018  Primary Care Provider: [name], MD Primary Cardiologist: Thurmon Fair, MD  Primary Electrophysiologist:  None   Chief Complaint: Chest Pain   Patient Profile:   Craig Copeland is a 63 y.o. male with CAD, HTN, HLD, obesity and OSA presenting to the ED w/ CP.   History of Present Illness:   Craig Copeland, is a 63 y/o male, followed by Dr. Royann Shivers presenting to the ED w/ CC of CP.     He has a history of previous stent to the right coronary artery (2004, drug-eluting 3.0 x 33 mm Cypher stent overlapping 3.0 x 23 mm Cypher stent in the mid to proximal right coronary artery) and a moderate to severe ostial lesion of a small to medium ramus intermedius artery (too small for percutaneous revascularization).  His last cardiac catheterization was in March 2018, that showed findings identical to 2014, 2011 and 2004. He was suspected of having vasospastic angina, but he showed substantial improvement on treatment with Ranexa (ultimateley discontinued). He has normal left ventricle systolic function by echo most recently  performed in 2016. He also has  of hypertension and hyperlipidemia. Has had some statin intolerance. Failed Lipitor and Crestor due to myalgias. Has been able to tolerate pravastatin. He is morbidly obese. He is compliant with CPAP for obstructive sleep apnea. He is on chronic treatment with aspirin and clopidogrel.    He now presents to the [facility] ED w/ CP. Intermitted right sided chest pain occurring over the last 2 weeks. Also has had some associated right arm/ hand numbness and tingling. 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 Somewhat reproducible chest wall pain w/ palpation. CP improved some w/ SL NTG.  Reports full medication compliance. Initial troponin is negative. EKG shows NSR, old inferior infarct. BP stable.  HR upper 50s-low 60s. CBC and BMP normal. CXR unremarkable.   Past Medical History:  Diagnosis Date   CAD (coronary artery disease)    Dizziness    Dyslipidemia    Flu 06/2016   Had again 01/208 with PNA   Obesity    OSA (obstructive sleep apnea)    Systemic hypertension    Vasospastic angina [facility])     Past Surgical History:  Procedure Laterality Date   CARDIAC CATHETERIZATION  12/02/2002   <20% restenosis RCA   CARDIAC CATHETERIZATION  01/30/2010   20-30% in-stent restenosis,60-70% ostial stenosis   CORONARY ANGIOPLASTY WITH STENT PLACEMENT  11/13/2002   mid RCA   KNEE SURGERY  2003   LEFT HEART CATH AND CORONARY ANGIOGRAPHY N/A 09/05/2016   Procedure: Left Heart Cath and Coronary Angiography;  Surgeon: Kathleene Hazel, MD;  Location: [facility] INVASIVE CV LAB;  Service: Cardiovascular;  Laterality: N/A;   LEFT HEART CATHETERIZATION WITH CORONARY ANGIOGRAM N/A 12/22/2012   Procedure: LEFT HEART CATHETERIZATION WITH CORONARY ANGIOGRAM;  Surgeon: Thurmon Fair, MD;  Location: [facility] CATH LAB;  Service: Cardiovascular;  Laterality: N/A;   NOSE SURGERY     TOTAL KNEE ARTHROPLASTY Left 03/22/2017   Procedure: LEFT TOTAL KNEE ARTHROPLASTY;  Surgeon: Eugenia Mcalpine, MD;  Location: [facility] ORS;  Service: Orthopedics;  Laterality: Left;   VASECTOMY       Medications Prior to Admission: Prior to Admission medications   Medication Sig Start Date End Date Taking? Authorizing Provider  amLODipine (NORVASC) 5 MG tablet Take 1 tablet (5 mg total)  by mouth every evening. 05/08/18  Yes Croitoru, Mihai, MD  cholecalciferol (VITAMIN D) 1000 units tablet Take 1,000 Units by mouth daily.   Yes [provider]  clopidogrel (PLAVIX) 75 MG tablet Take 75 mg by mouth daily.   Yes [provider]  diclofenac (VOLTAREN) 75 MG EC tablet Take 75 mg by mouth 2 (two) times daily as needed (knee pain).    Yes [provider]  ezetimibe (ZETIA) 10 MG tablet Take 10 mg by mouth  every evening.    Yes [provider]  fish oil-omega-3 fatty acids 1000 MG capsule Take 1 g by mouth daily.    Yes [provider]  isosorbide mononitrate (IMDUR) 30 MG 24 hr tablet Take 1 tablet (30 mg total) by mouth daily. 05/08/18  Yes Croitoru, Mihai, MD  losartan (COZAAR) 100 MG tablet Take 0.5 tablets (50 mg total) by mouth daily. 12/22/12  Yes Croitoru, Mihai, MD  nitroGLYCERIN (NITROSTAT) 0.4 MG SL tablet Place 1 tablet (0.4 mg total) under the tongue every 5 (five) minutes as needed for chest pain. NEED OV. 11/08/17  Yes Croitoru, Mihai, MD  pravastatin (PRAVACHOL) 80 MG tablet Take 80 mg by mouth at bedtime.    Yes [provider]  tamsulosin (FLOMAX) 0.4 MG CAPS capsule Take 0.4 mg by mouth daily.   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 strain: Not on file   Food insecurity:    Worry: Not on file    Inability: Not on file   Transportation needs:    Medical: Not on file    Non-medical: Not on file  Tobacco Use   Smoking status: Never Smoker   Smokeless tobacco: Never Used  Substance and Sexual Activity   Alcohol use: Yes    Alcohol/week: 1.0 standard drinks    Types: 1 Cans of beer per week    Comment: Weekly   Drug use: No   Sexual activity: Not on file  Lifestyle   Physical activity:    Days per week: Not on file    Minutes per session: Not on file   Stress: Not on file  Relationships   Social connections:    Talks on phone: Not on file    Gets together: Not on file    Attends religious service: Not on file      Active member of club or organization: Not on file    Attends meetings of clubs or organizations: Not on file    Relationship status: Not on file   Intimate partner violence:    Fear of current or ex partner: Not on file    Emotionally abused: Not on file    Physically abused: Not on file    Forced sexual activity: Not on file  Other Topics Concern   Not on file  Social History Narrative   Patient is married with two children.   Patient is right handed.   Patient has 12th grade education.   Patient does not drink caffeine.    Family  History:   The patient's family history includes Alzheimer's disease in his paternal grandfather; Cancer in his paternal grandfather; Heart failure in his father and maternal grandmother; Hypertension in his father; Stroke in his paternal grandmother.    ROS:  Please see the history of present illness.  All other ROS reviewed and negative.     Physical Exam/Data:   Vitals:   10/30/18 1215 10/30/18 1230 10/30/18 1245 10/30/18 1315  BP: 123/68 123/70 120/76 124/67  Pulse: 67 65 (!) 59 62  Resp: Temp:      TempSrc:      SpO2: 98% 96% 96% 96%  Weight:      Height:       No intake or output data in the 24 hours ending 10/30/18 1417 Last 3 Weights 10/30/2018 06/20/2018 05/08/2018  Weight (lbs) 290 lb 310 lb 12.8 oz 308 lb 3.2 oz  Weight (kg) 131.543 kg 140.978 kg 139.799 kg     Body mass index is 38.26 kg/m.  General:  Well nourished, well developed, in no acute distress, obese HEENT: normal Lymph: no adenopathy Neck: no JVD Endocrine:  No thryomegaly Vascular: No carotid bruits; FA pulses 2+ bilaterally without bruits  Cardiac:  normal S1, S2; RRR; no murmur  Lungs:  clear to auscultation bilaterally, no wheezing, rhonchi or rales  Abd: soft, nontender, no hepatomegaly  Ext: no edema Musculoskeletal:  No deformities, BUE and BLE strength normal and equal Skin: warm and dry  Neuro:  CNs 2-12 intact, no focal abnormalities  noted Psych:  Normal affect    EKG:  The ECG that was done 4/30 was personally reviewed and demonstrates NSR old inferior infarct   Relevant CV Studies: 2D Echo 2016  Study Conclusions  - Left ventricle: The cavity size was normal. Wall thickness was   increased in a pattern of mild LVH. Systolic function was normal.   The estimated ejection fraction was in the range of 55% to 60%.   Wall motion was normal; there were no regional wall motion   abnormalities. Left ventricular diastolic function parameters   were normal. - Aortic valve: Mildly calcified annulus. Trileaflet. - Mitral valve: There was trivial regurgitation. - Left atrium: The atrium was at the upper limits of normal in   size. - Right atrium: Central venous pressure (est): 3 mm Hg. - Pulmonary arteries: Systolic pressure could not be accurately   estimated. - Pericardium, extracardiac: There was no pericardial effusion.  Impressions:  - Mild LVH with LVEF 55-60% and grossly normal diastolic function.   Upper normal left atrial chamber size. Aortic valve is mildly   sclerotic.  LHC 2018 Conclusion     Prox RCA to Dist RCA lesion, 20 %stenosed.  Ost Ramus lesion, 70 %stenosed.  Ost 1st Diag to 1st Diag lesion, 20 %stenosed.  Mid LAD to Dist LAD lesion, 20 %stenosed.  Prox LAD lesion, 20 %stenosed.  The left ventricular ejection fraction is 50-55% by visual estimate.  The left ventricular systolic function is normal.  LV end diastolic pressure is normal.  There is no mitral valve regurgitation.   1. Single vessel CAD with patent stents proximal/mid RCA with minimal stent restenosis 2. Mild non-obstructive disease in the LAD with segment of intramyocardial bridging in the mid to distal LAD 3. Small caliber intermediate branch with moderately severe ostial stenosis, unchanged from cath in 2014.  4. Normal LV systolic function  Recommendations: Continue medical management of CAD.      Laboratory  Data:  Chemistry Recent Labs  Lab 10/30/18 1233  NA 139  K 3.9  CL 108  CO2 22  GLUCOSE 108*  BUN 12  CREATININE 0.97  CALCIUM 8.9  GFRNONAA >60  GFRAA >60  ANIONGAP 9    No results for input(s): PROT, ALBUMIN, AST, ALT, ALKPHOS, BILITOT in the last 168 hours. Hematology Recent Labs  Lab 10/30/18 1233  WBC 4.3  RBC 4.83  HGB 14.3  HCT 43.6  MCV 90.3  MCH 29.6  MCHC 32.8  RDW 12.9  PLT 193   Cardiac Enzymes Recent Labs  Lab 10/30/18 1233  TROPONINI <0.03   No results for input(s): TROPIPOC in the last 168 hours.  BNPNo results for input(s): BNP, PROBNP in the last 168 hours.  DDimer No results for input(s): DDIMER in the last 168 hours.  Radiology/Studies:  Dg Chest 2 View  Result Date: 10/30/2018 CLINICAL DATA:  Chest pain and shortness of breath EXAM: CHEST - 2 VIEW COMPARISON:  April 30, 2017 FINDINGS: Lungs are clear. Heart is mildly enlarged with pulmonary vascularity normal. No adenopathy. No pneumothorax. There is mild degenerative change in the thoracic spine. IMPRESSION: Mild cardiac enlargement.  No edema or consolidation. Electronically Signed   By: Bretta BangWilliam  Woodruff III M.D.   On: 10/30/2018 13:13    Assessment and Plan:   1. Chest Pain/CAD: prior h/o RCA stents. Last cath in 2018 showed patent stents and moderate to severe ostial lesion of a small to medium ramus intermedius artery (too small for percutaneous revascularization). Medical therapy advised.  Also suspected to have microvascular dz. Recent CP/ arm pain may be musculoskeletal. Works in remodeling, carrying and lifting heavy objects and had has some associated right arm numbness and tingling. He also has some reproducible chest wall pain w/ palpation. However notes pain improved some w/ SL NTG. EKG is nonischemic and initial troponin is negative. Currently pain free. We will increase his Imdur to 60 mg daily (usually takes 30 mg at noon). Will give another 30 mg now in ED.  Plan to take 60 mg dose at noon tomorrow. We will check a second troponin and if negative, will plan to release from the ED with close 7 day virtual evisit w/ Dr. Royann Shiversroitoru to reassess for recurrent/ worsening symptoms.   2. HTN: controlled in the ED. Continue on home regimen. Will increase his home Imdur to 60 mg daily.   3. HLD:  Has had some statin intolerance. Failed Lipitor and Crestor due to myalgias. Has been able to tolerate pravastatin + Zetia.   4. Obesity: Body mass index is 38.26 kg/m. weight loss/ diet modification advised.   5. OSA: on CPAP QHS   For questions or updates, please contact [facility] Please consult [email or web address] for contact info under    Signed, Robbie LisBrittainy Simmons, PA-C  10/30/2018 2:6417 PM    63 year old male with known coronary artery disease status post long segment of RCA stenting who has had multiple catheterizations all virtually identical last one being in 2018 here with chest discomfort.  He had an episode yesterday of right-sided chest discomfort with numbness experienced in his right hand.  He has been experiencing similar type of episodes over the past month mild in comparison to yesterday's.  Yesterday, he sat down took nitroglycerin and it seemed to help his discomfort.  He states that this was different than his prior anginal symptoms which were left-sided.  Has not been experiencing any syncope bleeding orthopnea significant shortness of breath.  Physical  exam as above.  He did have some mild right sternal tenderness.  Full range of motion of right hand, squeezing normal.  Did not feel any paresthesias.  Troponin normal.  Second pending.  EKG no ischemic changes, unchanged.  Assessment and plan:  Right-sided chest discomfort/angina Coronary artery disease-RCA stents Hyperlipidemia Hypertension Morbid obesity Obstructive sleep apnea  - Plan is to proceed with increasing his isosorbide to 60 mg once a day from 30. -Check a second troponin.   If normal, I am comfortable with his discharge from the emergency room with continued surveillance.  Close follow-up in 1 week with Dr. Vickii Chafe virtually or his APP team. -Discussed with ER physician Dr. Patria Mane. - I feel that some of his symptoms may be neuropathic, right handed numbness especially after carrying his heavy saw.  This may also be causing some of his right sided chest discomfort as well if his pectoralis muscle were sore.  However, he does have known coronary artery disease.  Thankfully however he has had multiple cardiac catheterizations that showed no major change in his anatomy since his right coronary artery stents were placed.  Obviously, if symptoms worsen or become more worrisome we can always pursue further testing.  Patient is comfortable with being discharged from the ER.  Donato Schultz, MD

## 2018-10-30 NOTE — Telephone Encounter (Signed)
Message received from Triage. I returned patient call.   This patient with chest pain responsive to nitro x 2 weeks with known CAD needs to go to the ER for further evaluation. I spoke with him about options and why I think he needs to be seen. He understands and will report to [facility].  I will send staff a message.  Roe Rutherford Brailee Riede, PA-C 10/30/2018, 10:21 AM

## 2018-10-30 NOTE — ED Provider Notes (Signed)
[facility] Provider Note   CSN: [id number] Arrival date & time: 10/30/18  1158    History   Chief Complaint Chief Complaint  Patient presents with  . Chest Pain    HPI Craig Copeland is a 63 y.o. male.     HPI 63 year old male with a history of coronary artery disease status post prior PCI.  He presents the emergency department at the recommendation of his cardiology team for intermittent right-sided chest tightness relieved with nitroglycerin over the past 2 weeks.  He states this is exertional in nature.  His last heart cath was in 2018 which demonstrated patent stents and nonobstructive coronary artery disease of his LAD.  Medical management at that time was recommended.  He has been compliant with all of his medications.  He does not smoke tobacco.  His pain waxes and wanes.  Currently it is minimal and noted in the right chest without significant radiation.  He has some intermittent numbness in his right hand associated with it.  Reports shortness of breath.  No fevers or chills.  No recent travel.  No known contact with COVID-19 or COVID-19 patients under investigation Past Medical History:  Diagnosis Date  . CAD (coronary artery disease)   . Dizziness   . Dyslipidemia   . Flu 06/2016   Had again 01/208 with PNA  . Obesity   . OSA (obstructive sleep apnea)   . Systemic hypertension   . 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 Osteoarthritis of right knee 03/22/2017  . S/P knee replacement 03/22/2017  . Chest pain with high risk for cardiac etiology 08/28/2016  . Murmur, cardiac 04/01/2015  . Angina pectoris syndrome (HCC) 12/18/2012  . CAD (coronary artery disease) 12/18/2012  . Hyperlipidemia 12/18/2012  . Essential hypertension 12/18/2012  . Morbid obesity (HCC) 12/18/2012  . Obstructive sleep apnea 12/18/2012    Past Surgical History:  Procedure Laterality Date  . CARDIAC  CATHETERIZATION  12/02/2002   <20% restenosis RCA  . CARDIAC CATHETERIZATION  01/30/2010   20-30% in-stent restenosis,60-70% ostial stenosis  . CORONARY ANGIOPLASTY WITH STENT PLACEMENT  11/13/2002   mid RCA  . KNEE SURGERY  2003  . LEFT HEART CATH AND CORONARY ANGIOGRAPHY N/A 09/05/2016   Procedure: Left Heart Cath and Coronary Angiography;  Surgeon: Kathleene Hazelhristopher D McAlhany, MD;  [facility];  Service: Cardiovascular;  Laterality: N/A;  . LEFT HEART CATHETERIZATION WITH CORONARY ANGIOGRAM N/A 12/22/2012   Procedure: LEFT HEART CATHETERIZATION WITH CORONARY ANGIOGRAM;  Surgeon: Thurmon FairMihai Croitoru, MD;  Location: [facility] CATH LAB;  Service: Cardiovascular;  Laterality: N/A;  . NOSE SURGERY    . TOTAL KNEE ARTHROPLASTY Left 03/22/2017   Procedure: LEFT TOTAL KNEE ARTHROPLASTY;  Surgeon: Eugenia Mcalpineollins, Robert, MD;  Location: [facility] ORS;  Service: Orthopedics;  Laterality: Left;  [name]. VASECTOMY          Home Medications    Prior to Admission medications   Medication Sig Start Date End Date Taking? Authorizing Provider  amLODipine (NORVASC) 5 MG tablet Take 1 tablet (5 mg total) by mouth every evening. 05/08/18  Yes Croitoru, Mihai, MD  cholecalciferol (VITAMIN D) 1000 units tablet Take 1,000 Units by mouth daily.   Yes [provider]  clopidogrel (PLAVIX) 75 MG tablet Take 75 mg by mouth daily.   Yes [provider]  diclofenac (VOLTAREN) 75 MG EC tablet Take 75 mg by mouth 2 (two) times  daily as needed (knee pain).    Yes [provider]  ezetimibe (ZETIA) 10 MG tablet Take 10 mg by mouth every evening.    Yes [provider]  fish oil-omega-3 fatty acids 1000 MG capsule Take 1 g by mouth daily.    Yes [provider]  isosorbide mononitrate (IMDUR) 30 MG 24 hr tablet Take 1 tablet (30 mg total) by mouth daily. 05/08/18  Yes Croitoru, Mihai, MD  losartan (COZAAR) 100 MG tablet Take 0.5 tablets (50 mg total) by mouth daily. 12/22/12  Yes Croitoru, Mihai, MD   nitroGLYCERIN (NITROSTAT) 0.4 MG SL tablet Place 1 tablet (0.4 mg total) under the tongue every 5 (five) minutes as needed for chest pain. NEED OV. 11/08/17  Yes Croitoru, Mihai, MD  pravastatin (PRAVACHOL) 80 MG tablet Take 80 mg by mouth at bedtime.    Yes [provider]  tamsulosin (FLOMAX) 0.4 MG CAPS capsule Take 0.4 mg by mouth daily.   Yes [provider]    Family History Family History  Problem Relation Age of Onset  . Heart failure Father   . Hypertension Father   . Heart failure Maternal Grandmother   . Stroke Paternal Grandmother   . Cancer Paternal Grandfather   . Alzheimer's disease Paternal Grandfather     Social History Social History   Tobacco Use  . Smoking status: Never Smoker  . Smokeless tobacco: Never Used  Substance Use Topics  . Alcohol use: Yes    Alcohol/week: 1.0 standard drinks    Types: 1 Cans of beer per week    Comment: Weekly  . Drug use: No     Allergies   Patient has no known allergies.   Review of Systems Review of Systems  All other systems reviewed and are negative.    Physical Exam Updated Vital Signs BP 123/70   Pulse 65   Temp 98.8 F (37.1 C) (Oral)   Resp 15   Ht  (1.854 m)   Wt 131.5 kg   SpO2 96%   BMI 38.26 kg/m   Physical Exam Vitals signs and nursing note reviewed.  Constitutional:      Appearance: He is well-developed.  HENT:     Head: Normocephalic and atraumatic.  Neck:     Musculoskeletal: Normal range of motion.  Cardiovascular:     Rate and Rhythm: Normal rate and regular rhythm.     Heart sounds: Normal heart sounds.  Pulmonary:     Effort: Pulmonary effort is normal. No respiratory distress.     Breath sounds: Normal breath sounds.  Abdominal:     General: There is no distension.     Palpations: Abdomen is soft.     Tenderness: There is no abdominal tenderness.  Musculoskeletal: Normal range of motion.  Skin:    General: Skin is warm and dry.  Neurological:      Mental Status: He is alert and oriented to person, place, and time.  Psychiatric:        Judgment: Judgment normal.      ED Treatments / Results  Labs (all labs ordered are listed, but only abnormal results are displayed) Labs Reviewed  BASIC METABOLIC PANEL - Abnormal; Notable for the following components:      Result Value   Glucose, Bld 108 (*)    All other components within normal limits  CBC  TROPONIN I    EKG EKG Interpretation  Date/Time:  Thursday October 30 2018 12:09:20 EDT Ventricular Rate:  72 PR Interval:    QRS Duration: 104 QT Interval:  397 QTC Calculation: 435 R Axis:   -39 Text Interpretation:  Sinus rhythm Inferior infarct, old No significant change was found Confirmed by Azalia Bilis ([id number]) on 10/30/2018 12:11:43 PM   Radiology Dg Chest 2 View  Result Date: 10/30/2018 CLINICAL DATA:  Chest pain and shortness of breath EXAM: CHEST - 2 VIEW COMPARISON:  April 30, 2017 FINDINGS: Lungs are clear. Heart is mildly enlarged with pulmonary vascularity normal. No adenopathy. No pneumothorax. There is mild degenerative change in the thoracic spine. IMPRESSION: Mild cardiac enlargement.  No edema or consolidation. Electronically Signed   By: Bretta Bang III M.D.   On: 10/30/2018 13:13    Procedures Procedures (including critical care time)  Medications Ordered in ED Medications - No data to display   Initial Impression / Assessment and Plan / ED Course  I have reviewed the triage vital signs and the nursing notes.  Pertinent labs & imaging results that were available during my care of the patient were reviewed by me and considered in my medical decision making (see chart for details).        History of coronary artery disease.  Some typical symptoms of coronary disease.  EKG without ischemic changes.  Initial troponin is negative.  Cardiology consultation in the emergency department for disposition planning.    Final Clinical Impressions(s) / ED  Diagnoses   Final diagnoses:  Unstable angina [facility])    ED Discharge Orders    None       Azalia Bilis, MD 10/30/18 1358

## 2018-10-30 NOTE — Telephone Encounter (Signed)
  Pt c/o of Chest Pain: STAT if CP now or developed within 24 hours  1. Are you having CP right now? no  2. Are you experiencing any other symptoms (ex. SOB, nausea, vomiting, sweating)? SOB  3. How long have you been experiencing CP? 2 weeks  4. Is your CP continuous or coming and going? Comes and goes but it does happen every day  5. Have you taken Nitroglycerin? Yes, and that makes it better  Made appt w/ Lisabeth Devoid on 11/05/18 ?

## 2018-10-30 NOTE — ED Provider Notes (Signed)
3:34 PM Pt seen and evaluated by Dr Anne Fu, Cardiology. I discussed the case with him. Per cardiology pt can be discharged home with increased imdur (60mg ) if second troponin negative. Cardiology will follow up the patient as an outpatient   Azalia Bilis, MD 10/30/18 1540

## 2018-11-05 ENCOUNTER — Telehealth: Payer: Self-pay | Admitting: Physician Assistant

## 2018-11-05 ENCOUNTER — Telehealth: Payer: 59 | Admitting: Physician Assistant

## 2018-11-05 NOTE — Telephone Encounter (Signed)
Mychart pending, smartphone, pre reg complete 11/05/18 AF

## 2018-11-06 ENCOUNTER — Encounter: Payer: Self-pay | Admitting: Physician Assistant

## 2018-11-06 ENCOUNTER — Telehealth (INDEPENDENT_AMBULATORY_CARE_PROVIDER_SITE_OTHER): Payer: 59 | Admitting: Physician Assistant

## 2018-11-06 ENCOUNTER — Telehealth: Payer: Self-pay

## 2018-11-06 DIAGNOSIS — I10 Essential (primary) hypertension: Secondary | ICD-10-CM

## 2018-11-06 DIAGNOSIS — I25118 Atherosclerotic heart disease of native coronary artery with other forms of angina pectoris: Secondary | ICD-10-CM | POA: Diagnosis not present

## 2018-11-06 DIAGNOSIS — Z9989 Dependence on other enabling machines and devices: Secondary | ICD-10-CM

## 2018-11-06 DIAGNOSIS — G4733 Obstructive sleep apnea (adult) (pediatric): Secondary | ICD-10-CM

## 2018-11-06 DIAGNOSIS — E785 Hyperlipidemia, unspecified: Secondary | ICD-10-CM

## 2018-11-06 NOTE — Patient Instructions (Signed)
Medication Instructions:   Your physician recommends that you continue on your current medications as directed. Please refer to the Current Medication list given to you today.  If you need a refill on your cardiac medications before your next appointment, please call your pharmacy.   Lab work:  NONE ordered at this time of appointment  If you have labs (blood work) drawn today and your tests are completely normal, you will receive your results only by: [name] MyChart Message (if you have MyChart) OR . A paper copy in the mail If you have any lab test that is abnormal or we need to change your treatment, we will call you to review the results.  Testing/Procedures:  NONE ordered at this time of appointment   Follow-Up: At [facility], you and your health needs are our priority.  As part of our continuing mission to provide you with exceptional heart care, we have created designated Provider Care Teams.  These Care Teams include your primary Cardiologist (physician) and Advanced Practice Providers (APPs -  Physician Assistants and Nurse Practitioners) who all work together to provide you with the care you need, when you need it. . You will need a follow up appointment in 1 months with Thurmon Fair, MD   Any Other Special Instructions Will Be Listed Below (If Applicable).

## 2018-11-06 NOTE — Telephone Encounter (Signed)
Virtual Visit Pre-Appointment Phone Call  "(Name), I am calling you today to discuss your upcoming appointment. We are currently trying to limit exposure to the virus that causes COVID-19 by seeing patients at home rather than in the office."  1. "What is the BEST phone number to call the day of the visit?" - include this in appointment notes  2. "Do you have or have access to (through a family member/friend) a smartphone with video capability that we can use for your visit?" a. If yes - list this number in appt notes as "cell" (if different from BEST phone #) and list the appointment type as a VIDEO visit in appointment notes b. If no - list the appointment type as a PHONE visit in appointment notes  3. Confirm consent - "In the setting of the current Covid19 crisis, you are scheduled for a PHONE visit with your provider on 11/06/2018 at 2:30PM.  Just as we do with many in-office visits, in order for you to participate in this visit, we must obtain consent.  If you'd like, I can send this to your mychart (if signed up) or email for you to review.  Otherwise, I can obtain your verbal consent now.  All virtual visits are billed to your insurance company just like a normal visit would be.  By agreeing to a virtual visit, we'd like you to understand that the technology does not allow for your provider to perform an examination, and thus may limit your provider's ability to fully assess your condition. If your provider identifies any concerns that need to be evaluated in person, we will make arrangements to do so.  Finally, though the technology is pretty good, we cannot assure that it will always work on either your or our end, and in the setting of a video visit, we may have to convert it to a phone-only visit.  In either situation, we cannot ensure that we have a secure connection.  Are you willing to proceed?" STAFF: Did the patient verbally acknowledge consent to telehealth visit? Document YES/NO  here: YES  4. Advise patient to be prepared - "Two hours prior to your appointment, go ahead and check your blood pressure, pulse, oxygen saturation, and your weight (if you have the equipment to check those) and write them all down. When your visit starts, your provider will ask you for this information. If you have an Apple Watch or Kardia device, please plan to have heart rate information ready on the day of your appointment. Please have a pen and paper handy nearby the day of the visit as well."  5. Give patient instructions for MyChart download to smartphone OR [email or web address] as below if video visit (depending on what platform provider is using)  6. Inform patient they will receive a phone call 15 minutes prior to their appointment time (may be from unknown caller ID) so they should be prepared to answer    TELEPHONE CALL NOTE  Craig FisherMichael K Copeland has been deemed a candidate for a follow-up tele-health visit to limit community exposure during the Covid-19 pandemic. I spoke with the patient via phone to ensure availability of phone/video source, confirm preferred email & phone number, and discuss instructions and expectations.  I reminded Craig FisherMichael K Copeland to be prepared with any vital sign and/or heart rhythm information that could potentially be obtained via home monitoring, at the time of his visit. I reminded Craig FisherMichael K Copeland to expect a phone call prior to his visit.  [name], CMA 11/06/2018 2:26 PM   INSTRUCTIONS FOR DOWNLOADING THE MYCHART APP TO SMARTPHONE  - The patient must first make sure to have activated MyChart and know their login information - If Apple, go to [employer] and type in MyChart in the search bar and download the app. If Android, ask patient to go to [employer] and type in [place] in the search bar and download the app. The app is free but as with any other app downloads, their phone may require them to verify saved payment information or  Apple/Android password.  - The patient will need to then log into the app with their MyChart username and password, and select [place] as their healthcare provider to link the account. When it is time for your visit, go to the MyChart app, find appointments, and click Begin Video Visit. Be sure to Select Allow for your device to access the Microphone and Camera for your visit. You will then be connected, and your provider will be with you shortly.  **If they have any issues connecting, or need assistance please contact MyChart service desk [phone])**  **If using a computer, in order to ensure the best quality for their visit they will need to use either of the following Internet Browsers: [employer] or Google Chrome**  IF USING DOXIMITY or DOXY.ME - The patient will receive a link just prior to their visit by text.     FULL LENGTH CONSENT FOR TELE-HEALTH VISIT   I hereby voluntarily request, consent and authorize [facility] and its employed or contracted physicians, physician assistants, nurse practitioners or other licensed health care professionals (the Practitioner), to provide me with telemedicine health care services (the "Services") as deemed necessary by the treating Practitioner. I acknowledge and consent to receive the Services by the Practitioner via telemedicine. I understand that the telemedicine visit will involve communicating with the Practitioner through live audiovisual communication technology and the disclosure of certain medical information by electronic transmission. I acknowledge that I have been given the opportunity to request an in-person assessment or other available alternative prior to the telemedicine visit and am voluntarily participating in the telemedicine visit.  I understand that I have the right to withhold or withdraw my consent to the use of telemedicine in the course of my care at any time, without affecting my right to future care  or treatment, and that the Practitioner or I may terminate the telemedicine visit at any time. I understand that I have the right to inspect all information obtained and/or recorded in the course of the telemedicine visit and may receive copies of available information for a reasonable fee.  I understand that some of the potential risks of receiving the Services via telemedicine include:  [name] Delay or interruption in medical evaluation due to technological equipment failure or disruption; . Information transmitted may not be sufficient (e.g. poor resolution of images) to allow for appropriate medical decision making by the Practitioner; and/or  . In rare instances, security protocols could fail, causing a breach of personal health information.  Furthermore, I acknowledge that it is my responsibility to provide information about my medical history, conditions and care that is complete and accurate to the best of my ability. I acknowledge that Practitioner's advice, recommendations, and/or decision may be based on factors not within their control, such as incomplete or inaccurate data provided by me or distortions of diagnostic images or specimens that may result from electronic transmissions. I understand that the  practice of medicine is not an Chief Strategy Officer and that Practitioner makes no warranties or guarantees regarding treatment outcomes. I acknowledge that I will receive a copy of this consent concurrently upon execution via email to the email address I last provided but may also request a printed copy by calling the office of [place].    I understand that my insurance will be billed for this visit.   I have read or had this consent read to me. . I understand the contents of this consent, which adequately explains the benefits and risks of the Services being provided via telemedicine.  . I have been provided ample opportunity to ask questions regarding this consent and the Services and have had  my questions answered to my satisfaction. . I give my informed consent for the services to be provided through the use of telemedicine in my medical care  By participating in this telemedicine visit I agree to the above.

## 2018-11-06 NOTE — Progress Notes (Signed)
Virtual Visit via Telephone Note   This visit type was conducted due to national recommendations for restrictions regarding the COVID-19 Pandemic (e.g. social distancing) in an effort to limit this patient's exposure and mitigate transmission in our community.  Due to his co-morbid illnesses, this patient is at least at moderate risk for complications without adequate follow up.  This format is felt to be most appropriate for this patient at this time.  The patient did not have access to video technology/had technical difficulties with video requiring transitioning to audio format only (telephone).  All issues noted in this document were discussed and addressed.  No physical exam could be performed with this format.  Please refer to the patient's chart for his  consent to telehealth for [facility].   Date:  11/06/2018   ID:  Craig Copeland, DOB 06-Oct-1955, MRN [id number]  Patient Location: Home Provider Location: Home  PCP:  Merri BrunettePharr, [name], MD  Cardiologist:  Thurmon FairMihai Croitoru, MD  Electrophysiologist:  None   Evaluation Performed:  Follow-Up Visit  Chief Complaint:  followup  History of Present Illness:    Craig Copeland is a 63 y.o. male with PMH of CAD, hyperlipidemia, OSA on CPAP and hypertension.  He had a history of DES x2 to RCA in 2004. He had a cardiac catheterization in March 2018 which showed patent RCA stent and ostial 70% lesion in small ramus.  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  He had significant improvement on Ranexa, this was subsequently discontinued. Echocardiogram obtained on 04/13/2015 showed EF 55 to 60%.    During the previous office visit was November 2019, Imdur was increased to 30 mg daily and amlodipine was reduced to 5 mg daily due to lower extremity edema.  When I saw him back in December 2019, his lower extremity edema has significantly improved.  More recently, patient was seen in the ED on 10/30/2018 with chest pain.  The chest pain was  reproducible with palpation.  His EKG was nonischemic.  Troponin was negative.  His Imdur was further uptitrated to 60 mg daily and he was discharged to follow-up as outpatient.  Patient was contacted today via telephone visit.  Despite the hospital assessment I suggested the pain was reproducible with palpation, patient denies this.  Instead he says the symptom seems to occur with more strenuous activity but not with every day activity.  He denies any exacerbating factors such as deep inspiration, body rotation, or palpation.  The symptom he is describing sounds more like stable angina.  It is occurring about once a week.  This is fairly controlled on the current dose of Imdur.  I will continue on the current medication.  He will return in 1 month for reassessment, he is aware to contact cardiology if symptoms become more frequent or lasting longer.  If symptoms does increase, he likely will need a repeat cardiac catheterization.  The patient does not have symptoms concerning for COVID-19 infection (fever, chills, cough, or new shortness of breath).    Past Medical History:  Diagnosis Date  . CAD (coronary artery disease)   . Dizziness   . Dyslipidemia   . Flu 06/2016   Had again 01/208 with PNA  . Obesity   . OSA (obstructive sleep apnea)   . Systemic hypertension   . Vasospastic angina [facility](HCC)    Past Surgical History:  Procedure Laterality Date  . CARDIAC CATHETERIZATION  12/02/2002   <20% restenosis RCA  . CARDIAC CATHETERIZATION  01/30/2010  20-30% in-stent restenosis,60-70% ostial stenosis  . CORONARY ANGIOPLASTY WITH STENT PLACEMENT  11/13/2002   mid RCA  . KNEE SURGERY  2003  . LEFT HEART CATH AND CORONARY ANGIOGRAPHY N/A 09/05/2016   Procedure: Left Heart Cath and Coronary Angiography;  Surgeon: Kathleene Hazel, MD;  Location: [facility] INVASIVE CV LAB;  Service: Cardiovascular;  Laterality: N/A;  . LEFT HEART CATHETERIZATION WITH CORONARY ANGIOGRAM N/A 12/22/2012   Procedure: LEFT  HEART CATHETERIZATION WITH CORONARY ANGIOGRAM;  Surgeon: Thurmon Fair, MD;  Location: [facility] CATH LAB;  Service: Cardiovascular;  Laterality: N/A;  . NOSE SURGERY    . TOTAL KNEE ARTHROPLASTY Left 03/22/2017   Procedure: LEFT TOTAL KNEE ARTHROPLASTY;  Surgeon: Eugenia Mcalpine, MD;  Location: [facility] ORS;  Service: Orthopedics;  Laterality: Left;  [name] VASECTOMY       No outpatient medications have been marked as taking for the 11/06/18 encounter (Appointment) with Azalee Course, PA.     Allergies:   Patient has no known allergies.   Social History   Tobacco Use  . Smoking status: Never Smoker  . Smokeless tobacco: Never Used  Substance Use Topics  . Alcohol use: Yes    Alcohol/week: 1.0 standard drinks    Types: 1 Cans of beer per week    Comment: Weekly  . Drug use: No     Family Hx: The patient's family history includes Alzheimer's disease in his paternal grandfather; Cancer in his paternal grandfather; Heart failure in his father and maternal grandmother; Hypertension in his father; Stroke in his paternal grandmother.  ROS:   Please see the history of present illness.     All other systems reviewed and are negative.   Prior CV studies:   The following studies were reviewed today:  Cath 09/05/2016  Prox RCA to Dist RCA lesion, 20 %stenosed.  Ost Ramus lesion, 70 %stenosed.  Ost 1st Diag to 1st Diag lesion, 20 %stenosed.  Mid LAD to Dist LAD lesion, 20 %stenosed.  Prox LAD lesion, 20 %stenosed.  The left ventricular ejection fraction is 50-55% by visual estimate.  The left ventricular systolic function is normal.  LV end diastolic pressure is normal.  There is no mitral valve regurgitation.   1. Single vessel CAD with patent stents proximal/mid RCA with minimal stent restenosis 2. Mild non-obstructive disease in the LAD with segment of intramyocardial bridging in the mid to distal LAD 3. Small caliber intermediate branch with moderately severe ostial stenosis, unchanged from  cath in 2014.  4. Normal LV systolic function  Recommendations: Continue medical management of CAD.   Labs/Other Tests and Data Reviewed:    EKG:  An ECG dated 10/30/2018 was personally reviewed today and demonstrated:  Normal sinus rhythm with inferior infarct.  Recent Labs: 10/30/2018: BUN 12; Creatinine, Ser 0.97; Hemoglobin 14.3; Platelets 193; Potassium 3.9; Sodium 139   Recent Lipid Panel Lab Results  Component Value Date/Time   CHOL 137 04/08/2015 08:04 AM   TRIG 157 (H) 04/08/2015 08:04 AM   HDL 32 (L) 04/08/2015 08:04 AM   CHOLHDL 4.3 04/08/2015 08:04 AM   LDLCALC 74 04/08/2015 08:04 AM    Wt Readings from Last 3 Encounters:  10/30/18 290 lb (131.5 kg)  06/20/18 (!) 310 lb 12.8 oz (141 kg)  05/08/18 (!) 308 lb 3.2 oz (139.8 kg)     Objective:    Vital Signs:  There were no vitals taken for this visit.   VITAL SIGNS:  reviewed  ASSESSMENT & PLAN:    1. CAD with  stable angina: Despite recent hospital assessment suggested of atypical chest pain, the symptom he described now only occur with more strenuous activity but not with every day activity.  He is doing better on the 60 mg daily of Imdur.  I will continue on the current medication.  His current symptom is consistent with stable angina.  He is aware that if symptom worsens, increase in frequency or last longer, he will need to contact cardiology.  Otherwise I plan for reassessment in 1 month, if symptom worsens, will need to consider cardiac catheterization.  Previous cardiac catheterization was performed in early 2018 which only showed small vessel disease  2. Hypertension: No vital signs was available today  3. Hyperlipidemia: Continue statin  4. Obstructive sleep apnea: On CPAP.  COVID-19 Education: The signs and symptoms of COVID-19 were discussed with the patient and how to seek care for testing (follow up with PCP or arrange E-visit).  The importance of social distancing was discussed today.  Time:    Today, I have spent 7 minutes with the patient with telehealth technology discussing the above problems.     Medication Adjustments/Labs and Tests Ordered: Current medicines are reviewed at length with the patient today.  Concerns regarding medicines are outlined above.   Tests Ordered: No orders of the defined types were placed in this encounter.   Medication Changes: No orders of the defined types were placed in this encounter.   Disposition:  Follow up in 1 month(s)  Signed, Azalee Course, PA  11/06/2018 2:18 PM    [facility]

## 2018-12-18 ENCOUNTER — Telehealth: Payer: Self-pay | Admitting: Cardiovascular Disease

## 2018-12-18 MED ORDER — ISOSORBIDE MONONITRATE ER 30 MG PO TB24: 60.0000 mg | ORAL_TABLET | Freq: Every day | ORAL | 6 refills | Status: DC

## 2018-12-18 NOTE — Telephone Encounter (Signed)
Spoke to pharmacy at [facility] advised patient is taking Imdur 30 mg 2 tablets ( 60 mg ) daily.

## 2018-12-18 NOTE — Telephone Encounter (Signed)
° ° °  1. Name of Medication:  isosorbide mononitrate (IMDUR) 30 MG 24 hr tablet  2. How are you currently taking this medication (dosage and times per day)? 30mg  2x daily  3. Are you having a reaction (difficulty breathing--STAT)? No  4. What is your medication issue? Patient is almost out of medication.   The patient called the pharmacy asking for a refill of this medication, but the Insurance will not pay for it. The rx on file at the Pharmacy is written that the patient takes his medication 1x daily. Insurance needs updated rx to be sent to the pharmacy before they will pay for the medication

## 2018-12-18 NOTE — Telephone Encounter (Signed)
Follow Up   Rerouted to NL Triage

## 2018-12-29 ENCOUNTER — Telehealth: Payer: Self-pay | Admitting: *Deleted

## 2018-12-29 NOTE — Telephone Encounter (Signed)
The patient has been called concerning his appointment with Dr. Sallyanne Kuster on 12/31/2018. He stated that he would prefer an office visit. He has been made aware to wear a mask and that he would need to come alone.      COVID-19 Pre-Screening Questions:  . In the past 7 to 10 days have you had a cough,  shortness of breath, headache, congestion, fever (100 or greater) body aches, chills, sore throat, or sudden loss of taste or sense of smell? No . Have you been around anyone with known Covid 19. No . Have you been around anyone who is awaiting Covid 19 test results in the past 7 to 10 days? No . Have you been around anyone who has been exposed to Covid 19, or has mentioned symptoms of Covid 19 within the past 7 to 10 days? No  If you have any concerns/questions about symptoms patients report during screening (either on the phone or at threshold). Contact the provider seeing the patient or DOD for further guidance.  If neither are available contact a member of the leadership team.

## 2018-12-31 ENCOUNTER — Other Ambulatory Visit: Payer: Self-pay

## 2018-12-31 ENCOUNTER — Telehealth: Payer: 59 | Admitting: Cardiovascular Disease

## 2018-12-31 ENCOUNTER — Ambulatory Visit (INDEPENDENT_AMBULATORY_CARE_PROVIDER_SITE_OTHER): Payer: 59 | Admitting: Cardiovascular Disease

## 2018-12-31 ENCOUNTER — Encounter: Payer: Self-pay | Admitting: Cardiovascular Disease

## 2018-12-31 VITALS — BP 128/79 | HR 58 | Temp 97.2°F | Ht 73.0 in | Wt 299.4 lb

## 2018-12-31 DIAGNOSIS — E7800 Pure hypercholesterolemia, unspecified: Secondary | ICD-10-CM

## 2018-12-31 DIAGNOSIS — I10 Essential (primary) hypertension: Secondary | ICD-10-CM

## 2018-12-31 DIAGNOSIS — I25118 Atherosclerotic heart disease of native coronary artery with other forms of angina pectoris: Secondary | ICD-10-CM | POA: Diagnosis not present

## 2018-12-31 DIAGNOSIS — G4733 Obstructive sleep apnea (adult) (pediatric): Secondary | ICD-10-CM

## 2018-12-31 NOTE — Progress Notes (Signed)
Patient ID: Craig Copeland, male   DOB: 1956-03-26, 63 y.o.   MRN: [id number]     Cardiology Office Note   Date:  12/31/2018   ID:  Craig Copeland, DOB 1956-03-26, MRN [id number]  PCP:  Merri BrunettePharr, [name], MD  Cardiologist:   Thurmon FairMihai Izmael Duross, MD   No chief complaint on file.   History of Present Illness: Craig Copeland is a 63 y.o. male who presents for follow-up for coronary artery disease, hypertension and hyperlipidemia.  He was seen in the emergency room in late April with chest discomfort and his work-up was benign.  His isosorbide was increased to 30 mg twice daily. This has controlled his symptoms relatively well.  He seems to have CCS functional class II exertional angina.  He takes the first dose around 7:00 in the morning and feels that it wears off after 12 noon when he takes a second dose.  He continues to be very physically active.  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  He last underwent cardiac catheterization on September 05, 2016. The previously placed stents in his right coronary artery were widely patent and the only significant stenosis was an ostial 70% lesion in a relatively small caliber ramus intermedius artery. No revascularization was performed.  He seems to be tolerating pravastatin without problems. He was not tolerant of either rosuvastatin or atorvastatin.            He has a history of previous stent to the right coronary artery(2004, drug-eluting 3.0 x 33 mm Cypher stent overlapping 3.0 x 23 mm Cypher stent in the mid to proximal right coronary artery) and a moderate to severe ostial lesion of a small to medium ramus intermedius artery (too small for percutaneous revascularization). No change on cardiac cath performed in 2018. 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            His last  cardiac catheterization was in March 2018, that showed findings identical to 2014, 2011 and 2004. He has normal left ventricle systolic function by echo most recently performed in October 2016; EF 50-55% by LV angiography in March 2018.  History of hypertension and hyperlipidemia. He is morbidly obese. He is compliant with CPAP for obstructive sleep apnea. He is on chronic treatment with aspirin and clopidogrel.    Past Medical History:  Diagnosis Date  . CAD (coronary artery disease)   . Dizziness   . Dyslipidemia   . Flu 06/2016   Had again 01/208 with PNA  . Obesity   . OSA (obstructive sleep apnea)   . Systemic hypertension   . Vasospastic angina [facility](HCC)     Past Surgical History:  Procedure Laterality Date  . CARDIAC CATHETERIZATION  12/02/2002   <20% restenosis RCA  . CARDIAC CATHETERIZATION  01/30/2010   20-30% in-stent restenosis,60-70% ostial stenosis  . CORONARY ANGIOPLASTY WITH STENT PLACEMENT  11/13/2002   mid RCA  . KNEE SURGERY  2003  . LEFT HEART CATH AND CORONARY ANGIOGRAPHY N/A 09/05/2016   Procedure: Left Heart Cath and Coronary Angiography;  Surgeon: Kathleene Hazelhristopher D McAlhany, MD;  [facility];  Service: Cardiovascular;  Laterality: N/A;  . LEFT HEART CATHETERIZATION WITH CORONARY ANGIOGRAM N/A 12/22/2012   Procedure: LEFT HEART CATHETERIZATION WITH CORONARY ANGIOGRAM;  Surgeon: Thurmon FairMihai Hoover Grewe, MD;  Location: [facility] CATH LAB;  Service: Cardiovascular;  Laterality:  N/A;  . NOSE SURGERY    . TOTAL KNEE ARTHROPLASTY Left 03/22/2017   Procedure: LEFT TOTAL KNEE ARTHROPLASTY;  Surgeon: Eugenia Mcalpineollins, Robert, MD;  Location: [facility] ORS;  Service: Orthopedics;  Laterality: Left;  [name]. VASECTOMY       Current Outpatient Medications  Medication Sig Dispense Refill  . amLODipine (NORVASC) 5 MG tablet Take 1 tablet (5 mg total) by mouth every evening. 90 tablet 3  . aspirin EC 81 MG tablet Take 81 mg by mouth daily.    . cholecalciferol (VITAMIN D) 1000 units tablet Take 1,000  Units by mouth daily.    . clopidogrel (PLAVIX) 75 MG tablet Take 75 mg by mouth daily.    [name]. ezetimibe (ZETIA) 10 MG tablet Take 10 mg by mouth every evening.     . fish oil-omega-3 fatty acids 1000 MG capsule Take 1 g by mouth daily.     . isosorbide mononitrate (IMDUR) 30 MG 24 hr tablet Take 2 tablets (60 mg total) by mouth daily. 60 tablet 6  . losartan (COZAAR) 100 MG tablet Take 0.5 tablets (50 mg total) by mouth daily. 90 tablet 3  . nitroGLYCERIN (NITROSTAT) 0.4 MG SL tablet Place 1 tablet (0.4 mg total) under the tongue every 5 (five) minutes as needed for chest pain. NEED OV. 90 tablet 0  . pravastatin (PRAVACHOL) 80 MG tablet Take 80 mg by mouth at bedtime.      No current facility-administered medications for this visit.     Allergies:   Patient has no known allergies.    Social History:  The patient  reports that he has never smoked. He has never used smokeless tobacco. He reports current alcohol use of about 1.0 standard drinks of alcohol per week. He reports that he does not use drugs.   Family History:  The patient's family history includes Alzheimer's disease in his paternal grandfather; Cancer in his paternal grandfather; Heart failure in his father and maternal grandmother; Hypertension in his father; Stroke in his paternal grandmother.    ROS:  Please see the history of present illness.  He denies shortness of breath at rest or with activity, orthopnea, PND, claudication, focal neurological complaints.  All other systems are reviewed and negative.    PHYSICAL EXAM: VS:  BP 128/79   Pulse (!) 58   Temp (!) 97.2 F (36.2 C)   Ht 6\' 1"  (1.854 m)   Wt 299 lb 6.4 oz (135.8 kg)   SpO2 98%   BMI 39.50 kg/m  , BMI Body mass index is 39.5 kg/m.  General: Alert, oriented x3, no distress, obese Head: no evidence of trauma, PERRL, EOMI, no exophtalmos or lid lag, no myxedema, no xanthelasma; normal ears, nose and oropharynx Neck: normal jugular venous pulsations and no  hepatojugular reflux; brisk carotid pulses without delay and no carotid bruits Chest: clear to auscultation, no signs of consolidation by percussion or palpation, normal fremitus, symmetrical and full respiratory excursions Cardiovascular: normal position and quality of the apical impulse, regular rhythm, normal first and second heart sounds, no murmurs, rubs or gallops Abdomen: no tenderness or distention, no masses by palpation, no abnormal pulsatility or arterial bruits, normal bowel sounds, no hepatosplenomegaly Extremities: no clubbing, cyanosis; 2+ symmetrical ankle edema; 2+ radial, ulnar and brachial pulses bilaterally; 2+ right femoral, posterior tibial and dorsalis pedis pulses; 2+ left femoral, posterior tibial and dorsalis pedis pulses; no subclavian or femoral bruits Neurological: grossly nonfocal Psych: Normal mood and affect   EKG:  EKG is  ordered today.  It shows normal sinus rhythm old inferior wall MI, no acute repol changes  Recent Labs:  Lipid Panel     Component Value Date/Time   CHOL 137 04/08/2015 0804   TRIG 157 (H) 04/08/2015 0804   HDL 32 (L) 04/08/2015 0804   CHOLHDL 4.3 04/08/2015 0804   VLDL 31 (H) 04/08/2015 0804   LDLCALC 74 04/08/2015 0804    February 20, 2018 Total cholesterol 117, HDL 32, triglycerides 108, calculated LDL 67.  Wt Readings from Last 3 Encounters:  12/31/18 299 lb 6.4 oz (135.8 kg)  10/30/18 290 lb (131.5 kg)  06/20/18 (!) 310 lb 12.8 oz (141 kg)     ASSESSMENT AND PLAN:  1. Coronary artery disease of native artery of native heart with stable angina pectoris ([place])   2. Pure hypercholesterolemia   3. Essential hypertension   4. Morbid obesity ([place])   5. Obstructive sleep apnea      1. CAD s/p PCI RCA. In addition to fixed CAD, he is suspected of having vasospastic angina, and had a good response to long-acting nitrates on top of amlodipine.  The dose of amlodipine had to be reduced due to leg edema.  Slow heart rate precludes  use of beta blockers and would probably worsen his tendency to vasospasm.  On dual antiplatelet therapy and statin. 2. Hyperlipidemia. Did not tolerate more potent statins. Followed by Dr. Shelia Media.  LDL is in target range under 70 on combination pravastatin and ezetimibe.  Plans to have repeat lipid profile with Dr. Toy Cookey in August. 3. Hypertension well-controlled 4. Morbid obesity: Discussed the fact that losing weight should improve his anginal threshold. 5. OSA: Reports 100% compliance with CPAP.  He does not have daytime hypersomnolence.  Current medicines are reviewed at length with the patient today.   Labs/ tests ordered today include:   Orders Placed This Encounter  Procedures  . EKG 12-Lead    Patient Instructions  Medication Instructions:  Your physician recommends that you continue on your current medications as directed. Please refer to the Current Medication list given to you today.  If you need a refill on your cardiac medications before your next appointment, please call your pharmacy.   Lab work: None ordered.  Testing/Procedures: None ordered  Follow-Up: At [facility], you and your health needs are our priority.  As part of our continuing mission to provide you with exceptional heart care, we have created designated Provider Care Teams.  These Care Teams include your primary Cardiologist (physician) and Advanced Practice Providers (APPs -  Physician Assistants and Nurse Practitioners) who all work together to provide you with the care you need, when you need it. You will need a follow up appointment in 12 months.  Please call our office 2 months in advance to schedule this appointment.  You may see Sanda Klein, MD or one of the following Advanced Practice Providers on your designated Care Team: Almyra Deforest, PA-C Fabian Sharp, PA-C           Signed, Sanda Klein, MD  12/31/2018 2:56 PM    Sanda Klein, MD, [facility] [phone] office  [phone] pager

## 2018-12-31 NOTE — Patient Instructions (Signed)

## 2019-05-08 ENCOUNTER — Other Ambulatory Visit: Payer: Self-pay | Admitting: Cardiovascular Disease

## 2019-06-08 ENCOUNTER — Other Ambulatory Visit: Payer: Self-pay

## 2019-06-08 MED ORDER — NITROGLYCERIN 0.4 MG SL SUBL: 0.4000 mg | SUBLINGUAL_TABLET | SUBLINGUAL | 0 refills | Status: DC | PRN

## 2019-06-11 ENCOUNTER — Other Ambulatory Visit (HOSPITAL_COMMUNITY): Payer: Self-pay | Admitting: Specialist

## 2019-06-11 ENCOUNTER — Other Ambulatory Visit: Payer: Self-pay | Admitting: Specialist

## 2019-06-11 DIAGNOSIS — M25562 Pain in left knee: Secondary | ICD-10-CM

## 2019-06-12 ENCOUNTER — Encounter (HOSPITAL_COMMUNITY)
Admission: RE | Admit: 2019-06-12 | Discharge: 2019-06-12 | Disposition: A | Discharge status: Home / Self Care | Payer: 59 | Source: Ambulatory Visit | Attending: Specialist | Admitting: Specialist

## 2019-06-12 ENCOUNTER — Other Ambulatory Visit: Payer: Self-pay

## 2019-06-12 DIAGNOSIS — M25562 Pain in left knee: Secondary | ICD-10-CM | POA: Insufficient documentation

## 2019-06-12 MED ORDER — TECHNETIUM TC 99M MEDRONATE IV KIT
22.0000 | PACK | Freq: Once | INTRAVENOUS | Status: AC | PRN
  Administered 2019-06-12: 22 via INTRAVENOUS

## 2019-06-13 ENCOUNTER — Other Ambulatory Visit: Payer: Self-pay | Admitting: Cardiovascular Disease

## 2019-06-16 NOTE — Telephone Encounter (Signed)
Rx request sent to pharmacy.  

## 2019-07-17 ENCOUNTER — Other Ambulatory Visit: Payer: Self-pay

## 2019-07-17 ENCOUNTER — Encounter: Payer: Self-pay | Admitting: Physical Therapy

## 2019-07-17 ENCOUNTER — Ambulatory Visit: Payer: 59 | Attending: Orthopedic Surgery | Admitting: Physical Therapy

## 2019-07-17 DIAGNOSIS — G8929 Other chronic pain: Secondary | ICD-10-CM | POA: Insufficient documentation

## 2019-07-17 DIAGNOSIS — M25662 Stiffness of left knee, not elsewhere classified: Secondary | ICD-10-CM | POA: Insufficient documentation

## 2019-07-17 DIAGNOSIS — R600 Localized edema: Secondary | ICD-10-CM | POA: Diagnosis present

## 2019-07-17 DIAGNOSIS — M25562 Pain in left knee: Secondary | ICD-10-CM | POA: Insufficient documentation

## 2019-07-17 NOTE — Therapy (Signed)
[facility] [place], [place], [address] Phone: [phone]   Fax:  [phone]  Physical Therapy Evaluation  Patient Details  Name: Craig Copeland MRN: [id number] Date of Birth: 10-14-1955 Referring Provider (PT): Paralee Cancel MD   Encounter Date: 07/17/2019  PT End of Session - 07/17/19 0935    Visit Number  1    Number of Visits  12    Date for PT Re-Evaluation  08/14/19    PT Start Time  2035    PT Stop Time  0933    PT Time Calculation (min)  39 min    Activity Tolerance  Patient tolerated treatment well    Behavior During Therapy  [facility] for tasks assessed/performed       Past Medical History:  Diagnosis Date  . CAD (coronary artery disease)   . Dizziness   . Dyslipidemia   . Flu 06/2016   Had again 01/208 with PNA  . Obesity   . OSA (obstructive sleep apnea)   . Systemic hypertension   . Vasospastic angina [facility])     Past Surgical History:  Procedure Laterality Date  . CARDIAC CATHETERIZATION  12/02/2002   <20% restenosis RCA  . CARDIAC CATHETERIZATION  01/30/2010   20-30% in-stent restenosis,60-70% ostial stenosis  . CORONARY ANGIOPLASTY WITH STENT PLACEMENT  11/13/2002   mid RCA  . KNEE SURGERY  2003  . LEFT HEART CATH AND CORONARY ANGIOGRAPHY N/A 09/05/2016   Procedure: Left Heart Cath and Coronary Angiography;  Surgeon: Burnell Blanks, MD;  Location: [place] CV LAB;  Service: Cardiovascular;  Laterality: N/A;  . LEFT HEART CATHETERIZATION WITH CORONARY ANGIOGRAM N/A 12/22/2012   Procedure: LEFT HEART CATHETERIZATION WITH CORONARY ANGIOGRAM;  Surgeon: Sanda Klein, MD;  Location: [place] CATH LAB;  Service: Cardiovascular;  Laterality: N/A;  . NOSE SURGERY    . TOTAL KNEE ARTHROPLASTY Left 03/22/2017   Procedure: LEFT TOTAL KNEE ARTHROPLASTY;  Surgeon: Sydnee Cabal, MD;  [facility]: [facility];  Service: Orthopedics;  Laterality: Left;  [name] VASECTOMY      There were no vitals filed for this  visit.   Subjective Assessment - 07/17/19 1016    Subjective  COVID-19 screen performed prior to patient entering clinic.  The patient presents to the clinic today with c/o left knee pain.  He underwent a left total knee replacementon 03/22/17.  He states that a component may be loose.  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  These activites result in severe pain and a great deal of swelling.  He states he has done much over the last two days so he has no pain at the time of this evaluation.    Pertinent History  CAD, left TKA (03/22/17).    How long can you walk comfortably?  Variable.    Patient Stated Goals  Decrease pain and swelling and avoid surgery.    Currently in Pain?  No/denies         [facility] PT Assessment - 07/17/19 0001      Assessment   Medical Diagnosis  H/o left total knee replacement.    Referring Provider (PT)  Paralee Cancel MD    Onset Date/Surgical Date  --   Per pt:  '2002".     Precautions   Precautions  --   No ultrasound.     Restrictions   Weight Bearing Restrictions  No      Balance Screen   Has the patient fallen  in the past 6 months  Yes    How many times?  --   4.   Has the patient had a decrease in activity level because of a fear of falling?   Yes    Is the patient reluctant to leave their home because of a fear of falling?   No      Home Public house manager residence      Prior Function   Level of Independence  Independent      Observation/Other Assessments-Edema    Edema  Circumferential      Circumferential Edema   Circumferential - Right  LT 3.5 CMS > RT.      ROM / Strength   AROM / PROM / Strength  AROM;Strength      AROM   Overall AROM Comments  -10 to 110 degrees.      Strength   Overall Strength Comments  Left hip flexion, abduction, and left knee extension and flexion 5/5 via manual muscle testing.      Palpation   Palpation comment  Patient reports medial  distal femoral and proximal tibia pain during flare-ups but reports no palpable pain.      Ambulation/Gait   Gait Comments  Gait essentially normal this morning though he reports no pain at this time.                Objective measurements completed on examination: See above findings.      [facility] Adult PT Treatment/Exercise - 07/17/19 0001      Modalities   Modalities  Vasopneumatic      Vasopneumatic   Number Minutes Vasopneumatic   15 minutes    Vasopnuematic Location   --   Left knee.   Vasopneumatic Pressure  Low                  PT Long Term Goals - 07/17/19 1116      PT LONG TERM GOAL #1   Title  Independent with a HEP.    Time  4    Period  Weeks    Status  New      PT LONG TERM GOAL #2   Title  Full left active knee extension in order to normalize gait.    Time  4    Period  Weeks    Status  New      PT LONG TERM GOAL #3   Title  Active left knee flexion to 120 degrees+ so the patient can perform functional tasks and do so with pain not > 2-3/10.    Time  4    Period  Weeks    Status  New      PT LONG TERM GOAL #4   Title  Perform a reciprocating stair gait with one railing with pain not > 2-3/10.    Time  4    Period  Weeks    Status  New             Plan - 07/17/19 1025    Clinical Impression Statement  The patient presents to OPPT with c/o left knee pain and  swelling especially after performing hsi work related activities (he works in [employer]).  He states he may have a loose component.  His knee swells a great deal and hurts severely after/during work.  His active left knee range of motion is -10 to 110 degrees,  Patient will benefit from skilled physical therapy intervention to  address deficits and pain.    Personal Factors and Comorbidities  Comorbidity 1    Comorbidities  CAD, left TKA (03/22/17).    Examination-Activity Limitations  Squat;Stairs;Other    Examination-Participation Restrictions  Other     Stability/Clinical Decision Making  Evolving/Moderate complexity    Clinical Decision Making  Low    Rehab Potential  Good    PT Frequency  --   12 visits.   PT Treatment/Interventions  ADLs/Self Care Home Management;Cryotherapy;Electrical Stimulation;Moist Heat;Gait training;Stair training;Functional mobility training;Therapeutic activities;Therapeutic exercise;Neuromuscular re-education;Manual techniques;Patient/family education;Passive range of motion;Vasopneumatic Device    PT Next Visit Plan  Nustep, stationary bike, knee extension and hamstring curls.  Vasopneumatic PRN.    Consulted and Agree with Plan of Care  Patient       Patient will benefit from skilled therapeutic intervention in order to improve the following deficits and impairments:  Pain, Increased edema, Decreased activity tolerance, Decreased range of motion  Visit Diagnosis: Chronic pain of left knee - Plan: PT plan of care cert/re-cert  Stiffness of left knee, not elsewhere classified - Plan: PT plan of care cert/re-cert  Localized edema - Plan: PT plan of care cert/re-cert     Problem List Patient Active Problem List   Diagnosis Date Noted  . Osteoarthritis of right knee 03/22/2017  . S/P knee replacement 03/22/2017  . Chest pain with high risk for cardiac etiology 08/28/2016  . Murmur, cardiac 04/01/2015  . Angina pectoris syndrome (HCC) 12/18/2012  . CAD (coronary artery disease) 12/18/2012  . Hyperlipidemia 12/18/2012  . Essential hypertension 12/18/2012  . Morbid obesity (HCC) 12/18/2012  . Obstructive sleep apnea 12/18/2012    Craig Copeland, [place] MPT 07/17/2019, 12:37 PM  [facility] [address] [place], [place], [address] Phone: [phone]   Fax:  [phone]  Name: Craig Copeland MRN: [id number] Date of Birth: [birth year]

## 2019-07-21 ENCOUNTER — Other Ambulatory Visit: Payer: Self-pay

## 2019-07-21 ENCOUNTER — Ambulatory Visit: Payer: 59 | Admitting: *Deleted

## 2019-07-21 DIAGNOSIS — R600 Localized edema: Secondary | ICD-10-CM

## 2019-07-21 DIAGNOSIS — G8929 Other chronic pain: Secondary | ICD-10-CM

## 2019-07-21 DIAGNOSIS — M25562 Pain in left knee: Secondary | ICD-10-CM

## 2019-07-21 DIAGNOSIS — M25662 Stiffness of left knee, not elsewhere classified: Secondary | ICD-10-CM

## 2019-07-21 NOTE — Therapy (Signed)
[facility] [place], [place], [address] Phone: [phone]   Fax:  [phone]  Physical Therapy Treatment  Patient Details  Name: Craig Copeland MRN: [id number] Date of Birth: 11-10-1955 Referring Provider (PT): Paralee Cancel MD   Encounter Date: 07/21/2019  PT End of Session - 07/21/19 0906    Visit Number  2    Number of Visits  12    Date for PT Re-Evaluation  08/14/19    PT Start Time  0815    PT Stop Time  0911    PT Time Calculation (min)  56 min       Past Medical History:  Diagnosis Date  . CAD (coronary artery disease)   . Dizziness   . Dyslipidemia   . Flu 06/2016   Had again 01/208 with PNA  . Obesity   . OSA (obstructive sleep apnea)   . Systemic hypertension   . Vasospastic angina [facility])     Past Surgical History:  Procedure Laterality Date  . CARDIAC CATHETERIZATION  12/02/2002   <20% restenosis RCA  . CARDIAC CATHETERIZATION  01/30/2010   20-30% in-stent restenosis,60-70% ostial stenosis  . CORONARY ANGIOPLASTY WITH STENT PLACEMENT  11/13/2002   mid RCA  . KNEE SURGERY  2003  . LEFT HEART CATH AND CORONARY ANGIOGRAPHY N/A 09/05/2016   Procedure: Left Heart Cath and Coronary Angiography;  Surgeon: Burnell Blanks, MD;  Location: [place] CV LAB;  Service: Cardiovascular;  Laterality: N/A;  . LEFT HEART CATHETERIZATION WITH CORONARY ANGIOGRAM N/A 12/22/2012   Procedure: LEFT HEART CATHETERIZATION WITH CORONARY ANGIOGRAM;  Surgeon: Sanda Klein, MD;  Location: [place] CATH LAB;  Service: Cardiovascular;  Laterality: N/A;  . NOSE SURGERY    . TOTAL KNEE ARTHROPLASTY Left 03/22/2017   Procedure: LEFT TOTAL KNEE ARTHROPLASTY;  Surgeon: Sydnee Cabal, MD;  [facility];  Service: Orthopedics;  Laterality: Left;  [name] VASECTOMY      There were no vitals filed for this visit.  Subjective Assessment - 07/21/19 0804    Subjective  COVID-19 screen performed prior to patient entering clinic.    Pertinent History  CAD, left TKA (03/22/17).    How long can you walk comfortably?  Variable.    Patient Stated Goals  Decrease pain and swelling and avoid surgery.                       [facility] Adult PT Treatment/Exercise - 07/21/19 0001      Exercises   Exercises  Knee/Hip      Knee/Hip Exercises: Aerobic   Recumbent Bike  seat 10 L1,2 x 5 mins    Nustep  Seat 10 L2-3 x 15 mins      Knee/Hip Exercises: Machines for Strengthening   Cybex Knee Extension  10#  3x fatigue pause at top 3-5 secs    Cybex Knee Flexion  30#   3x fatigue      Knee/Hip Exercises: Standing   Rocker Board  3 minutes   calf stretching     Modalities   Modalities  Vasopneumatic      Vasopneumatic   Number Minutes Vasopneumatic   15 minutes    Vasopnuematic Location   --   Left knee.   Vasopneumatic Pressure  Low    Vasopneumatic Temperature   36                  PT Long Term Goals - 07/17/19 1116  PT LONG TERM GOAL #1   Title  Independent with a HEP.    Time  4    Period  Weeks    Status  New      PT LONG TERM GOAL #2   Title  Full left active knee extension in order to normalize gait.    Time  4    Period  Weeks    Status  New      PT LONG TERM GOAL #3   Title  Active left knee flexion to 120 degrees+ so the patient can perform functional tasks and do so with pain not > 2-3/10.    Time  4    Period  Weeks    Status  New      PT LONG TERM GOAL #4   Title  Perform a reciprocating stair gait with one railing with pain not > 2-3/10.    Time  4    Period  Weeks    Status  New            Plan - 07/21/19 0805    Clinical Impression Statement  Pt arrived today doing fairly well with minimal LT knee pain. He was guided through strengthening exs for LT LE with mainly fatigue end of session and normal Vaso response as well.    Personal Factors and Comorbidities  Comorbidity 1    Comorbidities  CAD, left TKA (03/22/17).    Examination-Activity Limitations   Squat;Stairs;Other    Examination-Participation Restrictions  Other    Stability/Clinical Decision Making  Evolving/Moderate complexity    Rehab Potential  Good    PT Treatment/Interventions  ADLs/Self Care Home Management;Cryotherapy;Electrical Stimulation;Moist Heat;Gait training;Stair training;Functional mobility training;Therapeutic activities;Therapeutic exercise;Neuromuscular re-education;Manual techniques;Patient/family education;Passive range of motion;Vasopneumatic Device    PT Next Visit Plan  Nustep, stationary bike, knee extension and hamstring curls.  Vasopneumatic PRN.    Consulted and Agree with Plan of Care  Patient       Patient will benefit from skilled therapeutic intervention in order to improve the following deficits and impairments:  Pain, Increased edema, Decreased activity tolerance, Decreased range of motion  Visit Diagnosis: Chronic pain of left knee  Stiffness of left knee, not elsewhere classified  Localized edema     Problem List Patient Active Problem List   Diagnosis Date Noted  . Osteoarthritis of right knee 03/22/2017  . S/P knee replacement 03/22/2017  . Chest pain with high risk for cardiac etiology 08/28/2016  . Murmur, cardiac 04/01/2015  . Angina pectoris syndrome (HCC) 12/18/2012  . CAD (coronary artery disease) 12/18/2012  . Hyperlipidemia 12/18/2012  . Essential hypertension 12/18/2012  . Morbid obesity (HCC) 12/18/2012  . Obstructive sleep apnea 12/18/2012    Arasely Akkerman,[name], PTA 07/21/2019, 9:59 AM  [facility] [address] [place], [place], [address] Phone: [phone]   Fax:  [phone]  Name: Craig Copeland MRN: [id number] Date of Birth: 1955-10-04

## 2019-07-23 ENCOUNTER — Encounter: Payer: Self-pay | Admitting: Physical Therapy

## 2019-07-23 ENCOUNTER — Ambulatory Visit: Payer: 59 | Admitting: Physical Therapy

## 2019-07-23 ENCOUNTER — Other Ambulatory Visit: Payer: Self-pay

## 2019-07-23 DIAGNOSIS — M25562 Pain in left knee: Secondary | ICD-10-CM

## 2019-07-23 DIAGNOSIS — G8929 Other chronic pain: Secondary | ICD-10-CM

## 2019-07-23 DIAGNOSIS — R600 Localized edema: Secondary | ICD-10-CM

## 2019-07-23 DIAGNOSIS — M25662 Stiffness of left knee, not elsewhere classified: Secondary | ICD-10-CM

## 2019-07-23 NOTE — Patient Instructions (Signed)
    Bridging   Slowly raise buttocks from floor, keeping stomach tight. Repeat _10___ times per set. Do __2__ sets per session. Do __2__ sessions per day.   Straight Leg Raise   Tighten stomach and slowly raise locked right leg __4__ inches from floor. Repeat __10-30__ times per set. Do __2__ sets per session. Do __2__ sessions per day.    Strengthening: Hip Abduction (Side-Lying)   Tighten muscles on front of left thigh, then lift leg __5__ inches from surface, keeping knee locked.  Repeat __10__ times per set. Do __2__ sets per session. Do _2___ sessions per day.      Terminal Knee Extension (Standing)    Facing anchor with right knee slightly bent and tubing just above knee, gently pull knee back straight. Do not overextend knee. Repeat _10___ times per set. Do __1-3__ sets per session. Do _1-2___ sessions per day.

## 2019-07-23 NOTE — Therapy (Signed)
[facility] [address] [place], [place], [address] Phone: [phone]   Fax:  [phone]  Physical Therapy Treatment  Patient Details  Name: Craig Copeland MRN: [id number] Date of Birth: 1955/10/10 Referring Provider (PT): Durene Romans MD   Encounter Date: 07/23/2019  PT End of Session - 07/23/19 0857    Visit Number  3    Number of Visits  12    Date for PT Re-Evaluation  08/14/19    PT Start Time  0814    PT Stop Time  0913    PT Time Calculation (min)  59 min    Activity Tolerance  Patient tolerated treatment well    Behavior During Therapy  [facility] for tasks assessed/performed       Past Medical History:  Diagnosis Date  . CAD (coronary artery disease)   . Dizziness   . Dyslipidemia   . Flu 06/2016   Had again 01/208 with PNA  . Obesity   . OSA (obstructive sleep apnea)   . Systemic hypertension   . Vasospastic angina [facility])     Past Surgical History:  Procedure Laterality Date  . CARDIAC CATHETERIZATION  12/02/2002   <20% restenosis RCA  . CARDIAC CATHETERIZATION  01/30/2010   20-30% in-stent restenosis,60-70% ostial stenosis  . CORONARY ANGIOPLASTY WITH STENT PLACEMENT  11/13/2002   mid RCA  . KNEE SURGERY  2003  . LEFT HEART CATH AND CORONARY ANGIOGRAPHY N/A 09/05/2016   Procedure: Left Heart Cath and Coronary Angiography;  Surgeon: Kathleene Hazel, MD;  Location: [facility] INVASIVE CV LAB;  Service: Cardiovascular;  Laterality: N/A;  . LEFT HEART CATHETERIZATION WITH CORONARY ANGIOGRAM N/A 12/22/2012   Procedure: LEFT HEART CATHETERIZATION WITH CORONARY ANGIOGRAM;  Surgeon: Thurmon Fair, MD;  Location: [facility];  Service: Cardiovascular;  Laterality: N/A;  . NOSE SURGERY    . TOTAL KNEE ARTHROPLASTY Left 03/22/2017   Procedure: LEFT TOTAL KNEE ARTHROPLASTY;  Surgeon: Eugenia Mcalpine, MD;  [facility];  Service: Orthopedics;  Laterality: Left;  [name] VASECTOMY      There were no vitals filed for this  visit.  Subjective Assessment - 07/23/19 0818    Subjective  COVID-19 screen performed prior to patient entering clinic. No pain upon arrival today, patient reported pain usually at end of day after being weightbearing for prolong time or climbing latdders.    Pertinent History  CAD, left TKA (03/22/17).    How long can you walk comfortably?  Variable.    Patient Stated Goals  Decrease pain and swelling and avoid surgery.    Currently in Pain?  No/denies         [facility] PT Assessment - 07/23/19 0001      ROM / Strength   AROM / PROM / Strength  AROM;PROM      AROM   AROM Assessment Site  Knee    Right/Left Knee  Left    Left Knee Extension  -8    Left Knee Flexion  100      PROM   PROM Assessment Site  Knee    Right/Left Knee  Left    Left Knee Extension  -4    Left Knee Flexion  110                   [facility] Adult PT Treatment/Exercise - 07/23/19 0001      Knee/Hip Exercises: Aerobic   Nustep  L3 UE/LE activities      Knee/Hip Exercises: Machines  for Strengthening   Cybex Knee Extension  10# x10min pause at top 3-5 secs    Cybex Knee Flexion  30# x77min      Knee/Hip Exercises: Standing   Theraband Level (Terminal Knee Extension)  Level 2 (Red)    Terminal Knee Extension Limitations  x20    Forward Step Up  Left;20 reps;Hand Hold: 0;Step Height: 6"    Step Down  Left;2 sets;10 reps;Step Height: 4";Hand Hold: 0    Rocker Board  3 minutes      Vasopneumatic   Number Minutes Vasopneumatic   15 minutes    Vasopnuematic Location   Knee   left   Vasopneumatic Pressure  Low    Vasopneumatic Temperature   34      Manual Therapy   Manual Therapy  Passive ROM    Passive ROM  manual PROM for left knee flexion/ext with low load holds to improve mobility             PT Education - 07/23/19 0902    Education Details  HEP    Person(s) Educated  Patient    Methods  Explanation;Demonstration;Handout    Comprehension  Verbalized understanding;Returned  demonstration          PT Long Term Goals - 07/23/19 0865      PT LONG TERM GOAL #1   Title  Independent with a HEP.    Time  4    Period  Weeks    Status  On-going      PT LONG TERM GOAL #2   Title  Full left active knee extension in order to normalize gait.    Time  4    Period  Weeks    Status  On-going      PT LONG TERM GOAL #3   Title  Active left knee flexion to 120 degrees+ so the patient can perform functional tasks and do so with pain not > 2-3/10.    Time  4    Period  Weeks    Status  On-going      PT LONG TERM GOAL #4   Title  Perform a reciprocating stair gait with one railing with pain not > 2-3/10.    Time  4    Period  Weeks    Status  On-going            Plan - 07/23/19 7846    Clinical Impression Statement  Patient tolerated treatment well today. Patient reported only soreness with exercises yet no pain. Patient has more pain with prolong weight bearing and climning ladder. Educated patient on HEP for home progression and technique with going up/down stairs and ladder to avoid increased pain in knee at this time. Today patient able to progress with exercises to strengthen left knee and started PROM to improve flexion and ext yet had some swelling which restricted ROM. Goals ongoing at this time.    Personal Factors and Comorbidities  Comorbidity 1    Comorbidities  CAD, left TKA (03/22/17).    Examination-Activity Limitations  Squat;Stairs;Other    Examination-Participation Restrictions  Other    Stability/Clinical Decision Making  Evolving/Moderate complexity    Rehab Potential  Good    PT Treatment/Interventions  ADLs/Self Care Home Management;Cryotherapy;Electrical Stimulation;Moist Heat;Gait training;Stair training;Functional mobility training;Therapeutic activities;Therapeutic exercise;Neuromuscular re-education;Manual techniques;Patient/family education;Passive range of motion;Vasopneumatic Device    PT Next Visit Plan  cont with POC for  strengthening and  Vasopneumatic PRN.    Consulted and Agree with Plan  of Care  Patient       Patient will benefit from skilled therapeutic intervention in order to improve the following deficits and impairments:  Pain, Increased edema, Decreased activity tolerance, Decreased range of motion  Visit Diagnosis: Chronic pain of left knee  Stiffness of left knee, not elsewhere classified  Localized edema     Problem List Patient Active Problem List   Diagnosis Date Noted  . Osteoarthritis of right knee 03/22/2017  . S/P knee replacement 03/22/2017  . Chest pain with high risk for cardiac etiology 08/28/2016  . Murmur, cardiac 04/01/2015  . Angina pectoris syndrome (HCC) 12/18/2012  . CAD (coronary artery disease) 12/18/2012  . Hyperlipidemia 12/18/2012  . Essential hypertension 12/18/2012  . Morbid obesity (HCC) 12/18/2012  . Obstructive sleep apnea 12/18/2012    Hermelinda Dellen, PTA 07/23/2019, 9:39 AM  [facility] [address] [place], [place], [address] Phone: [phone]   Fax:  [phone]  Name: JAYCEION LISENBY MRN: [id number] Date of Birth: 21-Jun-1956

## 2019-07-28 ENCOUNTER — Ambulatory Visit: Payer: 59 | Admitting: *Deleted

## 2019-07-28 ENCOUNTER — Other Ambulatory Visit: Payer: Self-pay

## 2019-07-28 DIAGNOSIS — M25562 Pain in left knee: Secondary | ICD-10-CM

## 2019-07-28 DIAGNOSIS — G8929 Other chronic pain: Secondary | ICD-10-CM

## 2019-07-28 DIAGNOSIS — M25662 Stiffness of left knee, not elsewhere classified: Secondary | ICD-10-CM

## 2019-07-28 DIAGNOSIS — R600 Localized edema: Secondary | ICD-10-CM

## 2019-07-28 NOTE — Therapy (Signed)
[facility] [place], [place], [address] Phone: [phone]   Fax:  [phone]  Physical Therapy Treatment  Patient Details  Name: Craig Copeland MRN: [id number] Date of Birth: 07/03/1955 Referring Provider (PT): Paralee Cancel MD   Encounter Date: 07/28/2019  PT End of Session - 07/28/19 0840    Visit Number  4    Number of Visits  12    Date for PT Re-Evaluation  08/14/19    PT Start Time  0815    PT Stop Time  0911    PT Time Calculation (min)  56 min       Past Medical History:  Diagnosis Date  . CAD (coronary artery disease)   . Dizziness   . Dyslipidemia   . Flu 06/2016   Had again 01/208 with PNA  . Obesity   . OSA (obstructive sleep apnea)   . Systemic hypertension   . Vasospastic angina [facility])     Past Surgical History:  Procedure Laterality Date  . CARDIAC CATHETERIZATION  12/02/2002   <20% restenosis RCA  . CARDIAC CATHETERIZATION  01/30/2010   20-30% in-stent restenosis,60-70% ostial stenosis  . CORONARY ANGIOPLASTY WITH STENT PLACEMENT  11/13/2002   mid RCA  . KNEE SURGERY  2003  . LEFT HEART CATH AND CORONARY ANGIOGRAPHY N/A 09/05/2016   Procedure: Left Heart Cath and Coronary Angiography;  Surgeon: Burnell Blanks, MD;  Location: [place] [facility];  Service: Cardiovascular;  Laterality: N/A;  . LEFT HEART CATHETERIZATION WITH CORONARY ANGIOGRAM N/A 12/22/2012   Procedure: LEFT HEART CATHETERIZATION WITH CORONARY ANGIOGRAM;  Surgeon: Sanda Klein, MD;  Location: [place] CATH LAB;  Service: Cardiovascular;  Laterality: N/A;  . NOSE SURGERY    . TOTAL KNEE ARTHROPLASTY Left 03/22/2017   Procedure: LEFT TOTAL KNEE ARTHROPLASTY;  Surgeon: Sydnee Cabal, MD;  [facility];  Service: Orthopedics;  Laterality: Left;  [name] VASECTOMY      There were no vitals filed for this visit.  Subjective Assessment - 07/28/19 0834    Subjective  COVID-19 screen performed prior to patient entering clinic.  Did  ok  after last Rx. I use knee pads when kneeling    Pertinent History  CAD, left TKA (03/22/17).    How long can you walk comfortably?  Variable.    Patient Stated Goals  Decrease pain and swelling and avoid surgery.    Currently in Pain?  No/denies   usually afternoon pain 7-8/10                      [facility] Adult PT Treatment/Exercise - 07/28/19 0001      Exercises   Exercises  Knee/Hip      Knee/Hip Exercises: Aerobic   Nustep  45min L4 UE/LE activities   PRN UE assist as needed     Knee/Hip Exercises: Machines for Strengthening   Cybex Knee Extension  10# x64min pause at top 3-5 secs    Cybex Knee Flexion  40# x49min   slow  controlled     Knee/Hip Exercises: Standing   Forward Step Up  Left;Hand Hold: 0;Step Height: 6";3 sets;10 reps    Step Down  Left;2 sets;10 reps;Hand Hold: 0;Step Height: 6"    Rocker Board  4 minutes   calf stretching     Modalities   Modalities  Vasopneumatic      Vasopneumatic   Number Minutes Vasopneumatic   15 minutes    Vasopnuematic Location   Knee  Vasopneumatic Pressure  Low    Vasopneumatic Temperature   34                  PT Long Term Goals - 07/23/19 8921      PT LONG TERM GOAL #1   Title  Independent with a HEP.    Time  4    Period  Weeks    Status  On-going      PT LONG TERM GOAL #2   Title  Full left active knee extension in order to normalize gait.    Time  4    Period  Weeks    Status  On-going      PT LONG TERM GOAL #3   Title  Active left knee flexion to 120 degrees+ so the patient can perform functional tasks and do so with pain not > 2-3/10.    Time  4    Period  Weeks    Status  On-going      PT LONG TERM GOAL #4   Title  Perform a reciprocating stair gait with one railing with pain not > 2-3/10.    Time  4    Period  Weeks    Status  On-going            Plan - 07/28/19 1941    Clinical Impression Statement  Pt arrived today doing fairly well and feels that Rx are helpin  some, but pain still when kneeling alot. Pt was guided through strengthening and some stretching exs with mainly fatigue and some anterior knee pain.  Normal vaso response today    Comorbidities  CAD, left TKA (03/22/17).    Stability/Clinical Decision Making  Evolving/Moderate complexity    Rehab Potential  Good    PT Treatment/Interventions  ADLs/Self Care Home Management;Cryotherapy;Electrical Stimulation;Moist Heat;Gait training;Stair training;Functional mobility training;Therapeutic activities;Therapeutic exercise;Neuromuscular re-education;Manual techniques;Patient/family education;Passive range of motion;Vasopneumatic Device    PT Next Visit Plan  cont with POC for strengthening and  Vasopneumatic PRN.    Consulted and Agree with Plan of Care  Patient       Patient will benefit from skilled therapeutic intervention in order to improve the following deficits and impairments:     Visit Diagnosis: Chronic pain of left knee  Stiffness of left knee, not elsewhere classified  Localized edema     Problem List Patient Active Problem List   Diagnosis Date Noted  . Osteoarthritis of right knee 03/22/2017  . S/P knee replacement 03/22/2017  . Chest pain with high risk for cardiac etiology 08/28/2016  . Murmur, cardiac 04/01/2015  . Angina pectoris syndrome (HCC) 12/18/2012  . CAD (coronary artery disease) 12/18/2012  . Hyperlipidemia 12/18/2012  . Essential hypertension 12/18/2012  . Morbid obesity (HCC) 12/18/2012  . Obstructive sleep apnea 12/18/2012    Salihah Peckham,[name], PTA 07/28/2019, 10:25 AM  [facility] [address] [place], [place], [address] Phone: [phone]   Fax:  [phone]  Name: Craig Copeland MRN: [id number] Date of Birth: 20-Mar-1956

## 2019-07-30 ENCOUNTER — Other Ambulatory Visit: Payer: Self-pay

## 2019-07-30 ENCOUNTER — Ambulatory Visit: Payer: 59 | Admitting: Physical Therapy

## 2019-07-30 ENCOUNTER — Encounter: Payer: Self-pay | Admitting: Physical Therapy

## 2019-07-30 DIAGNOSIS — R600 Localized edema: Secondary | ICD-10-CM

## 2019-07-30 DIAGNOSIS — M25562 Pain in left knee: Secondary | ICD-10-CM | POA: Diagnosis not present

## 2019-07-30 DIAGNOSIS — M25662 Stiffness of left knee, not elsewhere classified: Secondary | ICD-10-CM

## 2019-07-30 DIAGNOSIS — G8929 Other chronic pain: Secondary | ICD-10-CM

## 2019-07-30 NOTE — Therapy (Signed)
[facility] [place], [place], [address] Phone: [phone]   Fax:  [phone]  Physical Therapy Treatment  Patient Details  Name: Craig Copeland MRN: [id number] Date of Birth: 01-23-1956 Referring Provider (PT): Paralee Cancel MD   Encounter Date: 07/30/2019  PT End of Session - 07/30/19 0850    Visit Number  5    Number of Visits  12    Date for PT Re-Evaluation  08/14/19    PT Start Time  0815    PT Stop Time  0912    PT Time Calculation (min)  57 min    Activity Tolerance  Patient tolerated treatment well    Behavior During Therapy  [facility] for tasks assessed/performed       Past Medical History:  Diagnosis Date  . CAD (coronary artery disease)   . Dizziness   . Dyslipidemia   . Flu 06/2016   Had again 01/208 with PNA  . Obesity   . OSA (obstructive sleep apnea)   . Systemic hypertension   . Vasospastic angina [facility])     Past Surgical History:  Procedure Laterality Date  . CARDIAC CATHETERIZATION  12/02/2002   <20% restenosis RCA  . CARDIAC CATHETERIZATION  01/30/2010   20-30% in-stent restenosis,60-70% ostial stenosis  . CORONARY ANGIOPLASTY WITH STENT PLACEMENT  11/13/2002   mid RCA  . KNEE SURGERY  2003  . LEFT HEART CATH AND CORONARY ANGIOGRAPHY N/A 09/05/2016   Procedure: Left Heart Cath and Coronary Angiography;  Surgeon: Burnell Blanks, MD;  Location: [place] [facility];  Service: Cardiovascular;  Laterality: N/A;  . LEFT HEART CATHETERIZATION WITH CORONARY ANGIOGRAM N/A 12/22/2012   Procedure: LEFT HEART CATHETERIZATION WITH CORONARY ANGIOGRAM;  Surgeon: Sanda Klein, MD;  Location: [place] CATH LAB;  Service: Cardiovascular;  Laterality: N/A;  . NOSE SURGERY    . TOTAL KNEE ARTHROPLASTY Left 03/22/2017   Procedure: LEFT TOTAL KNEE ARTHROPLASTY;  Surgeon: Sydnee Cabal, MD;  [facility];  Service: Orthopedics;  Laterality: Left;  [name] VASECTOMY      There were no vitals filed for this  visit.  Subjective Assessment - 07/30/19 0818    Subjective  COVID-19 screen performed prior to patient entering clinic.  Patient arrived with no pain, pain usually is in evening. Patient has stopped anti innflammatory meds at this time    Pertinent History  CAD, left TKA (03/22/17).    How long can you walk comfortably?  Variable.    Patient Stated Goals  Decrease pain and swelling and avoid surgery.    Currently in Pain?  No/denies                       [facility] Adult PT Treatment/Exercise - 07/30/19 0001      Knee/Hip Exercises: Aerobic   Nustep  29min L2-4 UE/LE      Knee/Hip Exercises: Machines for Strengthening   Cybex Knee Extension  10# x75min pause at top 3-5 secs    Cybex Knee Flexion  40# x30min slow pace      Knee/Hip Exercises: Standing   Theraband Level (Terminal Knee Extension)  Other (comment)    Terminal Knee Extension Limitations  x30 with blue XTS    Forward Step Up  Left;Hand Hold: 0;Step Height: 6";3 sets;10 reps    Step Down  Left;2 sets;10 reps;Hand Hold: 0;Step Height: 6"    Rocker Board  3 minutes      Knee/Hip Exercises: Supine   [employer]  with Clamshell  Strengthening;Both;20 reps   green t-band     Vasopneumatic   Number Minutes Vasopneumatic   15 minutes    Vasopnuematic Location   Knee    Vasopneumatic Pressure  Low    Vasopneumatic Temperature   34 for edema                  PT Long Term Goals - 07/30/19 0846      PT LONG TERM GOAL #1   Title  Independent with a HEP.    Time  4    Period  Weeks    Status  On-going      PT LONG TERM GOAL #2   Title  Full left active knee extension in order to normalize gait.    Time  4    Period  Weeks    Status  On-going      PT LONG TERM GOAL #3   Title  Active left knee flexion to 120 degrees+ so the patient can perform functional tasks and do so with pain not > 2-3/10.    Time  4    Period  Weeks    Status  On-going      PT LONG TERM GOAL #4   Title  Perform a  reciprocating stair gait with one railing with pain not > 2-3/10.    Time  4    Period  Weeks    Status  On-going            Plan - 07/30/19 0847    Clinical Impression Statement  Patient tolerated treatment well today. Patient able to progress with exercises with no increased discomfort. Patient has more pain at the end of day or after work activities. Patient has ongoing limitations with ROM, pain and strength. goals progressing at this time.    Personal Factors and Comorbidities  Comorbidity 1    Comorbidities  CAD, left TKA (03/22/17).    Examination-Activity Limitations  Squat;Stairs;Other    Examination-Participation Restrictions  Other    Stability/Clinical Decision Making  Evolving/Moderate complexity    Rehab Potential  Good    PT Treatment/Interventions  ADLs/Self Care Home Management;Cryotherapy;Electrical Stimulation;Moist Heat;Gait training;Stair training;Functional mobility training;Therapeutic activities;Therapeutic exercise;Neuromuscular re-education;Manual techniques;Patient/family education;Passive range of motion;Vasopneumatic Device    PT Next Visit Plan  cont with POC for strengthening, ROM and  Vasopneumatic PRN.    Consulted and Agree with Plan of Care  Patient       Patient will benefit from skilled therapeutic intervention in order to improve the following deficits and impairments:  Pain, Increased edema, Decreased activity tolerance, Decreased range of motion  Visit Diagnosis: Chronic pain of left knee  Stiffness of left knee, not elsewhere classified  Localized edema     Problem List Patient Active Problem List   Diagnosis Date Noted  . Osteoarthritis of right knee 03/22/2017  . S/P knee replacement 03/22/2017  . Chest pain with high risk for cardiac etiology 08/28/2016  . Murmur, cardiac 04/01/2015  . Angina pectoris syndrome (HCC) 12/18/2012  . CAD (coronary artery disease) 12/18/2012  . Hyperlipidemia 12/18/2012  . Essential hypertension  12/18/2012  . Morbid obesity (HCC) 12/18/2012  . Obstructive sleep apnea 12/18/2012    Hermelinda Dellen, PTA 07/30/2019, 9:12 AM  [facility] [address] [place], [place], [address] Phone: [phone]   Fax:  [phone]  Name: Craig Copeland MRN: [id number] Date of Birth: 1956-06-23

## 2019-08-04 ENCOUNTER — Ambulatory Visit: Payer: 59 | Attending: Orthopedic Surgery | Admitting: Physical Therapy

## 2019-08-04 ENCOUNTER — Other Ambulatory Visit: Payer: Self-pay

## 2019-08-04 ENCOUNTER — Encounter: Payer: Self-pay | Admitting: Physical Therapy

## 2019-08-04 DIAGNOSIS — M25562 Pain in left knee: Secondary | ICD-10-CM | POA: Insufficient documentation

## 2019-08-04 DIAGNOSIS — R600 Localized edema: Secondary | ICD-10-CM | POA: Diagnosis present

## 2019-08-04 DIAGNOSIS — G8929 Other chronic pain: Secondary | ICD-10-CM

## 2019-08-04 DIAGNOSIS — M25662 Stiffness of left knee, not elsewhere classified: Secondary | ICD-10-CM | POA: Diagnosis present

## 2019-08-04 NOTE — Therapy (Signed)
[facility] [place], [place], [address] Phone: [phone]   Fax:  [phone]  Physical Therapy Treatment  Patient Details  Name: Craig Copeland MRN: [id number] Date of Birth: 1956/06/26 Referring Provider (PT): Paralee Cancel MD   Encounter Date: 08/04/2019  PT End of Session - 08/04/19 0817    Visit Number  6    Number of Visits  12    Date for PT Re-Evaluation  08/14/19    PT Start Time  0817    PT Stop Time  0905    PT Time Calculation (min)  48 min    Activity Tolerance  Patient tolerated treatment well    Behavior During Therapy  [facility] for tasks assessed/performed       Past Medical History:  Diagnosis Date  . CAD (coronary artery disease)   . Dizziness   . Dyslipidemia   . Flu 06/2016   Had again 01/208 with PNA  . Obesity   . OSA (obstructive sleep apnea)   . Systemic hypertension   . Vasospastic angina [facility])     Past Surgical History:  Procedure Laterality Date  . CARDIAC CATHETERIZATION  12/02/2002   <20% restenosis RCA  . CARDIAC CATHETERIZATION  01/30/2010   20-30% in-stent restenosis,60-70% ostial stenosis  . CORONARY ANGIOPLASTY WITH STENT PLACEMENT  11/13/2002   mid RCA  . KNEE SURGERY  2003  . LEFT HEART CATH AND CORONARY ANGIOGRAPHY N/A 09/05/2016   Procedure: Left Heart Cath and Coronary Angiography;  Surgeon: Burnell Blanks, MD;  Location: [place] [facility];  Service: Cardiovascular;  Laterality: N/A;  . LEFT HEART CATHETERIZATION WITH CORONARY ANGIOGRAM N/A 12/22/2012   Procedure: LEFT HEART CATHETERIZATION WITH CORONARY ANGIOGRAM;  Surgeon: Sanda Klein, MD;  Location: [place] CATH LAB;  Service: Cardiovascular;  Laterality: N/A;  . NOSE SURGERY    . TOTAL KNEE ARTHROPLASTY Left 03/22/2017   Procedure: LEFT TOTAL KNEE ARTHROPLASTY;  Surgeon: Sydnee Cabal, MD;  [facility];  Service: Orthopedics;  Laterality: Left;  [name] VASECTOMY      There were no vitals filed for this  visit.  Subjective Assessment - 08/04/19 0815    Subjective  COVID-19 screen performed prior to patient entering clinic.  Reports more hip pain since stopping anti-inflammatories.    Pertinent History  CAD, left TKA (03/22/17).    How long can you walk comfortably?  Variable.    Patient Stated Goals  Decrease pain and swelling and avoid surgery.    Currently in Pain?  No/denies         [facility] PT Assessment - 08/04/19 0001      Assessment   Medical Diagnosis  H/o left total knee replacement.    Referring Provider (PT)  Paralee Cancel MD      Restrictions   Weight Bearing Restrictions  No                   [facility] Adult PT Treatment/Exercise - 08/04/19 0001      Knee/Hip Exercises: Aerobic   Nustep  L4 x15 min      Knee/Hip Exercises: Machines for Strengthening   Cybex Knee Extension  10# 3x10 reps    Cybex Knee Flexion  40# 3x10 reps    Cybex Leg Press  2 pl x30 reps      Knee/Hip Exercises: Standing   Terminal Knee Extension  Strengthening;Left;2 sets;10 reps    Terminal Knee Extension Limitations  Blue XTS    Hip Abduction  AROM;Both;2 sets;10 reps;Knee straight      Modalities   Modalities  Vasopneumatic      Vasopneumatic   Number Minutes Vasopneumatic   15 minutes    Vasopnuematic Location   Knee    Vasopneumatic Pressure  Low    Vasopneumatic Temperature   34 for edema                  PT Long Term Goals - 07/30/19 0846      PT LONG TERM GOAL #1   Title  Independent with a HEP.    Time  4    Period  Weeks    Status  On-going      PT LONG TERM GOAL #2   Title  Full left active knee extension in order to normalize gait.    Time  4    Period  Weeks    Status  On-going      PT LONG TERM GOAL #3   Title  Active left knee flexion to 120 degrees+ so the patient can perform functional tasks and do so with pain not > 2-3/10.    Time  4    Period  Weeks    Status  On-going      PT LONG TERM GOAL #4   Title  Perform a reciprocating stair  gait with one railing with pain not > 2-3/10.    Time  4    Period  Weeks    Status  On-going            Plan - 08/04/19 1028    Clinical Impression Statement  Patient presented in clinic with greater hip discomfort since stopping antiinflammatories. Patient able to tolerate LE strengthening without complaint of pain. Normal vasopneumatic response noted following removal of the modality.    Personal Factors and Comorbidities  Comorbidity 1    Comorbidities  CAD, left TKA (03/22/17).    Examination-Activity Limitations  Squat;Stairs;Other    Examination-Participation Restrictions  Other    Stability/Clinical Decision Making  Evolving/Moderate complexity    Rehab Potential  Good    PT Treatment/Interventions  ADLs/Self Care Home Management;Cryotherapy;Electrical Stimulation;Moist Heat;Gait training;Stair training;Functional mobility training;Therapeutic activities;Therapeutic exercise;Neuromuscular re-education;Manual techniques;Patient/family education;Passive range of motion;Vasopneumatic Device    PT Next Visit Plan  cont with POC for strengthening, ROM and  Vasopneumatic PRN.    Consulted and Agree with Plan of Care  Patient       Patient will benefit from skilled therapeutic intervention in order to improve the following deficits and impairments:  Pain, Increased edema, Decreased activity tolerance, Decreased range of motion  Visit Diagnosis: Chronic pain of left knee  Stiffness of left knee, not elsewhere classified  Localized edema     Problem List Patient Active Problem List   Diagnosis Date Noted  . Osteoarthritis of right knee 03/22/2017  . S/P knee replacement 03/22/2017  . Chest pain with high risk for cardiac etiology 08/28/2016  . Murmur, cardiac 04/01/2015  . Angina pectoris syndrome (HCC) 12/18/2012  . CAD (coronary artery disease) 12/18/2012  . Hyperlipidemia 12/18/2012  . Essential hypertension 12/18/2012  . Morbid obesity (HCC) 12/18/2012  .  Obstructive sleep apnea 12/18/2012    Marvell Fuller, PTA 08/04/2019, 10:34 AM  [facility] [address] [place], [place], [address] Phone: [phone]   Fax:  [phone]  Name: Craig Copeland MRN: [id number] Date of Birth: [birth year]

## 2019-08-06 ENCOUNTER — Other Ambulatory Visit: Payer: Self-pay

## 2019-08-06 ENCOUNTER — Ambulatory Visit: Payer: 59 | Admitting: Physical Therapy

## 2019-08-06 ENCOUNTER — Encounter: Payer: Self-pay | Admitting: Physical Therapy

## 2019-08-06 DIAGNOSIS — R600 Localized edema: Secondary | ICD-10-CM

## 2019-08-06 DIAGNOSIS — G8929 Other chronic pain: Secondary | ICD-10-CM

## 2019-08-06 DIAGNOSIS — M25562 Pain in left knee: Secondary | ICD-10-CM | POA: Diagnosis not present

## 2019-08-06 DIAGNOSIS — M25662 Stiffness of left knee, not elsewhere classified: Secondary | ICD-10-CM

## 2019-08-06 NOTE — Therapy (Signed)
[facility] [place], [place], [address] Phone: [phone]   Fax:  [phone]  Physical Therapy Treatment  Patient Details  Name: Craig Copeland MRN: [id number] Date of Birth: [birth year] Referring Provider (PT): Paralee Cancel MD   Encounter Date: 08/06/2019  PT End of Session - 08/06/19 0821    Visit Number  7    Number of Visits  12    Date for PT Re-Evaluation  08/14/19    PT Start Time  0818    PT Stop Time  0905    PT Time Calculation (min)  47 min    Activity Tolerance  Patient tolerated treatment well    Behavior During Therapy  [facility] for tasks assessed/performed       Past Medical History:  Diagnosis Date  . CAD (coronary artery disease)   . Dizziness   . Dyslipidemia   . Flu 06/2016   Had again 01/208 with PNA  . Obesity   . OSA (obstructive sleep apnea)   . Systemic hypertension   . Vasospastic angina [facility])     Past Surgical History:  Procedure Laterality Date  . CARDIAC CATHETERIZATION  12/02/2002   <20% restenosis RCA  . CARDIAC CATHETERIZATION  01/30/2010   20-30% in-stent restenosis,60-70% ostial stenosis  . CORONARY ANGIOPLASTY WITH STENT PLACEMENT  11/13/2002   mid RCA  . KNEE SURGERY  2003  . LEFT HEART CATH AND CORONARY ANGIOGRAPHY N/A 09/05/2016   Procedure: Left Heart Cath and Coronary Angiography;  Surgeon: Burnell Blanks, MD;  Location: [place] [facility];  Service: Cardiovascular;  Laterality: N/A;  . LEFT HEART CATHETERIZATION WITH CORONARY ANGIOGRAM N/A 12/22/2012   Procedure: LEFT HEART CATHETERIZATION WITH CORONARY ANGIOGRAM;  Surgeon: Sanda Klein, MD;  Location: [place] CATH LAB;  Service: Cardiovascular;  Laterality: N/A;  . NOSE SURGERY    . TOTAL KNEE ARTHROPLASTY Left 03/22/2017   Procedure: LEFT TOTAL KNEE ARTHROPLASTY;  Surgeon: Sydnee Cabal, MD;  [facility];  Service: Orthopedics;  Laterality: Left;  [name] VASECTOMY      There were no vitals filed for this  visit.  Subjective Assessment - 08/06/19 0821    Subjective  COVID-19 screen performed prior to patient entering clinic. Patient reports more stiffness and swelling after using electric blanket. Patient reports he also stepped in a hole in the yard and extended L knee. Patient reports more stiffness since incident.    Pertinent History  CAD, left TKA (03/22/17).    How long can you walk comfortably?  Variable.    Patient Stated Goals  Decrease pain and swelling and avoid surgery.    Currently in Pain?  Yes    Pain Score  3     Pain Location  Knee    Pain Orientation  Left    Pain Descriptors / Indicators  Discomfort    Pain Type  Acute pain    Pain Onset  In the past 7 days    Pain Frequency  Constant                       [facility] Adult PT Treatment/Exercise - 08/06/19 0001      Knee/Hip Exercises: Aerobic   Nustep  L4 x15 min      Knee/Hip Exercises: Machines for Strengthening   Cybex Knee Extension  10# 3x10 reps    Cybex Knee Flexion  40# 3x10 reps    Cybex Leg Press  1 pl x30 reps  Modalities   Modalities  Vasopneumatic      Vasopneumatic   Number Minutes Vasopneumatic   15 minutes    Vasopnuematic Location   Knee    Vasopneumatic Pressure  Medium    Vasopneumatic Temperature   34 for edema                  PT Long Term Goals - 07/30/19 0846      PT LONG TERM GOAL #1   Title  Independent with a HEP.    Time  4    Period  Weeks    Status  On-going      PT LONG TERM GOAL #2   Title  Full left active knee extension in order to normalize gait.    Time  4    Period  Weeks    Status  On-going      PT LONG TERM GOAL #3   Title  Active left knee flexion to 120 degrees+ so the patient can perform functional tasks and do so with pain not > 2-3/10.    Time  4    Period  Weeks    Status  On-going      PT LONG TERM GOAL #4   Title  Perform a reciprocating stair gait with one railing with pain not > 2-3/10.    Time  4    Period  Weeks     Status  On-going            Plan - 08/06/19 1042    Clinical Impression Statement  Patient presented in clinic with reports of two recent incidents that has resulted in more edema and pain. More edema noted along lateral L knee. Patient able to complete pain free machine strengthening without reports of pain. Some discomfort reported with Nustep due to LE motion. Normal vasopuematic response noted following removal of the modality.    Personal Factors and Comorbidities  Comorbidity 1    Comorbidities  CAD, left TKA (03/22/17).    Examination-Activity Limitations  Squat;Stairs;Other    Examination-Participation Restrictions  Other    Stability/Clinical Decision Making  Evolving/Moderate complexity    Rehab Potential  Good    PT Treatment/Interventions  ADLs/Self Care Home Management;Cryotherapy;Electrical Stimulation;Moist Heat;Gait training;Stair training;Functional mobility training;Therapeutic activities;Therapeutic exercise;Neuromuscular re-education;Manual techniques;Patient/family education;Passive range of motion;Vasopneumatic Device    PT Next Visit Plan  cont with POC for strengthening, ROM and  Vasopneumatic PRN.    Consulted and Agree with Plan of Care  Patient       Patient will benefit from skilled therapeutic intervention in order to improve the following deficits and impairments:  Pain, Increased edema, Decreased activity tolerance, Decreased range of motion  Visit Diagnosis: Chronic pain of left knee  Stiffness of left knee, not elsewhere classified  Localized edema     Problem List Patient Active Problem List   Diagnosis Date Noted  . Osteoarthritis of right knee 03/22/2017  . S/P knee replacement 03/22/2017  . Chest pain with high risk for cardiac etiology 08/28/2016  . Murmur, cardiac 04/01/2015  . Angina pectoris syndrome (HCC) 12/18/2012  . CAD (coronary artery disease) 12/18/2012  . Hyperlipidemia 12/18/2012  . Essential hypertension 12/18/2012  .  Morbid obesity (HCC) 12/18/2012  . Obstructive sleep apnea 12/18/2012    Marvell Fuller, PTA 08/06/2019, 10:45 AM  [facility] [address] [place], [place], [address] Phone: [phone]   Fax:  [phone]  Name: Craig Copeland MRN: [id number] Date of  Birth: 18-Feb-1956

## 2019-08-11 ENCOUNTER — Ambulatory Visit: Payer: 59 | Admitting: Physical Therapy

## 2019-08-11 ENCOUNTER — Other Ambulatory Visit: Payer: Self-pay

## 2019-08-11 ENCOUNTER — Encounter: Payer: Self-pay | Admitting: Physical Therapy

## 2019-08-11 DIAGNOSIS — M25562 Pain in left knee: Secondary | ICD-10-CM | POA: Diagnosis not present

## 2019-08-11 DIAGNOSIS — G8929 Other chronic pain: Secondary | ICD-10-CM

## 2019-08-11 DIAGNOSIS — R600 Localized edema: Secondary | ICD-10-CM

## 2019-08-11 DIAGNOSIS — M25662 Stiffness of left knee, not elsewhere classified: Secondary | ICD-10-CM

## 2019-08-11 NOTE — Therapy (Signed)
[facility] [address] [place], [place], [address] Phone: [phone]   Fax:  [phone]  Physical Therapy Treatment  Patient Details  Name: Craig Copeland MRN: [id number] Date of Birth: 06/09/1956 Referring Provider (PT): Durene Romans MD   Encounter Date: 08/11/2019  PT End of Session - 08/11/19 0831    Visit Number  8    Number of Visits  12    Date for PT Re-Evaluation  08/14/19    PT Start Time  0817    PT Stop Time  0908    PT Time Calculation (min)  51 min    Activity Tolerance  Patient tolerated treatment well    Behavior During Therapy  [facility] for tasks assessed/performed       Past Medical History:  Diagnosis Date  . CAD (coronary artery disease)   . Dizziness   . Dyslipidemia   . Flu 06/2016   Had again 01/208 with PNA  . Obesity   . OSA (obstructive sleep apnea)   . Systemic hypertension   . Vasospastic angina [facility])     Past Surgical History:  Procedure Laterality Date  . CARDIAC CATHETERIZATION  12/02/2002   <20% restenosis RCA  . CARDIAC CATHETERIZATION  01/30/2010   20-30% in-stent restenosis,60-70% ostial stenosis  . CORONARY ANGIOPLASTY WITH STENT PLACEMENT  11/13/2002   mid RCA  . KNEE SURGERY  2003  . LEFT HEART CATH AND CORONARY ANGIOGRAPHY N/A 09/05/2016   Procedure: Left Heart Cath and Coronary Angiography;  Surgeon: Kathleene Hazel, MD;  Location: [facility] INVASIVE CV LAB;  Service: Cardiovascular;  Laterality: N/A;  . LEFT HEART CATHETERIZATION WITH CORONARY ANGIOGRAM N/A 12/22/2012   Procedure: LEFT HEART CATHETERIZATION WITH CORONARY ANGIOGRAM;  Surgeon: Thurmon Fair, MD;  Location: [facility] CATH LAB;  Service: Cardiovascular;  Laterality: N/A;  . NOSE SURGERY    . TOTAL KNEE ARTHROPLASTY Left 03/22/2017   Procedure: LEFT TOTAL KNEE ARTHROPLASTY;  Surgeon: Eugenia Mcalpine, MD;  Location: [facility] ORS;  Service: Orthopedics;  Laterality: Left;  [name] VASECTOMY      There were no vitals filed for this  visit.  Subjective Assessment - 08/11/19 0830    Subjective  COVID-19 screen performed prior to patient entering clinic. Patient reports his knee feeling much better than it did last week. Reports that he did have a lot of swelling Saturday as he was on his feet all day.    Pertinent History  CAD, left TKA (03/22/17).    How long can you walk comfortably?  Variable.    Patient Stated Goals  Decrease pain and swelling and avoid surgery.    Currently in Pain?  Yes    Pain Score  1     Pain Location  Knee    Pain Orientation  Left    Pain Descriptors / Indicators  Discomfort    Pain Type  Acute pain    Pain Onset  In the past 7 days    Pain Frequency  Intermittent         [facility] PT Assessment - 08/11/19 0001      Assessment   Medical Diagnosis  H/o left total knee replacement.    Referring Provider (PT)  Durene Romans MD      Restrictions   Weight Bearing Restrictions  No                   [facility] Adult PT Treatment/Exercise - 08/11/19 0001      Knee/Hip Exercises: Aerobic  Nustep  L4 x15 min      Knee/Hip Exercises: Machines for Strengthening   Cybex Knee Extension  10# 3x10 reps    Cybex Knee Flexion  40# 3x10 reps    Cybex Leg Press  1.5 pl x30 reps      Knee/Hip Exercises: Supine   Bridges with Clamshell  Strengthening;Both;20 reps   green theraband   Other Supine Knee/Hip Exercises  B clam green theraband x20 reps      Modalities   Modalities  Vasopneumatic      Vasopneumatic   Number Minutes Vasopneumatic   15 minutes    Vasopnuematic Location   Knee    Vasopneumatic Pressure  Medium    Vasopneumatic Temperature   34 for edema                  PT Long Term Goals - 07/30/19 0846      PT LONG TERM GOAL #1   Title  Independent with a HEP.    Time  4    Period  Weeks    Status  On-going      PT LONG TERM GOAL #2   Title  Full left active knee extension in order to normalize gait.    Time  4    Period  Weeks    Status  On-going       PT LONG TERM GOAL #3   Title  Active left knee flexion to 120 degrees+ so the patient can perform functional tasks and do so with pain not > 2-3/10.    Time  4    Period  Weeks    Status  On-going      PT LONG TERM GOAL #4   Title  Perform a reciprocating stair gait with one railing with pain not > 2-3/10.    Time  4    Period  Weeks    Status  On-going            Plan - 08/11/19 0930    Clinical Impression Statement  Patient presented in clinic with reports of discomfort over the weekend from a very busy day with prolonged standing. Patient able to complete therex well with less knee pain. Patient compliant with icing over the weekend for pain and edema control. Increased edema observed along anterior L knee surrounding patella. Normal modalities response noted following removal of the modalties.    Personal Factors and Comorbidities  Comorbidity 1    Comorbidities  CAD, left TKA (03/22/17).    Examination-Activity Limitations  Squat;Stairs;Other    Examination-Participation Restrictions  Other    Stability/Clinical Decision Making  Evolving/Moderate complexity    Rehab Potential  Good    PT Treatment/Interventions  ADLs/Self Care Home Management;Cryotherapy;Electrical Stimulation;Moist Heat;Gait training;Stair training;Functional mobility training;Therapeutic activities;Therapeutic exercise;Neuromuscular re-education;Manual techniques;Patient/family education;Passive range of motion;Vasopneumatic Device    PT Next Visit Plan  cont with POC for strengthening, ROM and  Vasopneumatic PRN.    Consulted and Agree with Plan of Care  Patient       Patient will benefit from skilled therapeutic intervention in order to improve the following deficits and impairments:  Pain, Increased edema, Decreased activity tolerance, Decreased range of motion  Visit Diagnosis: Chronic pain of left knee  Stiffness of left knee, not elsewhere classified  Localized edema     Problem  List Patient Active Problem List   Diagnosis Date Noted  . Osteoarthritis of right knee 03/22/2017  . S/P knee replacement 03/22/2017  . Chest  pain with high risk for cardiac etiology 08/28/2016  . Murmur, cardiac 04/01/2015  . Angina pectoris syndrome ([place]) 12/18/2012  . CAD (coronary artery disease) 12/18/2012  . Hyperlipidemia 12/18/2012  . Essential hypertension 12/18/2012  . Morbid obesity ([place]) 12/18/2012  . Obstructive sleep apnea 12/18/2012    Standley Brooking, PTA 08/11/2019, 9:40 AM  [facility] [address] [place], [place], [address] Phone: [phone]   Fax:  [phone]  Name: Craig Copeland MRN: [id number] Date of Birth: Jan 06, 1956

## 2019-08-18 ENCOUNTER — Ambulatory Visit: Payer: 59 | Admitting: Physical Therapy

## 2019-08-18 ENCOUNTER — Encounter: Payer: Self-pay | Admitting: Physical Therapy

## 2019-08-18 ENCOUNTER — Other Ambulatory Visit: Payer: Self-pay

## 2019-08-18 DIAGNOSIS — M25562 Pain in left knee: Secondary | ICD-10-CM | POA: Diagnosis not present

## 2019-08-18 DIAGNOSIS — M25662 Stiffness of left knee, not elsewhere classified: Secondary | ICD-10-CM

## 2019-08-18 DIAGNOSIS — G8929 Other chronic pain: Secondary | ICD-10-CM

## 2019-08-18 DIAGNOSIS — R600 Localized edema: Secondary | ICD-10-CM

## 2019-08-18 NOTE — Therapy (Signed)
[facility] [address] [place], [place], [address] Phone: [phone]   Fax:  [phone]  Physical Therapy Treatment  Patient Details  Name: Craig Copeland MRN: [id number] Date of Birth: December 15, 1955 Referring Provider (PT): Durene Romans MD   Encounter Date: 08/18/2019  PT End of Session - 08/18/19 0834    Visit Number  9    Number of Visits  12    Date for PT Re-Evaluation  08/14/19    PT Start Time  0815    PT Stop Time  0909    PT Time Calculation (min)  54 min    Activity Tolerance  Patient tolerated treatment well    Behavior During Therapy  [facility] for tasks assessed/performed       Past Medical History:  Diagnosis Date  . CAD (coronary artery disease)   . Dizziness   . Dyslipidemia   . Flu 06/2016   Had again 01/208 with PNA  . Obesity   . OSA (obstructive sleep apnea)   . Systemic hypertension   . Vasospastic angina [facility])     Past Surgical History:  Procedure Laterality Date  . CARDIAC CATHETERIZATION  12/02/2002   <20% restenosis RCA  . CARDIAC CATHETERIZATION  01/30/2010   20-30% in-stent restenosis,60-70% ostial stenosis  . CORONARY ANGIOPLASTY WITH STENT PLACEMENT  11/13/2002   mid RCA  . KNEE SURGERY  2003  . LEFT HEART CATH AND CORONARY ANGIOGRAPHY N/A 09/05/2016   Procedure: Left Heart Cath and Coronary Angiography;  Surgeon: Kathleene Hazel, MD;  Location: [facility] INVASIVE CV LAB;  Service: Cardiovascular;  Laterality: N/A;  . LEFT HEART CATHETERIZATION WITH CORONARY ANGIOGRAM N/A 12/22/2012   Procedure: LEFT HEART CATHETERIZATION WITH CORONARY ANGIOGRAM;  Surgeon: [place]mon Fair, MD;  Location: [facility] CATH LAB;  Service: Cardiovascular;  Laterality: N/A;  . NOSE SURGERY    . TOTAL KNEE ARTHROPLASTY Left 03/22/2017   Procedure: LEFT TOTAL KNEE ARTHROPLASTY;  Surgeon: Eugenia Mcalpine, MD;  Location: [facility] ORS;  Service: Orthopedics;  Laterality: Left;  [name] VASECTOMY      There were no vitals filed for this  visit.  Subjective Assessment - 08/18/19 0822    Subjective  COVID 19 screening performed on patient upon arrival. Patient reports some swelling today.    Pertinent History  CAD, left TKA (03/22/17).    How long can you walk comfortably?  Variable.    Patient Stated Goals  Decrease pain and swelling and avoid surgery.    Currently in Pain?  Yes    Pain Score  1     Pain Location  Knee    Pain Orientation  Left    Pain Descriptors / Indicators  Discomfort    Pain Type  Acute pain    Pain Onset  More than a month ago    Pain Frequency  Intermittent         [facility] PT Assessment - 08/18/19 0001      Assessment   Medical Diagnosis  H/o left total knee replacement.    Referring Provider (PT)  Durene Romans MD    Next MD Visit  08/26/2019      Restrictions   Weight Bearing Restrictions  No                   [facility] Adult PT Treatment/Exercise - 08/18/19 0001      Knee/Hip Exercises: Aerobic   Nustep  L6 x15 min      Knee/Hip Exercises: Machines for Strengthening  Cybex Knee Extension  20# 3x10 reps    Cybex Knee Flexion  40# 3x10 reps    Cybex Leg Press  1.5 pl x30 reps      Knee/Hip Exercises: Standing   Step Down  Left;2 sets;10 reps;Hand Hold: 0;Step Height: 4"   Heel dot     Modalities   Modalities  Electrical Stimulation;Vasopneumatic      Electrical Stimulation   Electrical Stimulation Location  L knee    Electrical Stimulation Action  IFC    Electrical Stimulation Parameters  80-150 hz x15 min    Electrical Stimulation Goals  Pain;Edema      Vasopneumatic   Number Minutes Vasopneumatic   15 minutes    Vasopnuematic Location   Knee    Vasopneumatic Pressure  Medium    Vasopneumatic Temperature   34 for edema                  PT Long Term Goals - 07/30/19 0846      PT LONG TERM GOAL #1   Title  Independent with a HEP.    Time  4    Period  Weeks    Status  On-going      PT LONG TERM GOAL #2   Title  Full left active knee extension  in order to normalize gait.    Time  4    Period  Weeks    Status  On-going      PT LONG TERM GOAL #3   Title  Active left knee flexion to 120 degrees+ so the patient can perform functional tasks and do so with pain not > 2-3/10.    Time  4    Period  Weeks    Status  On-going      PT LONG TERM GOAL #4   Title  Perform a reciprocating stair gait with one railing with pain not > 2-3/10.    Time  4    Period  Weeks    Status  On-going            Plan - 08/18/19 0920    Clinical Impression Statement  Patient presented in clinic with reports of continued edema and L knee discomfort which is always worse by the end of the day. Edema more notable in the superiolateral aspect of L knee. Patient able to complete all therex fairly well. Electrical stimulation added to modalities session to assist with edema and pain control. Normal modalities response noted following removal of the modalities.    Personal Factors and Comorbidities  Comorbidity 1    Comorbidities  CAD, left TKA (03/22/17).    Examination-Activity Limitations  Squat;Stairs;Other    Examination-Participation Restrictions  Other    Stability/Clinical Decision Making  Evolving/Moderate complexity    Rehab Potential  Good    PT Treatment/Interventions  ADLs/Self Care Home Management;Cryotherapy;Electrical Stimulation;Moist Heat;Gait training;Stair training;Functional mobility training;Therapeutic activities;Therapeutic exercise;Neuromuscular re-education;Manual techniques;Patient/family education;Passive range of motion;Vasopneumatic Device    PT Next Visit Plan  cont with POC for strengthening, ROM and  Vasopneumatic PRN.    Consulted and Agree with Plan of Care  Patient       Patient will benefit from skilled therapeutic intervention in order to improve the following deficits and impairments:  Pain, Increased edema, Decreased activity tolerance, Decreased range of motion  Visit Diagnosis: Chronic pain of left  knee  Stiffness of left knee, not elsewhere classified  Localized edema     Problem List Patient Active Problem List  Diagnosis Date Noted  . Osteoarthritis of right knee 03/22/2017  . S/P knee replacement 03/22/2017  . Chest pain with high risk for cardiac etiology 08/28/2016  . Murmur, cardiac 04/01/2015  . Angina pectoris syndrome ([facility]) 12/18/2012  . CAD (coronary artery disease) 12/18/2012  . Hyperlipidemia 12/18/2012  . Essential hypertension 12/18/2012  . Morbid obesity ([facility]) 12/18/2012  . Obstructive sleep apnea 12/18/2012    Marvell Fuller, PTA 08/18/2019, 9:24 AM  [facility] [address] [place], [place], [address] Phone: [phone]   Fax:  [phone]  Name: MAKAVELI HOARD MRN: [id number] Date of Birth: Feb 04, 1956

## 2019-08-20 ENCOUNTER — Encounter: Payer: 59 | Admitting: Physical Therapy

## 2019-08-25 ENCOUNTER — Ambulatory Visit: Payer: 59 | Admitting: *Deleted

## 2019-08-25 ENCOUNTER — Other Ambulatory Visit: Payer: Self-pay

## 2019-08-25 DIAGNOSIS — G8929 Other chronic pain: Secondary | ICD-10-CM

## 2019-08-25 DIAGNOSIS — M25662 Stiffness of left knee, not elsewhere classified: Secondary | ICD-10-CM

## 2019-08-25 DIAGNOSIS — M25562 Pain in left knee: Secondary | ICD-10-CM | POA: Diagnosis not present

## 2019-08-25 DIAGNOSIS — R600 Localized edema: Secondary | ICD-10-CM

## 2019-08-25 NOTE — Therapy (Signed)
[facility] [place], [place], [address] Phone: [phone]   Fax:  [phone]  Physical Therapy Treatment  Patient Details  Name: Craig Copeland MRN: [id number] Date of Birth: 03/16/1956 Referring Provider (PT): Paralee Cancel MD   Encounter Date: 08/25/2019  PT End of Session - 08/25/19 0826    Visit Number  10    Number of Visits  12    Date for PT Re-Evaluation  09/04/19    PT Start Time  0815    PT Stop Time  0910    PT Time Calculation (min)  55 min       Past Medical History:  Diagnosis Date  . CAD (coronary artery disease)   . Dizziness   . Dyslipidemia   . Flu 06/2016   Had again 01/208 with PNA  . Obesity   . OSA (obstructive sleep apnea)   . Systemic hypertension   . Vasospastic angina [facility])     Past Surgical History:  Procedure Laterality Date  . CARDIAC CATHETERIZATION  12/02/2002   <20% restenosis RCA  . CARDIAC CATHETERIZATION  01/30/2010   20-30% in-stent restenosis,60-70% ostial stenosis  . CORONARY ANGIOPLASTY WITH STENT PLACEMENT  11/13/2002   mid RCA  . KNEE SURGERY  2003  . LEFT HEART CATH AND CORONARY ANGIOGRAPHY N/A 09/05/2016   Procedure: Left Heart Cath and Coronary Angiography;  Surgeon: Burnell Blanks, MD;  Location: [place] CV LAB;  Service: Cardiovascular;  Laterality: N/A;  . LEFT HEART CATHETERIZATION WITH CORONARY ANGIOGRAM N/A 12/22/2012   Procedure: LEFT HEART CATHETERIZATION WITH CORONARY ANGIOGRAM;  Surgeon: Sanda Klein, MD;  Location: [place] CATH LAB;  Service: Cardiovascular;  Laterality: N/A;  . NOSE SURGERY    . TOTAL KNEE ARTHROPLASTY Left 03/22/2017   Procedure: LEFT TOTAL KNEE ARTHROPLASTY;  Surgeon: Sydnee Cabal, MD;  Location: [facility];  Service: Orthopedics;  Laterality: Left;  [name] VASECTOMY      There were no vitals filed for this visit.  Subjective Assessment - 08/25/19 0826    Subjective  COVID 19 screening performed on patient upon arrival. To MD  tomorrow. Pain today 1-2/10. Pain and swelling is still bad in the evening.    Pertinent History  CAD, left TKA (03/22/17).    How long can you walk comfortably?  Variable.    Patient Stated Goals  Decrease pain and swelling and avoid surgery.    Currently in Pain?  Yes    Pain Score  2     Pain Location  Knee    Pain Orientation  Left    Pain Descriptors / Indicators  Discomfort    Pain Type  Acute pain    Pain Onset  More than a month ago                       [facility] Adult PT Treatment/Exercise - 08/25/19 0001      Knee/Hip Exercises: Aerobic   Nustep  L6 x15 min      Knee/Hip Exercises: Machines for Strengthening   Cybex Knee Extension  20# 3x10 reps    Cybex Knee Flexion  40# 3x10 reps    Cybex Leg Press  1.5 pl x30 reps      Knee/Hip Exercises: Standing   Step Down  Left;2 sets;10 reps;Hand Hold: 0;Step Height: 4"   Heel dot     Modalities   Modalities  Electrical Stimulation;Vasopneumatic      Acupuncturist  Location  L knee    Electrical Stimulation Action  IFC    Electrical Stimulation Parameters  1-_0  x 15 mins    Electrical Stimulation Goals  Pain;Edema      Vasopneumatic   Number Minutes Vasopneumatic   15 minutes    Vasopnuematic Location   Knee    Vasopneumatic Pressure  Medium    Vasopneumatic Temperature   34 for edema      Manual Therapy   Manual Therapy  Passive ROM    Passive ROM  manual PROM for left knee flexion to 115 degrees  /ext to -8 degrees    with low load holds to improve mobility                  PT Long Term Goals - 08/25/19 0830      PT LONG TERM GOAL #1   Title  Independent with a HEP.    Time  4    Period  Weeks    Status  Achieved      PT LONG TERM GOAL #2   Title  Full left active knee extension in order to normalize gait.    Baseline  08-25-19 -8 degrees    Time  4    Period  Weeks    Status  On-going      PT LONG TERM GOAL #3   Title  Active left knee flexion  to 120 degrees+ so the patient can perform functional tasks and do so with pain not > 2-3/10.    Baseline  08-25-19  115 degrees    Time  4    Period  Weeks    Status  On-going      PT LONG TERM GOAL #4   Title  Perform a reciprocating stair gait with one railing with pain not > 2-3/10.    Time  4    Period  Weeks    Status  Partially Met   In the AM only           Plan - 08/25/19 1356    Clinical Impression Statement  Pt arrived today with minimal LT knee pain, but reports the pain and swelling are a lot worse in the evening. Pt was guided through strengthening exs and did well.with mainly mm fatigue. He reports doing ok with negotiaing steps in the morning , but unable to in the PM due to pain/ swellinf and LTG was partially met    Personal Factors and Comorbidities  Comorbidity 1    Comorbidities  CAD, left TKA (03/22/17).    Stability/Clinical Decision Making  Evolving/Moderate complexity    Rehab Potential  Good    PT Treatment/Interventions  ADLs/Self Care Home Management;Cryotherapy;Electrical Stimulation;Moist Heat;Gait training;Stair training;Functional mobility training;Therapeutic activities;Therapeutic exercise;Neuromuscular re-education;Manual techniques;Patient/family education;Passive range of motion;Vasopneumatic Device    PT Next Visit Plan  Await MD Visit    Consulted and Agree with Plan of Care  Patient       Patient will benefit from skilled therapeutic intervention in order to improve the following deficits and impairments:  Pain, Increased edema, Decreased activity tolerance, Decreased range of motion  Visit Diagnosis: Chronic pain of left knee  Stiffness of left knee, not elsewhere classified  Localized edema     Problem List Patient Active Problem List   Diagnosis Date Noted  . Osteoarthritis of right knee 03/22/2017  . S/P knee replacement 03/22/2017  . Chest pain with high risk for cardiac etiology 08/28/2016  . Murmur, cardiac 04/01/2015  .  Angina pectoris syndrome ([place]) 12/18/2012  . CAD (coronary artery disease) 12/18/2012  . Hyperlipidemia 12/18/2012  . Essential hypertension 12/18/2012  . Morbid obesity ([place]) 12/18/2012  . Obstructive sleep apnea 12/18/2012    Craig Copeland,[name], PTA 08/25/2019, 2:25 PM  [facility] [address] [place], [place], [address] Phone: [phone]   Fax:  [phone]  Name: LONN IM MRN: [id number] Date of Birth: 1956/06/02

## 2019-08-27 ENCOUNTER — Encounter: Payer: 59 | Admitting: Physical Therapy

## 2019-09-24 ENCOUNTER — Ambulatory Visit: Payer: 59 | Attending: Internal Medicine

## 2019-09-24 DIAGNOSIS — Z23 Encounter for immunization: Secondary | ICD-10-CM

## 2019-09-24 NOTE — Progress Notes (Signed)
   Covid-19 Vaccination Clinic  Name:  Craig Copeland    MRN: [id number] DOB: 11/11/1955  09/24/2019  [name] was observed post Covid-19 immunization for 15 minutes without incident. He was provided with Vaccine Information Sheet and instruction to access the V-Safe system.   [name] was instructed to call 911 with any severe reactions post vaccine: [name] Difficulty breathing  . Swelling of face and throat  . A fast heartbeat  . A bad rash all over body  . Dizziness and weakness   Immunizations Administered    Name Date Dose VIS Date Route   Moderna COVID-19 Vaccine 09/24/2019  9:32 AM 0.5 mL 06/02/2019 Intramuscular   Manufacturer: Moderna   Lot: [id number]   NDC: 21975-883-25

## 2019-10-27 ENCOUNTER — Ambulatory Visit: Payer: 59 | Attending: Internal Medicine

## 2019-10-27 DIAGNOSIS — Z23 Encounter for immunization: Secondary | ICD-10-CM

## 2019-10-27 NOTE — Progress Notes (Signed)
   Covid-19 Vaccination Clinic  Name:  Craig Copeland    MRN: [id number] DOB: 01-24-1956  10/27/2019  Craig Copeland was observed post Covid-19 immunization for 15 minutes without incident. He was provided with Vaccine Information Sheet and instruction to access the V-Safe system.   Craig Copeland was instructed to call 911 with any severe reactions post vaccine: [name] Difficulty breathing  . Swelling of face and throat  . A fast heartbeat  . A bad rash all over body  . Dizziness and weakness   Immunizations Administered    Name Date Dose VIS Date Route   Moderna COVID-19 Vaccine 10/27/2019 10:40 AM 0.5 mL 06/2019 Intramuscular   Manufacturer: Moderna   Lot: [id number]   NDC: 67014-103-01

## 2020-02-12 ENCOUNTER — Encounter: Payer: Self-pay | Admitting: Cardiovascular Disease

## 2020-02-12 ENCOUNTER — Other Ambulatory Visit: Payer: Self-pay

## 2020-02-12 ENCOUNTER — Ambulatory Visit (INDEPENDENT_AMBULATORY_CARE_PROVIDER_SITE_OTHER): Payer: 59 | Admitting: Cardiovascular Disease

## 2020-02-12 VITALS — BP 126/77 | HR 67 | Ht 73.0 in | Wt 309.0 lb

## 2020-02-12 DIAGNOSIS — E7800 Pure hypercholesterolemia, unspecified: Secondary | ICD-10-CM | POA: Diagnosis not present

## 2020-02-12 DIAGNOSIS — I10 Essential (primary) hypertension: Secondary | ICD-10-CM

## 2020-02-12 DIAGNOSIS — Z9989 Dependence on other enabling machines and devices: Secondary | ICD-10-CM

## 2020-02-12 DIAGNOSIS — G4733 Obstructive sleep apnea (adult) (pediatric): Secondary | ICD-10-CM

## 2020-02-12 DIAGNOSIS — I25118 Atherosclerotic heart disease of native coronary artery with other forms of angina pectoris: Secondary | ICD-10-CM

## 2020-02-12 DIAGNOSIS — I351 Nonrheumatic aortic (valve) insufficiency: Secondary | ICD-10-CM

## 2020-02-12 NOTE — Patient Instructions (Signed)

## 2020-02-12 NOTE — Progress Notes (Signed)
Patient ID: Craig FisherMichael K Copeland, male   DOB: 01-24-1956, 64 y.o.   MRN: [id number]     Cardiology Office Note   Date:  02/12/2020   ID:  Craig FisherMichael K Copeland, DOB 01-24-1956, MRN [id number]  PCP:  Merri BrunettePharr, [name], MD  Cardiologist:   Thurmon FairMihai Dymir Neeson, MD   Chief Complaint  Patient presents with  . Coronary Artery Disease    History of Present Illness: Craig Copeland is a 64 y.o. male who presents for follow-up for coronary artery disease, hypertension and hyperlipidemia.  He has done quite well from a cardiovascular point of view. 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 He can do fairly intense physical work without chest pain. He occasionally has to stop for shortness of breath but has good functional status overall. He is only used nitroglycerin twice in the last year. He has occasional problems with mild ankle edema. His biggest complaints are pain in his left knee (for which meloxicam works well) but also bilateral hip pain for which tends to help. He does have arthritis according to x-rays. He is also having issues with numbness in his fourth and fifth fingers of the left hand and is scheduled to undergo nerve conduction studies.  He is compliant with CPAP and denies daytime hypersomnolence. He has not had any GI complaints. He denies any bleeding. He does not have intermittent claudication or focal neurological events other than the numbness in his left hand fingers. Affect without need for a mean that that started really  He continues to be very physically active.  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  He last underwent cardiac catheterization on September 05, 2016. The previously placed stents in his right coronary artery were widely patent and the only significant stenosis was an ostial 70% lesion in a relatively small caliber ramus  intermedius artery. No revascularization was performed.  He seems to be tolerating pravastatin without problems. He was not tolerant of either rosuvastatin or atorvastatin.            He has a history of previous stent to the right coronary artery(2004, drug-eluting 3.0 x 33 mm Cypher stent overlapping 3.0 x 23 mm Cypher stent in the mid to proximal right coronary artery) and a moderate to severe ostial lesion of a small to medium ramus intermedius artery (too small for percutaneous revascularization). No change on cardiac cath performed in 2018. 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            His last cardiac catheterization was in March 2018, that showed findings identical to 2014, 2011 and 2004. He has normal left ventricle systolic function by echo most recently performed in October 2016; EF 50-55% by LV angiography in March 2018.  History of hypertension and hyperlipidemia. He is morbidly obese. He is compliant with CPAP for obstructive sleep apnea. He is on chronic treatment with aspirin and clopidogrel.    Past Medical History:  Diagnosis Date  . CAD (coronary artery disease)   . Dizziness   . Dyslipidemia   . Flu 06/2016   Had again 01/208 with PNA  . Obesity   . OSA (obstructive sleep apnea)   . Systemic hypertension   . Vasospastic angina [facility](HCC)     Past Surgical History:  Procedure Laterality Date  . CARDIAC CATHETERIZATION  12/02/2002   <20% restenosis RCA  . CARDIAC CATHETERIZATION  01/30/2010   20-30% in-stent restenosis,60-70% ostial stenosis  . CORONARY ANGIOPLASTY WITH STENT PLACEMENT  11/13/2002   mid RCA  . KNEE SURGERY  2003  . LEFT HEART CATH AND CORONARY ANGIOGRAPHY N/A 09/05/2016   Procedure: Left Heart Cath and Coronary Angiography;  Surgeon: Kathleene Hazel, MD;  Location: [facility] INVASIVE CV LAB;  Service: Cardiovascular;  Laterality: N/A;  . LEFT HEART CATHETERIZATION WITH CORONARY  ANGIOGRAM N/A 12/22/2012   Procedure: LEFT HEART CATHETERIZATION WITH CORONARY ANGIOGRAM;  Surgeon: Thurmon Fair, MD;  Location: [facility] CATH LAB;  Service: Cardiovascular;  Laterality: N/A;  . NOSE SURGERY    . TOTAL KNEE ARTHROPLASTY Left 03/22/2017   Procedure: LEFT TOTAL KNEE ARTHROPLASTY;  Surgeon: Eugenia Mcalpine, MD;  Location: [facility] ORS;  Service: Orthopedics;  Laterality: Left;  [name] VASECTOMY       Current Outpatient Medications  Medication Sig Dispense Refill  . amLODipine (NORVASC) 5 MG tablet TAKE 1 TABLET BY MOUTH EVERY DAY IN THE EVENING 90 tablet 2  . aspirin EC 81 MG tablet Take 81 mg by mouth daily.    . cholecalciferol (VITAMIN D) 1000 units tablet Take 1,000 Units by mouth daily.    . clopidogrel (PLAVIX) 75 MG tablet Take 75 mg by mouth daily.    [name] ezetimibe (ZETIA) 10 MG tablet Take 10 mg by mouth every evening.     . fish oil-omega-3 fatty acids 1000 MG capsule Take 1 g by mouth daily.     . isosorbide mononitrate (IMDUR) 30 MG 24 hr tablet TAKE 2 TABLETS BY MOUTH DAILY 180 tablet 2  . losartan (COZAAR) 100 MG tablet Take 0.5 tablets (50 mg total) by mouth daily. 90 tablet 3  . Melatonin 10 MG CAPS Take 10 mg by mouth at bedtime.    . meloxicam (MOBIC) 15 MG tablet Take 15 mg by mouth 3 (three) times daily.    . nitroGLYCERIN (NITROSTAT) 0.4 MG SL tablet Place 1 tablet (0.4 mg total) under the tongue every 5 (five) minutes as needed for chest pain. 90 tablet 0  . pravastatin (PRAVACHOL) 80 MG tablet Take 80 mg by mouth at bedtime.      No current facility-administered medications for this visit.    Allergies:   Patient has no known allergies.    Social History:  The patient  reports that he has never smoked. He has never used smokeless tobacco. He reports current alcohol use of about 1.0 standard drink of alcohol per week. He reports that he does not use drugs.   Family History:  The patient's family history includes Alzheimer's disease in his paternal grandfather; Cancer  in his paternal grandfather; Heart failure in his father and maternal grandmother; Hypertension in his father; Stroke in his paternal grandmother.    ROS:  Please see the history of present illness.  He denies shortness of breath at rest or with activity, orthopnea, PND, claudication, focal neurological complaints.  All other systems are reviewed and are negative.  PHYSICAL EXAM: VS:  BP 126/77   Pulse 67   Ht 6\' 1"  (1.854 m)   Wt (!) 309 lb (140.2 kg)   SpO2 98%   BMI 40.77 kg/m  , BMI Body mass index is 40.77 kg/m.   General: Alert, oriented x3, no distress, morbidly obese. Head: no evidence of trauma, PERRL, EOMI, no exophtalmos or lid lag, no myxedema, no xanthelasma; normal ears, nose and oropharynx Neck: normal jugular venous  pulsations and no hepatojugular reflux; brisk carotid pulses without delay and no carotid bruits Chest: clear to auscultation, no signs of consolidation by percussion or palpation, normal fremitus, symmetrical and full respiratory excursions Cardiovascular: normal position and quality of the apical impulse, regular rhythm, normal first and second heart sounds, 2/6 crescendo decrescendo aortic ejection murmur, no diastolic murmurs, rubs or gallops Abdomen: no tenderness or distention, no masses by palpation, no abnormal pulsatility or arterial bruits, normal bowel sounds, no hepatosplenomegaly Extremities: no clubbing, cyanosis or edema; 2+ radial, ulnar and brachial pulses bilaterally; 2+ right femoral, posterior tibial and dorsalis pedis pulses; 2+ left femoral, posterior tibial and dorsalis pedis pulses; no subclavian or femoral bruits Neurological: grossly nonfocal Psych: Normal mood and affect  EKG:  EKG is ordered today. Unchanged from previous tracings, shows normal sinus rhythm and an old inferior infarction, no acute repolarization changes, QTC 424 ms. Recent Labs:  Lipid Panel     Component Value Date/Time   CHOL 137 04/08/2015 0804   TRIG 157  (H) 04/08/2015 0804   HDL 32 (L) 04/08/2015 0804   CHOLHDL 4.3 04/08/2015 0804   VLDL 31 (H) 04/08/2015 0804   LDLCALC 74 04/08/2015 0804    February 20, 2018 Total cholesterol 117, HDL 32, triglycerides 108, calculated LDL 67.  908 2020 Total cholesterol 119, HDL 34, triglycerides 72 Creatinine 0.7, normal liver function test  Wt Readings from Last 3 Encounters:  02/12/20 (!) 309 lb (140.2 kg)  12/31/18 299 lb 6.4 oz (135.8 kg)  10/30/18 290 lb (131.5 kg)     ASSESSMENT AND PLAN:  1. Coronary artery disease of native artery of native heart with stable angina pectoris (HCC)   2. Pure hypercholesterolemia   3. Essential hypertension   4. Morbid obesity (HCC)   5. OSA on CPAP   6. Aortic ejection murmur      1. CAD s/p PCI RCA: In addition to known fixed CAD has had episodes in the past highly suggestive of vasospastic angina. Unable to tolerate higher doses of beta-blocker due to bradycardia or higher dose of amlodipine due to lower extremity edema. Symptoms are currently well controlled..  On dual antiplatelet therapy and statin. 2. Hyperlipidemia. Did not tolerate more potent statins. LDL in target range on pravastatin plus ezetimibe. Labs scheduled for next month with Dr. Renne Crigler. Low HDL is a chronic problem that will not improve without substantial weight loss. 3. Hypertension excellent control even though he is taking meloxicam frequently. 4. Morbid obesity: He is actually gained weight during the coronavirus pandemic, like many other patients. Encouraged him to review his diet and limit intake of carbohydrates and saturated fat. 5. OSA: He reports 100% compliance with CPAP and does not have symptoms of daytime hypersomnolence. 6. Aortic sclerosis/AS: His murmur is slightly louder than last year and is strongly suggestive of aortic stenosis. Echo in 2016 showed aortic valve sclerosis without increased gradients. Discussed the symptoms of aortic stenosis which will occur with  exertion. This point it does not appear necessary to repeat an echocardiogram.  Current medicines are reviewed at length with the patient today.   Labs/ tests ordered today include:   No orders of the defined types were placed in this encounter.   Patient Instructions  Medication Instructions:  No changes *If you need a refill on your cardiac medications before your next appointment, please call your pharmacy*   Lab Work: None ordered If you have labs (blood work) drawn today and your tests are completely normal, you  will receive your results only by: [name] MyChart Message (if you have MyChart) OR . A paper copy in the mail If you have any lab test that is abnormal or we need to change your treatment, we will call you to review the results.   Testing/Procedures: None ordered   Follow-Up: At [facility], you and your health needs are our priority.  As part of our continuing mission to provide you with exceptional heart care, we have created designated Provider Care Teams.  These Care Teams include your primary Cardiologist (physician) and Advanced Practice Providers (APPs -  Physician Assistants and Nurse Practitioners) who all work together to provide you with the care you need, when you need it.  We recommend signing up for the patient portal called "MyChart".  Sign up information is provided on this After Visit Summary.  MyChart is used to connect with patients for Virtual Visits (Telemedicine).  Patients are able to view lab/test results, encounter notes, upcoming appointments, etc.  Non-urgent messages can be sent to your provider as well.   To learn more about what you can do with MyChart, go to [email or web address].    Your next appointment:   12 month(s)  The format for your next appointment:   In Person  Provider:   You may see Thurmon Fair, MD or one of the following Advanced Practice Providers on your designated Care Team:    Azalee Course, PA-C  Micah Flesher, [place]  or   Judy Pimple, PA-C        Signed, Thurmon Fair, MD  02/12/2020 8:46 AM    Thurmon Fair, MD, [facility] [phone] office [phone] pager

## 2020-02-24 NOTE — Addendum Note (Signed)
Addended by: Brunetta Genera on: 02/24/2020 04:44 PM   Modules accepted: Orders

## 2020-03-08 ENCOUNTER — Other Ambulatory Visit: Payer: Self-pay | Admitting: Cardiovascular Disease

## 2020-03-09 ENCOUNTER — Ambulatory Visit: Payer: 59 | Attending: Orthopedic Surgery | Admitting: Physical Therapy

## 2020-03-09 ENCOUNTER — Encounter: Payer: Self-pay | Admitting: Physical Therapy

## 2020-03-09 ENCOUNTER — Other Ambulatory Visit: Payer: Self-pay

## 2020-03-09 DIAGNOSIS — R293 Abnormal posture: Secondary | ICD-10-CM | POA: Diagnosis present

## 2020-03-09 DIAGNOSIS — M5441 Lumbago with sciatica, right side: Secondary | ICD-10-CM | POA: Insufficient documentation

## 2020-03-09 NOTE — Therapy (Signed)
[facility] [address] [place], [place], [address] Phone: [phone]   Fax:  [phone]  Physical Therapy Evaluation  Patient Details  Name: Craig Copeland MRN: [id number] Date of Birth: 1956-01-09 Referring Provider (PT): Venita Lick, MD   Encounter Date: 03/09/2020   PT End of Session - 03/09/20 1248    Visit Number 1    Number of Visits 8    Date for PT Re-Evaluation 04/13/20    Authorization Type FOTO to be completed next visit    PT Start Time 1030    PT Stop Time 1110    PT Time Calculation (min) 40 min    Activity Tolerance Patient tolerated treatment well    Behavior During Therapy [facility] for tasks assessed/performed           Past Medical History:  Diagnosis Date  . CAD (coronary artery disease)   . Dizziness   . Dyslipidemia   . Flu 06/2016   Had again 01/208 with PNA  . Obesity   . OSA (obstructive sleep apnea)   . Systemic hypertension   . Vasospastic angina [facility])     Past Surgical History:  Procedure Laterality Date  . CARDIAC CATHETERIZATION  12/02/2002   <20% restenosis RCA  . CARDIAC CATHETERIZATION  01/30/2010   20-30% in-stent restenosis,60-70% ostial stenosis  . CORONARY ANGIOPLASTY WITH STENT PLACEMENT  11/13/2002   mid RCA  . KNEE SURGERY  2003  . LEFT HEART CATH AND CORONARY ANGIOGRAPHY N/A 09/05/2016   Procedure: Left Heart Cath and Coronary Angiography;  Surgeon: Kathleene Hazel, MD;  Location: [facility] INVASIVE CV LAB;  Service: Cardiovascular;  Laterality: N/A;  . LEFT HEART CATHETERIZATION WITH CORONARY ANGIOGRAM N/A 12/22/2012   Procedure: LEFT HEART CATHETERIZATION WITH CORONARY ANGIOGRAM;  Surgeon: Thurmon Fair, MD;  Location: [facility] CATH LAB;  Service: Cardiovascular;  Laterality: N/A;  . NOSE SURGERY    . TOTAL KNEE ARTHROPLASTY Left 03/22/2017   Procedure: LEFT TOTAL KNEE ARTHROPLASTY;  Surgeon: Eugenia Mcalpine, MD;  Location: [facility] ORS;  Service: Orthopedics;  Laterality: Left;  [name]  VASECTOMY      There were no vitals filed for this visit.    Subjective Assessment - 03/09/20 1235    Subjective COVID 19 screening performed on patient upon arrival. Patient arrives to physical therapy with reports of low back pain that began about 1 month ago due to an unknown cause. Patient reported pain with neurological symptoms to anterior right thigh with standing greater than 5 minutes. Patient reports improvement in pain and function since taking methylprednisolone. Patient reports pain at worst prior to medication was 10/10 and pain at best as 0/10 with sitting. Patient's goals are to decrease pain, improve movement, improve strength, and improve ability to perform ADLs and home activities.    Pertinent History CAD, left TKA (03/22/17).    How long can you walk comfortably? Variable.    Patient Stated Goals Decrease pain and swelling and avoid surgery.    Currently in Pain? Yes    Pain Location Back    Pain Orientation Left    Pain Descriptors / Indicators Discomfort    Pain Type Acute pain    Pain Onset More than a month ago    Pain Frequency Intermittent    Aggravating Factors  standing    Pain Relieving Factors sitting    Effect of Pain on Daily Activities certain activities involving standing              [facility]  PT Assessment - 03/09/20 0001      Assessment   Medical Diagnosis low back pain    Referring Provider (PT) Venita Lick, MD    Onset Date/Surgical Date --   "one month"   Hand Dominance Right    Next MD Visit to be scheduled    Prior Therapy yes, for left TKA      Precautions   Precautions None      Restrictions   Weight Bearing Restrictions No      Balance Screen   Has the patient fallen in the past 6 months No    How many times? 0    Has the patient had a decrease in activity level because of a fear of falling?  No    Is the patient reluctant to leave their home because of a fear of falling?  No      Home Environment   Living Environment  Private residence      Prior Function   Level of Independence Independent      Observation/Other Assessments   Focus on Therapeutic Outcomes (FOTO)  to be completed next visit      Posture/Postural Control   Posture/Postural Control Postural limitations    Postural Limitations Rounded Shoulders;Forward head;Decreased thoracic kyphosis;Weight shift left      ROM / Strength   AROM / PROM / Strength AROM;Strength      AROM   AROM Assessment Site Lumbar    Lumbar Flexion 19.25 finger tip to floor    Lumbar Extension 6 degrees    Lumbar - Right Side Bend 21.0 finger tip to floor    Lumbar - Left Side Bend 24.5 finger tip to floor      Strength   Strength Assessment Site Hip;Knee    Right/Left Hip Left;Right    Right Hip Flexion 4+/5    Right Hip Extension 4+/5    Right Hip ABduction 4+/5    Left Hip Flexion 4+/5    Right/Left Knee Right;Left    Right Knee Flexion 4+/5    Right Knee Extension 4+/5    Left Knee Flexion 4+/5    Left Knee Extension 4+/5      Flexibility   Soft Tissue Assessment /Muscle Length yes    Hamstrings decreased right HS comparison to left      Palpation   Palpation comment increased tone to right QL and paraspinals, slight tenderness to distal ITB and lateral quad      Transfers   Transfers Independent with all Transfers      Ambulation/Gait   Gait Pattern Step-through pattern;Decreased stance time - left;Decreased stance time - right;Wide base of support                      Objective measurements completed on examination: See above findings.               PT Education - 03/09/20 1247    Education Details draw in, bridges, mini squats, figure 4 stretch, and SKTC stretch    Person(s) Educated Patient    Methods Explanation;Demonstration;Handout    Comprehension Verbalized understanding;Returned demonstration               PT Long Term Goals - 03/09/20 1256      PT LONG TERM GOAL #1   Title Patient will be  independent with HEP    Time 4    Period Weeks    Status New      PT  LONG TERM GOAL #2   Title Patient will report ability to perform home activities an ADLs with low back pain less than or equal to 2/10.    Baseline --    Time 4    Period Weeks    Status New      PT LONG TERM GOAL #3   Title Patient will report a centralization of neurogical symptoms to R low back or report an elimination of symptoms to indicate decreased nerve irritation.    Baseline --    Time 4    Period Weeks    Status New      PT LONG TERM GOAL #4   Title Patient will report ability to stand for 2 hrs or greater with low back pain less than or equal to 2/10.    Time 4    Period Weeks    Status New                  Plan - 03/09/20 1249    Clinical Impression Statement Patient is a 64 year old male who presents to physical therapy with right sided low back pain with right neuological symptoms to right anterior thigh, decreased lumbar AROM, and abnormal posture that began about 1 month ago. Patient with notable right sided lumbar paraspinal and QL tone upon palpation. Patient WFL LE MMT bilaterally. Patient and PT discussed plan of care and HEP to which he reported understanding. Patient will be away for 2-3 weeks and FOTO will be perfromed then. Patient would benefit from skilled physical therapy to address deficits and patient's goals.    Personal Factors and Comorbidities Comorbidity 1    Comorbidities CAD, left TKA (03/22/17)    Examination-Activity Limitations Other;Stand    Examination-Participation Restrictions Other;Cleaning;Yard Work    Public house manager Low    Rehab Potential Good    PT Frequency 2x / week    PT Duration 4 weeks    PT Treatment/Interventions ADLs/Self Care Home Management;Cryotherapy;Electrical Stimulation;Moist Heat;Functional mobility training;Therapeutic activities;Therapeutic exercise;Neuromuscular  re-education;Manual techniques;Patient/family education;Passive range of motion;Vasopneumatic Device;Ultrasound;Balance training;Dry needling    PT Next Visit Plan progress HEP; nustep, core stability, LE strengthening, body mechanics for ADLs; Modalities as needed.    Consulted and Agree with Plan of Care Patient           Patient will benefit from skilled therapeutic intervention in order to improve the following deficits and impairments:  Pain, Decreased activity tolerance, Decreased range of motion, Postural dysfunction  Visit Diagnosis: Acute right-sided low back pain with right-sided sciatica  Abnormal posture     Problem List Patient Active Problem List   Diagnosis Date Noted  . Osteoarthritis of right knee 03/22/2017  . S/P knee replacement 03/22/2017  . Chest pain with high risk for cardiac etiology 08/28/2016  . Murmur, cardiac 04/01/2015  . Angina pectoris syndrome (HCC) 12/18/2012  . CAD (coronary artery disease) 12/18/2012  . Hyperlipidemia 12/18/2012  . Essential hypertension 12/18/2012  . Morbid obesity (HCC) 12/18/2012  . Obstructive sleep apnea 12/18/2012    Guss Bunde, PT, DPT 03/09/2020, 2:47 PM  [facility] [address] [place], [place], [address] Phone: [phone]   Fax:  [phone]  Name: MELCHIZEDEK ESPINOLA MRN: [id number] Date of Birth: 06/22/1956

## 2020-04-02 ENCOUNTER — Other Ambulatory Visit: Payer: Self-pay | Admitting: Cardiovascular Disease

## 2020-04-06 ENCOUNTER — Ambulatory Visit: Payer: 59 | Admitting: Physical Therapy

## 2020-05-30 ENCOUNTER — Encounter: Payer: Self-pay | Admitting: Pulmonary Disease

## 2020-05-30 ENCOUNTER — Other Ambulatory Visit: Payer: Self-pay

## 2020-05-30 ENCOUNTER — Ambulatory Visit (INDEPENDENT_AMBULATORY_CARE_PROVIDER_SITE_OTHER): Payer: 59 | Admitting: Pulmonary Disease

## 2020-05-30 VITALS — BP 130/74 | HR 72 | Temp 98.2°F | Ht 73.0 in | Wt 309.6 lb

## 2020-05-30 DIAGNOSIS — G4733 Obstructive sleep apnea (adult) (pediatric): Secondary | ICD-10-CM

## 2020-05-30 NOTE — Progress Notes (Signed)
Craig Copeland    [id number]    [birth year]  Primary Care Physician:Pharr, Zollie Beckers, MD  Referring Physician: [name], MD [address] [place],  [place] [address]  Chief complaint:   Patient with a history of obstructive sleep apnea -Intolerant of CPAP   HPI:  Diagnosed with obstructive sleep apnea over 17 years ago Tolerated CPAP for a long time Changed machine and mask a few years ago Last study was 10 to 12 years ago  Has not been able to use CPAP recently secondary to not finding a right mask A lot of mask leaks  Had question about an inspire device and possible evaluation for 1  Usually goes to bed about 9:30 PM Wake up time about 7 AM Takes about 5 minutes to fall asleep 2-3 awakenings  Weight is about stable  He does have dryness of his mouth in the mornings No headaches in the mornings No memory issues  No family history of obstructive sleep apnea  Pets: Occupation: Exposures: Smoking history: Travel history: Relevant family history:  Outpatient Encounter Medications as of 05/30/2020  Medication Sig  . amLODipine (NORVASC) 5 MG tablet TAKE 1 TABLET BY MOUTH EVERY DAY IN THE EVENING  . aspirin EC 81 MG tablet Take 81 mg by mouth daily.  . cholecalciferol (VITAMIN D) 1000 units tablet Take 1,000 Units by mouth daily.  . clopidogrel (PLAVIX) 75 MG tablet Take 75 mg by mouth daily.  [name] ezetimibe (ZETIA) 10 MG tablet Take 10 mg by mouth every evening.   . fish oil-omega-3 fatty acids 1000 MG capsule Take 1 g by mouth daily.   . isosorbide mononitrate (IMDUR) 30 MG 24 hr tablet TAKE 2 TABLETS BY MOUTH DAILY  . losartan (COZAAR) 100 MG tablet Take 0.5 tablets (50 mg total) by mouth daily.  . Melatonin 10 MG CAPS Take 10 mg by mouth at bedtime.  . meloxicam (MOBIC) 15 MG tablet Take 15 mg by mouth 3 (three) times daily.  . methylPREDNISolone (MEDROL DOSEPAK) 4 MG TBPK tablet Take by mouth.  . nitroGLYCERIN (NITROSTAT) 0.4 MG  SL tablet Place 1 tablet (0.4 mg total) under the tongue every 5 (five) minutes as needed for chest pain.  . pravastatin (PRAVACHOL) 80 MG tablet Take 80 mg by mouth at bedtime.    No facility-administered encounter medications on file as of 05/30/2020.    Allergies as of 05/30/2020  . (No Known Allergies)    Past Medical History:  Diagnosis Date  . CAD (coronary artery disease)   . Dizziness   . Dyslipidemia   . Flu 06/2016   Had again 01/208 with PNA  . Obesity   . OSA (obstructive sleep apnea)   . Systemic hypertension   . Vasospastic angina [facility])     Past Surgical History:  Procedure Laterality Date  . CARDIAC CATHETERIZATION  12/02/2002   <20% restenosis RCA  . CARDIAC CATHETERIZATION  01/30/2010   20-30% in-stent restenosis,60-70% ostial stenosis  . CORONARY ANGIOPLASTY WITH STENT PLACEMENT  11/13/2002   mid RCA  . KNEE SURGERY  2003  . LEFT HEART CATH AND CORONARY ANGIOGRAPHY N/A 09/05/2016   Procedure: Left Heart Cath and Coronary Angiography;  Surgeon: Kathleene Hazel, MD;  Location: [facility] INVASIVE CV LAB;  Service: Cardiovascular;  Laterality: N/A;  . LEFT HEART CATHETERIZATION WITH CORONARY ANGIOGRAM N/A 12/22/2012   Procedure: LEFT HEART CATHETERIZATION WITH CORONARY ANGIOGRAM;  Surgeon: Thurmon Fair, MD;  Location: [facility] CATH  LAB;  Service: Cardiovascular;  Laterality: N/A;  . NOSE SURGERY    . TOTAL KNEE ARTHROPLASTY Left 03/22/2017   Procedure: LEFT TOTAL KNEE ARTHROPLASTY;  Surgeon: Eugenia Mcalpine, MD;  Location: [facility] ORS;  Service: Orthopedics;  Laterality: Left;  [name] VASECTOMY      Family History  Problem Relation Age of Onset  . Heart failure Father   . Hypertension Father   . Heart failure Maternal Grandmother   . Stroke Paternal Grandmother   . Cancer Paternal Grandfather   . Alzheimer's disease Paternal Grandfather     Social History   Socioeconomic History  . Marital status: Married    Spouse name: Not on file  . Number of children: 2  . Years  of education: 12th  . Highest education level: Not on file  Occupational History  . Occupation: Optometrist: [employer]  . 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 Smoking status: Never Smoker  . Smokeless tobacco: Never Used  Substance and Sexual Activity  . Alcohol use: Yes    Alcohol/week: 1.0 standard drink    Types: 1 Cans of beer per week    Comment: Weekly  . Drug use: No  . Sexual activity: Not on file  Other Topics Concern  . Not on file  Social History Narrative   Patient is married with two children.   Patient is right handed.   Patient has 12th grade education.   Patient does not drink caffeine.   Social Determinants of Health   Financial Resource Strain:   . Difficulty of Paying Living Expenses: Not on file  Food Insecurity:   . Worried About Programme researcher, broadcasting/film/video in the Last Year: Not on file  . Ran Out of Food in the Last Year: Not on file  Transportation Needs:   . Lack of Transportation (Medical): Not on file  . Lack of Transportation (Non-Medical): Not on file  Physical Activity:   . Days of Exercise per Week: Not on file  . Minutes of Exercise per Session: Not on file  Stress:   . Feeling of Stress : Not on file  Social Connections:   . Frequency of Communication with Friends and Family: Not on file  . Frequency of Social Gatherings with Friends and Family: Not on file  . Attends Religious Services: Not on file  . Active Member of Clubs or Organizations: Not on file  . Attends Banker Meetings: Not on file  . Marital Status: Not on file  Intimate Partner Violence:   . Fear of Current or Ex-Partner: Not on file  . Emotionally Abused: Not on file  . Physically Abused: Not on file  . Sexually Abused: Not on file    Review of Systems  Constitutional: Positive for fatigue.  Respiratory: Positive for apnea. Negative for shortness of breath.   Psychiatric/Behavioral: Positive for  sleep disturbance.    Vitals:   05/30/20 1104  BP: 130/74  Pulse: 72  Temp: 98.2 F (36.8 C)  SpO2: 99%     Physical Exam Constitutional:      Appearance: He is obese.  HENT:     Nose: No congestion.     Mouth/Throat:     Mouth: Mucous membranes are moist.     Comments: Mallampati 3, crowded oropharynx Eyes:     General:        Right eye: No discharge.        Left  eye: No discharge.     Pupils: Pupils are equal, round, and reactive to light.  Cardiovascular:     Rate and Rhythm: Normal rate and regular rhythm.     Heart sounds: No murmur heard.  No friction rub.  Pulmonary:     Effort: No respiratory distress.     Breath sounds: No stridor. No wheezing or rhonchi.  Musculoskeletal:     Cervical back: No rigidity or tenderness.  Neurological:     Mental Status: He is alert.  Psychiatric:        Mood and Affect: Mood normal.    Results of the Epworth flowsheet 05/30/2020  Sitting and reading 0  Watching TV 1  Sitting, inactive in a public place (e.g. a theatre or a meeting) 0  As a passenger in a car for an hour without a break 1  Lying down to rest in the afternoon when circumstances permit 0  Sitting and talking to someone 0  Sitting quietly after a lunch without alcohol 0  In a car, while stopped for a few minutes in traffic 0  Total score 2    Data Reviewed Previous sleep study not available for review  Assessment:  History of obstructive sleep apnea -Severity is unknown -Intolerant of CPAP at present-has not been able to find a mask that works well  Questions about inspire device -This was discussed  Other options of treatment discussed  Risks of not treating sleep disordered breathing discussed  Plan/Recommendations: We will schedule the patient for home sleep study  We will consider evaluation for an inspire device depending on severity of sleep disordered breathing  Encourage weight loss efforts  Consideration for CPAP therapy if not a  candidate for inspire   Virl Diamond MD [facility] 05/30/2020, 11:09 AM  CC: [name], MD

## 2020-05-30 NOTE — Patient Instructions (Signed)
History of obstructive sleep apnea  We will schedule you for home sleep study Depending on results, I will refer you for evaluation for an inspire device  If you are not a good candidate for the inspire device, then CPAP therapy has to be reconsidered  I will see you back in 3 months  Call with significant concerns  We will update you as soon as the sleep studies reviewed Sleep Apnea Sleep apnea affects breathing during sleep. It causes breathing to stop for a short time or to become shallow. It can also increase the risk of:  Heart attack.  Stroke.  Being very overweight (obese).  Diabetes.  Heart failure.  Irregular heartbeat. The goal of treatment is to help you breathe normally again. What are the causes? There are three kinds of sleep apnea:  Obstructive sleep apnea. This is caused by a blocked or collapsed airway.  Central sleep apnea. This happens when the brain does not send the right signals to the muscles that control breathing.  Mixed sleep apnea. This is a combination of obstructive and central sleep apnea. The most common cause of this condition is a collapsed or blocked airway. This can happen if:  Your throat muscles are too relaxed.  Your tongue and tonsils are too large.  You are overweight.  Your airway is too small. What increases the risk?  Being overweight.  Smoking.  Having a small airway.  Being older.  Being male.  Drinking alcohol.  Taking medicines to calm yourself (sedatives or tranquilizers).  Having family members with the condition. What are the signs or symptoms?  Trouble staying asleep.  Being sleepy or tired during the day.  Getting angry a lot.  Loud snoring.  Headaches in the morning.  Not being able to focus your mind (concentrate).  Forgetting things.  Less interest in sex.  Mood swings.  Personality changes.  Feelings of sadness (depression).  Waking up a lot during the night to pee  (urinate).  Dry mouth.  Sore throat. How is this diagnosed?  Your medical history.  A physical exam.  A test that is done when you are sleeping (sleep study). The test is most often done in a sleep lab but may also be done at home. How is this treated?   Sleeping on your side.  Using a medicine to get rid of mucus in your nose (decongestant).  Avoiding the use of alcohol, medicines to help you relax, or certain pain medicines (narcotics).  Losing weight, if needed.  Changing your diet.  Not smoking.  Using a machine to open your airway while you sleep, such as: ? An oral appliance. This is a mouthpiece that shifts your lower jaw forward. ? A CPAP device. This device blows air through a mask when you breathe out (exhale). ? An EPAP device. This has valves that you put in each nostril. ? A BPAP device. This device blows air through a mask when you breathe in (inhale) and breathe out.  Having surgery if other treatments do not work. It is important to get treatment for sleep apnea. Without treatment, it can lead to:  High blood pressure.  Coronary artery disease.  In men, not being able to have an erection (impotence).  Reduced thinking ability. Follow these instructions at home: Lifestyle  Make changes that your doctor recommends.  Eat a healthy diet.  Lose weight if needed.  Avoid alcohol, medicines to help you relax, and some pain medicines.  Do not use any  products that contain nicotine or tobacco, such as cigarettes, e-cigarettes, and chewing tobacco. If you need help quitting, ask your doctor. General instructions  Take over-the-counter and prescription medicines only as told by your doctor.  If you were given a machine to use while you sleep, use it only as told by your doctor.  If you are having surgery, make sure to tell your doctor you have sleep apnea. You may need to bring your device with you.  Keep all follow-up visits as told by your doctor.  This is important. Contact a doctor if:  The machine that you were given to use during sleep bothers you or does not seem to be working.  You do not get better.  You get worse. Get help right away if:  Your chest hurts.  You have trouble breathing in enough air.  You have an uncomfortable feeling in your back, arms, or stomach.  You have trouble talking.  One side of your body feels weak.  A part of your face is hanging down. These symptoms may be an emergency. Do not wait to see if the symptoms will go away. Get medical help right away. Call your local emergency services (911 in the [place] Do not drive yourself to the hospital. Summary  This condition affects breathing during sleep.  The most common cause is a collapsed or blocked airway.  The goal of treatment is to help you breathe normally while you sleep. This information is not intended to replace advice given to you by your health care provider. Make sure you discuss any questions you have with your health care provider. Document Revised: 04/04/2018 Document Reviewed: 02/11/2018 Elsevier Patient Education  2020 [employer].

## 2020-07-06 ENCOUNTER — Other Ambulatory Visit: Payer: Self-pay

## 2020-07-06 ENCOUNTER — Ambulatory Visit: Payer: 59

## 2020-07-06 DIAGNOSIS — G4733 Obstructive sleep apnea (adult) (pediatric): Secondary | ICD-10-CM | POA: Diagnosis not present

## 2020-07-13 ENCOUNTER — Telehealth: Payer: Self-pay | Admitting: Pulmonary Disease

## 2020-07-13 DIAGNOSIS — G4733 Obstructive sleep apnea (adult) (pediatric): Secondary | ICD-10-CM | POA: Diagnosis not present

## 2020-07-13 NOTE — Telephone Encounter (Signed)
Call patient  Sleep study result  Date of study: 07/06/2020  Impression: Severity severe obstructive sleep apnea Moderate oxygen desaturations  Recommendation: Will recommend CPAP therapy for severe obstructive sleep apnea  Auto titrating CPAP with pressure settings of 5-20 will be appropriate  Patient had difficulty with tolerating CPAP in the past, can be evaluated for an inspire device as requested by the patient -Relative limitations will include his AHI and his weight  If he chooses CPAP then, DME referral for CPAP initiation  May be referred to be evaluated for an inspire device  Follow-up in  4 to 6 weeks post initiation of therapy

## 2020-07-14 NOTE — Telephone Encounter (Signed)
Called spoke with patient.  Let him know Dr. Trena Platt recommendations.   Order placed to ENT for evaluation of inspire if that does not work we will have to place order for CPAP.  Told patient to make a 6 week follow up after getting device or machine.  Nothing further needed at this time.

## 2020-10-24 ENCOUNTER — Other Ambulatory Visit: Payer: Self-pay | Admitting: Cardiovascular Disease

## 2021-01-25 ENCOUNTER — Other Ambulatory Visit: Payer: Self-pay | Admitting: Cardiovascular Disease

## 2021-06-01 ENCOUNTER — Encounter: Payer: Self-pay | Admitting: Cardiovascular Disease

## 2021-06-01 ENCOUNTER — Ambulatory Visit (INDEPENDENT_AMBULATORY_CARE_PROVIDER_SITE_OTHER): Payer: 59 | Admitting: Cardiovascular Disease

## 2021-06-01 ENCOUNTER — Other Ambulatory Visit: Payer: Self-pay

## 2021-06-01 VITALS — BP 142/80 | HR 66 | Ht 73.0 in | Wt 310.2 lb

## 2021-06-01 DIAGNOSIS — I25118 Atherosclerotic heart disease of native coronary artery with other forms of angina pectoris: Secondary | ICD-10-CM | POA: Diagnosis not present

## 2021-06-01 DIAGNOSIS — E785 Hyperlipidemia, unspecified: Secondary | ICD-10-CM

## 2021-06-01 DIAGNOSIS — Z9989 Dependence on other enabling machines and devices: Secondary | ICD-10-CM

## 2021-06-01 DIAGNOSIS — I358 Other nonrheumatic aortic valve disorders: Secondary | ICD-10-CM

## 2021-06-01 DIAGNOSIS — G4733 Obstructive sleep apnea (adult) (pediatric): Secondary | ICD-10-CM

## 2021-06-01 DIAGNOSIS — I10 Essential (primary) hypertension: Secondary | ICD-10-CM | POA: Diagnosis not present

## 2021-06-01 NOTE — Progress Notes (Signed)
Patient ID: Craig Copeland, male   DOB: [birth year] Cop[birth year], 65 y.o.   MRN: [id number]     Cardiology Office Note   Date:  06/03/2021   ID:  Craig Copeland, Craig Copeland, Craig Copeland, MRN [id number]  PCP:  [name], MD  Cardiologist:   Thurmon Fair, MD   Chief Complaint  Patient presents with   Coronary Artery Disease     History of Present Illness: Craig Copeland is a 65 y.o. male who presents for follow-up for coronary artery disease, hypertension and hyperlipidemia.  He has done quite well from a cardiovascular point of view.  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  He can do this without limitations related to chest pain or shortness of breath.  Biggest limitation remains pain in his left knee and bilateral hips due to arthritis.  He has had occasional episodes of chest discomfort over the last year but have not always been associated with physical activity.  He estimates that he is taken nitroglycerin maybe 6 times in the last 12 months and only on 1 occasion had to take 2 consecutive sublingual tablets.  He has not had palpitations, dizziness, syncope, change in his pattern of mild right ankle edema, orthopnea, PND, intermittent claudication.  He reports compliance with CPAP.  He denies daytime hypersomnolence.  He has gained weight.  He tried to take [facility] but had to stop it due to GI side effects.  He is morbidly obese with a BMI of almost 41.   He last underwent cardiac catheterization on September 05, 2016. The previously placed stents in his right coronary artery were widely patent and the only significant stenosis was an ostial 70% lesion in a relatively small caliber ramus intermedius artery. No revascularization was performed.  He seems to be tolerating pravastatin without problems. He was not tolerant of either rosuvastatin or atorvastatin.            He has a history of previous stent to the right coronary artery(2004,  drug-eluting 3.0 x 33 mm Cypher stent overlapping 3.0 x 23 mm Cypher stent in the mid to proximal right coronary artery) and a moderate to severe ostial lesion of a small to medium ramus intermedius artery (too small for percutaneous revascularization). No change on cardiac cath performed in 2018. 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            His last cardiac catheterization was in March 2018, that showed findings identical to 2014, 2011 and 2004. He has normal left ventricle systolic function by echo most recently performed in October 2016; EF 50-55% by LV angiography in March 2018.  History of hypertension and hyperlipidemia. He is morbidly obese. He is compliant with CPAP for obstructive sleep apnea. He is on chronic treatment with aspirin and clopidogrel.    Past Medical History:  Diagnosis Date   CAD (coronary artery disease)    Dizziness    Dyslipidemia    Flu 06/2016   Had again 01/208 with PNA   Obesity    OSA (obstructive sleep apnea)    Systemic hypertension    Vasospastic angina [facility])     Past Surgical History:  Procedure Laterality Date   CARDIAC CATHETERIZATION  12/02/2002   <20% restenosis RCA   CARDIAC CATHETERIZATION  01/30/2010   20-30% in-stent restenosis,60-70% ostial stenosis   CORONARY ANGIOPLASTY WITH STENT PLACEMENT  11/13/2002  mid RCA   KNEE SURGERY  2003   LEFT HEART CATH AND CORONARY ANGIOGRAPHY N/A 09/05/2016   Procedure: Left Heart Cath and Coronary Angiography;  Surgeon: Kathleene Hazel, MD;  Location: [facility] INVASIVE CV LAB;  Service: Cardiovascular;  Laterality: N/A;   LEFT HEART CATHETERIZATION WITH CORONARY ANGIOGRAM N/A 12/22/2012   Procedure: LEFT HEART CATHETERIZATION WITH CORONARY ANGIOGRAM;  Surgeon: Thurmon Fair, MD;  Location: [facility] CATH LAB;  Service: Cardiovascular;  Laterality: N/A;   NOSE SURGERY     TOTAL KNEE ARTHROPLASTY Left 03/22/2017   Procedure: LEFT TOTAL KNEE  ARTHROPLASTY;  Surgeon: Eugenia Mcalpine, MD;  Location: [facility] ORS;  Service: Orthopedics;  Laterality: Left;   VASECTOMY       Current Outpatient Medications  Medication Sig Dispense Refill   amLODipine (NORVASC) 5 MG tablet TAKE 1 TABLET BY MOUTH EVERY DAY IN THE EVENING. PATIENT NEEDS TO SCHEDULE AN APPOINTMENT WITH CARDIOLOGIST IN ORDER TO RECEIVE FUTURE REFILLS.**FIRST ATTEMPT** 90 tablet 1   aspirin EC 81 MG tablet Take 81 mg by mouth daily.     cholecalciferol (VITAMIN D) 1000 units tablet Take 1,000 Units by mouth daily.     clopidogrel (PLAVIX) 75 MG tablet Take 75 mg by mouth daily.     ezetimibe (ZETIA) 10 MG tablet Take 10 mg by mouth every evening.      fish oil-omega-3 fatty acids 1000 MG capsule Take 1 g by mouth daily.      isosorbide mononitrate (IMDUR) 30 MG 24 hr tablet TAKE 2 TABLETS BY MOUTH DAILY 180 tablet 1   losartan (COZAAR) 100 MG tablet Take 0.5 tablets (50 mg total) by mouth daily. 90 tablet 3   Melatonin 10 MG CAPS Take 10 mg by mouth at bedtime.     methylPREDNISolone (MEDROL DOSEPAK) 4 MG TBPK tablet Take 4 mg by mouth daily in the afternoon.     pravastatin (PRAVACHOL) 80 MG tablet Take 80 mg by mouth at bedtime.      meloxicam (MOBIC) 15 MG tablet Take 15 mg by mouth 3 (three) times daily. (Patient not taking: Reported on 06/01/2021)     nitroGLYCERIN (NITROSTAT) 0.4 MG SL tablet Place 1 tablet (0.4 mg total) under the tongue every 5 (five) minutes as needed for chest pain. (Patient not taking: Reported on 06/01/2021) 90 tablet 0   No current facility-administered medications for this visit.    Allergies:   Patient has no known allergies.    Social History:  The patient  reports that he has never smoked. He has never used smokeless tobacco. He reports current alcohol use of about 1.0 standard drink per week. He reports that he does not use drugs.   Family History:  The patient's family history includes Alzheimer's disease in his paternal grandfather; Cancer in  his paternal grandfather; Heart failure in his father and maternal grandmother; Hypertension in his father; Stroke in his paternal grandmother.    ROS:  Please see the history of present illness.  He denies shortness of breath at rest or with activity, orthopnea, PND, claudication, focal neurological complaints.  All other systems are reviewed and are negative.  PHYSICAL EXAM: VS:  BP (!) 142/80 (BP Location: Left Arm, Patient Position: Sitting, Cuff Size: Large)   Pulse 66   Ht 6\' 1"  (1.854 m)   Wt (!) 310 lb 3.2 oz (140.7 kg)   SpO2 97%   BMI 40.93 kg/m  , BMI Body mass index is 40.93 kg/m.  General: Alert, oriented x3, no distress,  morbidly obese Head: no evidence of trauma, PERRL, EOMI, no exophtalmos or lid lag, no myxedema, no xanthelasma; normal ears, nose and oropharynx Neck: normal jugular venous pulsations and no hepatojugular reflux; brisk carotid pulses without delay and no carotid bruits Chest: clear to auscultation, no signs of consolidation by percussion or palpation, normal fremitus, symmetrical and full respiratory excursions Cardiovascular: normal position and quality of the apical impulse, regular rhythm, normal first and second heart sounds, no murmurs, rubs or gallops Abdomen: no tenderness or distention, no masses by palpation, no abnormal pulsatility or arterial bruits, normal bowel sounds, no hepatosplenomegaly Extremities: no clubbing, cyanosis or edema; 2+ radial, ulnar and brachial pulses bilaterally; 2+ right femoral, posterior tibial and dorsalis pedis pulses; 2+ left femoral, posterior tibial and dorsalis pedis pulses; no subclavian or femoral bruits Neurological: grossly nonfocal Psych: Normal mood and affect   EKG: Ordered today, shows sinus rhythm with first-degree AV block, old inferior Q waves, no acute ischemic repolarization abnormalities QTC 417 ms Recent Labs: 10/24/2020 Creatinine 0.94, ALT 54, glucose 114 Lipid Panel     Component Value  Date/Time   CHOL 137 04/08/2015 0804   TRIG 157 (H) 04/08/2015 0804   HDL 32 (L) 04/08/2015 0804   CHOLHDL 4.3 04/08/2015 0804   VLDL 31 (H) 04/08/2015 0804   LDLCALC 74 04/08/2015 0804    February 20, 2018 Total cholesterol 117, HDL 32, triglycerides 108, calculated LDL 67.  9 08 2020 Total cholesterol 119, HDL 34, triglycerides 72 Creatinine 0.7, normal liver function test  04/26/2021 Cholesterol 122, HDL 33, LDL 70, triglycerides 93  Wt Readings from Last 3 Encounters:  06/01/21 (!) 310 lb 3.2 oz (140.7 kg)  05/30/20 (!) 309 lb 9.6 oz (140.4 kg)  02/12/20 (!) 309 lb (140.2 kg)     ASSESSMENT AND PLAN:  1. Coronary artery disease of native artery of native heart with stable angina pectoris (HCC)   2. Dyslipidemia (high LDL; low HDL)   3. Essential hypertension   4. Morbid obesity (HCC)   5. OSA on CPAP   6. Aortic valve sclerosis       1. CAD s/p PCI RCA: In addition to fixed CAD, he is also been found to have coronary vasospasm symptoms are generally well controlled with only 6 events in the last 12 months.  He did not tolerate higher doses of beta-blocker due to bradycardia.  On combination amlodipine and nitrates as vasodilator therapy.  On dual antiplatelet therapy and statin. 2. Hyperlipidemia.  On pravastatin and ezetimibe his lipid profile is acceptable.  He did not tolerate more potent statins. Low HDL is a chronic problem that will not improve without substantial weight loss. 3. Hypertension BP is a little high in the office today, but he reports that it is consistently better at, rarely over 130/80. 4. Morbid obesity:  Remains morbidly obese.  Reviewed again those changes in his diet that would be beneficial: the only vegetables he eats are very starchy ones, rice and potatoes. 5. OSA: Reports compliance with CPAP and denies daytime hypersomnolence. 6. Aortic sclerosis/AS:   Echo in 2016 did not show any increased gradients across the aortic valve.  Reviewed the  symptoms of aortic stenosis (exertional dyspnea, worsening exertional angina, exertional syncope).  If these occur he should call us and we will repeat his echo.  Current medicines are reviewed at length with the patient today.   Labs/ tests ordered today include:   Orders Placed This Encounter  Procedures   EKG 12-Lead  Patient Instructions  Medication Instructions:  No changes *If you need a refill on your cardiac medications before your next appointment, please call your pharmacy*   Lab Work: None ordered If you have labs (blood work) drawn today and your tests are completely normal, you will receive your results only by: MyChart Message (if you have MyChart) OR A paper copy in the mail If you have any lab test that is abnormal or we need to change your treatment, we will call you to review the results.   Testing/Procedures: None ordered   Follow-Up: At [facility], you and your health needs are our priority.  As part of our continuing mission to provide you with exceptional heart care, we have created designated Provider Care Teams.  These Care Teams include your primary Cardiologist (physician) and Advanced Practice Providers (APPs -  Physician Assistants and Nurse Practitioners) who all work together to provide you with the care you need, when you need it.  We recommend signing up for the patient portal called "MyChart".  Sign up information is provided on this After Visit Summary.  MyChart is used to connect with patients for Virtual Visits (Telemedicine).  Patients are able to view lab/test results, encounter notes, upcoming appointments, etc.  Non-urgent messages can be sent to your provider as well.   To learn more about what you can do with MyChart, go to [email or web address].    Your next appointment:   12 month(s)  The format for your next appointment:   In Person  Provider:   Thurmon Fair, MD       Signed, Thurmon Fair, MD  06/03/2021 3:01  PM    Thurmon Fair, MD, [facility] [phone] office [phone] pager

## 2021-06-01 NOTE — Patient Instructions (Signed)

## 2021-06-03 ENCOUNTER — Encounter: Payer: Self-pay | Admitting: Cardiovascular Disease

## 2021-06-06 ENCOUNTER — Other Ambulatory Visit: Payer: Self-pay | Admitting: Cardiovascular Disease

## 2021-09-25 ENCOUNTER — Other Ambulatory Visit: Payer: Self-pay | Admitting: Cardiovascular Disease

## 2021-12-04 ENCOUNTER — Other Ambulatory Visit: Payer: Self-pay | Admitting: Cardiovascular Disease

## 2022-04-24 ENCOUNTER — Other Ambulatory Visit: Payer: Self-pay | Admitting: Cardiovascular Disease

## 2022-04-24 NOTE — Telephone Encounter (Signed)
Rx refill sent to pharmacy. 

## 2022-05-01 ENCOUNTER — Other Ambulatory Visit: Payer: Self-pay | Admitting: Cardiovascular Disease

## 2022-05-15 NOTE — Progress Notes (Unsigned)
   I, [name], LAT, ATC acting as a scribe for [name], MD.  Subjective:    CC: R hip, R shoulder, & bilat knee pain  HPI: Pt is a 66 y/o male c/o R hip, R shoulder, & bilat knee pain.  Pt c/o R hip pain x /. Pt locates pain to ?  Low back pain: Radiates: Aggravates: Treatments tried:  Dx testing:  08/28/17 L LE Vasc US  03/28/17 L knee XR   Pertinent review of Systems: ***  Relevant historical information: ***   Objective:   There were no vitals filed for this visit. General: Well Developed, well nourished, and in no acute distress.   MSK: ***  Lab and Radiology Results No results found for this or any previous visit (from the past 72 hour(s)). No results found.    Impression and Recommendations:    Assessment and Plan: 66 y.o. male with ***.  PDMP not reviewed this encounter. No orders of the defined types were placed in this encounter.  No orders of the defined types were placed in this encounter.   Discussed warning signs or symptoms. Please see discharge instructions. Patient expresses understanding.   ***

## 2022-05-16 ENCOUNTER — Ambulatory Visit (INDEPENDENT_AMBULATORY_CARE_PROVIDER_SITE_OTHER): Payer: 59

## 2022-05-16 ENCOUNTER — Ambulatory Visit: Payer: Self-pay

## 2022-05-16 ENCOUNTER — Ambulatory Visit (INDEPENDENT_AMBULATORY_CARE_PROVIDER_SITE_OTHER): Payer: 59 | Admitting: Family Medicine

## 2022-05-16 VITALS — BP 136/84 | HR 71 | Ht 73.0 in | Wt 310.0 lb

## 2022-05-16 DIAGNOSIS — M25551 Pain in right hip: Secondary | ICD-10-CM

## 2022-05-16 DIAGNOSIS — G8929 Other chronic pain: Secondary | ICD-10-CM

## 2022-05-16 DIAGNOSIS — M25511 Pain in right shoulder: Secondary | ICD-10-CM

## 2022-05-16 DIAGNOSIS — M25561 Pain in right knee: Secondary | ICD-10-CM

## 2022-05-16 DIAGNOSIS — M1611 Unilateral primary osteoarthritis, right hip: Secondary | ICD-10-CM | POA: Diagnosis not present

## 2022-05-16 DIAGNOSIS — M25562 Pain in left knee: Secondary | ICD-10-CM

## 2022-05-16 NOTE — Patient Instructions (Addendum)
Thank you for coming in today.   Please get an Xray today before you leave   You received an injection today. Seek immediate medical attention if the joint becomes red, extremely painful, or is oozing fluid.   I've referred you to Physical Therapy.  Let us know if you don't hear from them in one week.   Recheck in 6 weeks.

## 2022-05-17 NOTE — Progress Notes (Signed)
Right hip x-ray shows right hip arthritis.

## 2022-05-17 NOTE — Progress Notes (Signed)
Right shoulder x-ray shows a little bit of spurring at the shoulder joint that could cause some pinching of the rotator cuff.  You have a tiny little bit of arthritis at the small joint at the top of the shoulder the [facility] joint.  No fractures or severe arthritis are visible.

## 2022-06-04 ENCOUNTER — Other Ambulatory Visit: Payer: Self-pay | Admitting: Cardiovascular Disease

## 2022-06-06 ENCOUNTER — Other Ambulatory Visit: Payer: Self-pay

## 2022-06-06 ENCOUNTER — Ambulatory Visit: Payer: 59 | Attending: Family Medicine

## 2022-06-06 DIAGNOSIS — G8929 Other chronic pain: Secondary | ICD-10-CM | POA: Insufficient documentation

## 2022-06-06 DIAGNOSIS — M25511 Pain in right shoulder: Secondary | ICD-10-CM | POA: Diagnosis present

## 2022-06-06 DIAGNOSIS — M25611 Stiffness of right shoulder, not elsewhere classified: Secondary | ICD-10-CM | POA: Diagnosis present

## 2022-06-06 NOTE — Therapy (Signed)
OUTPATIENT PHYSICAL THERAPY SHOULDER EVALUATION   Patient Name: Craig Copeland MRN: [id number] DOB:December 31, 1955, 66 y.o., male Today's Date: 06/06/2022  END OF SESSION:  PT End of Session - 06/06/22 1028     Visit Number 1    Number of Visits 6    Date for PT Re-Evaluation 07/27/22    PT Start Time 1030    PT Stop Time 1110    PT Time Calculation (min) 40 min    Activity Tolerance Patient tolerated treatment well    Behavior During Therapy [facility] for tasks assessed/performed             Past Medical History:  Diagnosis Date   CAD (coronary artery disease)    Dizziness    Dyslipidemia    Flu 06/2016   Had again 01/208 with PNA   Obesity    OSA (obstructive sleep apnea)    Systemic hypertension    Vasospastic angina [facility])    Past Surgical History:  Procedure Laterality Date   CARDIAC CATHETERIZATION  12/02/2002   <20% restenosis RCA   CARDIAC CATHETERIZATION  01/30/2010   20-30% in-stent restenosis,60-70% ostial stenosis   CORONARY ANGIOPLASTY WITH STENT PLACEMENT  11/13/2002   mid RCA   KNEE SURGERY  2003   LEFT HEART CATH AND CORONARY ANGIOGRAPHY N/A 09/05/2016   Procedure: Left Heart Cath and Coronary Angiography;  Surgeon: Burnell Blanks, MD;  Location: [place] CV LAB;  Service: Cardiovascular;  Laterality: N/A;   LEFT HEART CATHETERIZATION WITH CORONARY ANGIOGRAM N/A 12/22/2012   Procedure: LEFT HEART CATHETERIZATION WITH CORONARY ANGIOGRAM;  Surgeon: Sanda Klein, MD;  Location: [place] CATH LAB;  Service: Cardiovascular;  Laterality: N/A;   NOSE SURGERY     TOTAL KNEE ARTHROPLASTY Left 03/22/2017   Procedure: LEFT TOTAL KNEE ARTHROPLASTY;  Surgeon: Sydnee Cabal, MD;  Location: [facility] ORS;  Service: Orthopedics;  Laterality: Left;   VASECTOMY     Patient Active Problem List   Diagnosis Date Noted   Primary osteoarthritis of right hip 05/16/2022   Osteoarthritis of right knee 03/22/2017   S/P knee replacement 03/22/2017   Chest pain with high risk for  cardiac etiology 08/28/2016   Murmur, cardiac 04/01/2015   Angina pectoris syndrome ([place]) 12/18/2012   CAD (coronary artery disease) 12/18/2012   Hyperlipidemia 12/18/2012   Essential hypertension 12/18/2012   Morbid obesity ([place]) 12/18/2012   Obstructive sleep apnea 12/18/2012    PCP: Deland Pretty, MD  REFERRING PROVIDER: Gregor Hams, MD   REFERRING DIAG: Chronic right shoulder pain   THERAPY DIAG:  Stiffness of right shoulder, not elsewhere classified  Acute pain of right shoulder  Rationale for Evaluation and Treatment: Rehabilitation  ONSET DATE: 2 months ago  SUBJECTIVE:  SUBJECTIVE STATEMENT: Patient reports that his right shoulder has been bothering him for about 2 months, but it got worse about a month ago when he was drilling. He noted that the drill "kicked back" and popped his shoulder out. He was able to "pop his shoulder back" himself, but it has been hurting a lot more since then. He had needed surgery previously on his right shoulder due to bone spurs. He is right hand dominant.   PERTINENT HISTORY: HTN, OA, history of falling, and chronic hip and knee pain  PAIN:  Are you having pain? Yes: NPRS scale: 6/10 Pain location: right lateral shoulder Pain description: sharp, stabbing Aggravating factors: reaching overhead and behind his back, right side lying Relieving factors: medication  PRECAUTIONS: Fall  WEIGHT BEARING RESTRICTIONS: No  FALLS:  Has patient fallen in last 6 months? Yes. Number of falls 3; patient reports that most of his falls occur either when he stands up fast as he gets lightheaded or he will miss a step on the ladder. Most recent fall was about 2-3 weeks ago. He suffered no major injuries from these falls.   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 Mild lateral downsloping of the acromion with mild-to-moderate distal lateral subacromial spurring. 2. 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  COGNITION: Overall cognitive status: Within functional limits for tasks assessed     SENSATION: Patient reports no numbness or tingling  UPPER EXTREMITY ROM:   Active ROM Right eval Left eval  Shoulder flexion 113 136  Shoulder extension    Shoulder abduction 115; strain 141  Shoulder adduction    Shoulder internal rotation To right gluteal; painful To T9  Shoulder external rotation To T1 To T3  Elbow flexion    Elbow extension    Wrist flexion    Wrist extension    Wrist ulnar deviation    Wrist radial deviation    Wrist pronation    Wrist supination    (Blank rows = not tested)  UPPER EXTREMITY MMT:  MMT Right eval Left eval  Shoulder flexion 4/5 4/5  Shoulder extension    Shoulder abduction 4/5; painful 4/5  Shoulder adduction    Shoulder internal rotation 5/5 5/5  Shoulder external rotation 5/5 5/5  Middle trapezius    Lower trapezius    Elbow flexion    Elbow extension    Wrist flexion    Wrist extension    Wrist ulnar deviation    Wrist radial deviation    Wrist pronation    Wrist supination    Grip strength (lbs)    (Blank rows = not tested)  JOINT MOBILITY TESTING:  Right AC joint: WFL and nonpainful   PALPATION:  TTP: right long head of the biceps tendon   TODAY'S TREATMENT:  DATE:                                    12/6 EXERCISE LOG  Exercise Repetitions and Resistance  Comments  Wall slides 15 reps   Doorway stretch  2 x 30 seconds   Pulleys 3 minutes            Blank cell = exercise not performed today   PATIENT EDUCATION: Education details: HEP, plan of care, prognosis, x-ray, and healing Person educated: Patient Education method: Explanation Education comprehension: verbalized understanding  HOME EXERCISE PROGRAM: Today's interventions were added to his HEP. He was educated on performing these interventions twice per day.  ASSESSMENT:  CLINICAL IMPRESSION: Patient is a 66 y.o. male who was seen today for physical therapy evaluation and treatment for right shoulder pain and stiffness.  He presented with moderate pain severity and irritability with endrange shoulder flexion and functional internal rotation being the most aggravating to his familiar symptoms.  He also exhibited reduced right shoulder active range of motion compared to the left upper extremity.  Recommend that he continue with skilled physical therapy to address his remaining impairments to maximize his functional mobility.  OBJECTIVE IMPAIRMENTS: decreased activity tolerance, decreased ROM, decreased strength, impaired flexibility, impaired UE functional use, and pain.   ACTIVITY LIMITATIONS: lifting and reach over head  PARTICIPATION LIMITATIONS: community activity and occupation  PERSONAL FACTORS: Past/current experiences, Profession, 3+ comorbidities: HTN, OA, history of falling, and chronic hip and knee pain, and patient reports that he will be out of town for at least the next 2 weeks and will not be able to come to therapy  are also affecting patient's functional outcome.   REHAB POTENTIAL: Fair    CLINICAL DECISION MAKING: Stable/uncomplicated  EVALUATION COMPLEXITY: Low   GOALS: Goals reviewed with patient? Yes  LONG TERM GOALS: Target date: 06/27/22  Patient will be independent with his HEP. Baseline:  Goal status: INITIAL  2.  Patient will demonstrate at  least 120 degrees of active right shoulder flexion for improved function reaching overhead. Baseline:  Goal status: INITIAL  3.  Patient will be able to demonstrate at least 120 degrees of active right shoulder abduction for improved function with overhead activity. Baseline:  Goal status: INITIAL  4.  Patient was able to complete his daily activities without his familiar right shoulder pain exceeding 3/10. Baseline:  Goal status: INITIAL  PLAN:  PT FREQUENCY: 2x/week  PT DURATION: 3 weeks  PLANNED INTERVENTIONS: Therapeutic exercises, Therapeutic activity, Neuromuscular re-education, Patient/Family education, Self Care, Joint mobilization, Dry Needling, Electrical stimulation, Cryotherapy, Moist heat, Vasopneumatic device, Manual therapy, and Re-evaluation  PLAN FOR NEXT SESSION: UBE, pulleys, upper extremity strengthening, manual therapy, modalities as needed   Granville Lewis, PT 06/06/2022, 2:23 PM

## 2022-06-20 NOTE — Progress Notes (Signed)
I, [name], LAT, ATC acting as a scribe for Craig Graham, MD.  Craig Copeland is a 66 y.o. male who presents to [facility] at [facility] today for f/u R hip and R shoulder pain. Pt was last seen by Dr. Denyse Amass on 05/16/22 and was given a R femoral acetabular joint steroid injection and was referred to PT, completing 3 visits.  Today, patient reports R hip is feeling good. Pt reports R shoulder is slightly improved, but the e-stim that was done at PT, made his R shoulder "tight" and "sore."  Dx testing: 05/16/2022 R shoulder & R hip XR  08/28/17 L LE Vasc US             03/28/17 L knee XR  Pertinent review of systems: No fevers or chills  Relevant historical information: Obesity and coronary artery disease.  History of a right shoulder subacromial decompression surgery in the past.   Exam:  BP (!) 140/74   Pulse 74   Ht 6\' 1"  (1.854 m)   Wt (!) 316 lb (143.3 kg)   SpO2 97%   BMI 41.69 kg/m  General: Well Developed, well nourished, and in no acute distress.   MSK: Right shoulder: Normal-appearing Decreased range of motion slightly limited abduction and functional internal rotation. Strength is intact. Mildly positive Hawkins and Neer's test. Positive O'Brien's test.    Lab and Radiology Results  EXAM: RIGHT SHOULDER - 2+ VIEW   COMPARISON:  None Available.   FINDINGS: Mild lateral downsloping of the acromion with mild-to-moderate distal lateral subacromial spurring. Mild-to-moderate acromioclavicular joint space narrowing and peripheral osteophytosis. The glenohumeral joint space is maintained. No acute fracture or dislocation. The visualized portion of the right lung is unremarkable.   IMPRESSION: 1. Mild lateral downsloping of the acromion with mild-to-moderate distal lateral subacromial spurring. 2. 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     Electronically Signed   By: M.D.   On: 05/17/2022  11:08   EXAM: DG HIP (WITH OR WITHOUT PELVIS) 2-3V RIGHT   COMPARISON:  None Available.   FINDINGS: Diffuse decreased bone mineralization. Mild right-greater-than-left femoroacetabular joint space narrowing. Moderate superolateral right acetabular degenerative osteophytosis. On frontal view mild superolateral left acetabular degenerative osteophytosis.   Mild bilateral sacroiliac subchondral sclerosis.   The pubic symphysis joint space is maintained.   No acute fracture or dislocation. Vascular phleboliths overlie the left hemipelvis.   IMPRESSION: Mild right-greater-than-left femoroacetabular osteoarthritis.     Electronically Signed   By: 05/19/2022 M.D.   On: 05/17/2022 11:07 I, 05/19/2022, personally (independently) visualized and performed the interpretation of the images attached in this note.   Assessment and Plan: 66 y.o. male with right shoulder pain following what sounds like a dislocation injury.  He is improving with physical therapy.  He is only had 3 sessions over the last 6 weeks.  Plan to continue physical therapy and check back again in another 6 weeks.  If not improved enough would consider MRI arthrogram as the next step.  The right hip pain is thought to be due to DJD.  That improved significantly with steroid injection and still feeling good.  Check back at the 11-month mark following his injection which should be around February 15.  At that time if needed I could do a steroid injection.    Discussed warning signs or symptoms. Please see discharge instructions. Patient expresses understanding.   The above documentation has been reviewed and is accurate and complete February 17,  M.D.   

## 2022-06-21 ENCOUNTER — Encounter: Payer: Self-pay | Admitting: *Deleted

## 2022-06-21 ENCOUNTER — Ambulatory Visit: Payer: 59 | Admitting: *Deleted

## 2022-06-21 DIAGNOSIS — M25611 Stiffness of right shoulder, not elsewhere classified: Secondary | ICD-10-CM

## 2022-06-21 DIAGNOSIS — M25511 Pain in right shoulder: Secondary | ICD-10-CM

## 2022-06-21 NOTE — Therapy (Signed)
OUTPATIENT PHYSICAL THERAPY SHOULDER TREATMENT   Patient Name: Craig Copeland MRN: [id number] DOB:[birth year], 66 y.o., male Today's Date: 06/21/2022  END OF SESSION:  PT End of Session - 06/21/22 0821     Visit Number 2    Number of Visits 6    Date for PT Re-Evaluation 07/27/22    PT Start Time 0815    PT Stop Time 0901    PT Time Calculation (min) 46 min             Past Medical History:  Diagnosis Date   CAD (coronary artery disease)    Dizziness    Dyslipidemia    Flu 06/2016   Had again 01/208 with PNA   Obesity    OSA (obstructive sleep apnea)    Systemic hypertension    Vasospastic angina [facility])    Past Surgical History:  Procedure Laterality Date   CARDIAC CATHETERIZATION  12/02/2002   <20% restenosis RCA   CARDIAC CATHETERIZATION  01/30/2010   20-30% in-stent restenosis,60-70% ostial stenosis   CORONARY ANGIOPLASTY WITH STENT PLACEMENT  11/13/2002   mid RCA   KNEE SURGERY  2003   LEFT HEART CATH AND CORONARY ANGIOGRAPHY N/A 09/05/2016   Procedure: Left Heart Cath and Coronary Angiography;  Surgeon: Kathleene Hazel, MD;  Location: [facility] INVASIVE CV LAB;  Service: Cardiovascular;  Laterality: N/A;   LEFT HEART CATHETERIZATION WITH CORONARY ANGIOGRAM N/A 12/22/2012   Procedure: LEFT HEART CATHETERIZATION WITH CORONARY ANGIOGRAM;  Surgeon: Thurmon Fair, MD;  Location: [facility] CATH LAB;  Service: Cardiovascular;  Laterality: N/A;   NOSE SURGERY     TOTAL KNEE ARTHROPLASTY Left 03/22/2017   Procedure: LEFT TOTAL KNEE ARTHROPLASTY;  Surgeon: Eugenia Mcalpine, MD;  Location: [facility] ORS;  Service: Orthopedics;  Laterality: Left;   VASECTOMY     Patient Active Problem List   Diagnosis Date Noted   Primary osteoarthritis of right hip 05/16/2022   Osteoarthritis of right knee 03/22/2017   S/P knee replacement 03/22/2017   Chest pain with high risk for cardiac etiology 08/28/2016   Murmur, cardiac 04/01/2015   Angina pectoris syndrome (HCC) 12/18/2012   CAD (coronary  artery disease) 12/18/2012   Hyperlipidemia 12/18/2012   Essential hypertension 12/18/2012   Morbid obesity (HCC) 12/18/2012   Obstructive sleep apnea 12/18/2012    PCP: [name], MD  REFERRING PROVIDER: Rodolph Bong, MD   REFERRING DIAG: Chronic right shoulder pain   THERAPY DIAG:  Stiffness of right shoulder, not elsewhere classified  Acute pain of right shoulder  Rationale for Evaluation and Treatment: Rehabilitation  ONSET DATE: 2 months ago  SUBJECTIVE:  SUBJECTIVE STATEMENT: Patient reports that his right shoulder is doing some better. Raising it high and reaching behind my back is the hardest. To MD 12-26   PERTINENT HISTORY: HTN, OA, history of falling, and chronic hip and knee pain.  PAIN:  Are you having pain? Yes: NPRS scale: 4/10 Pain location: right lateral shoulder Pain description: sharp, stabbing Aggravating factors: reaching overhead and behind his back, right side lying Relieving factors: medication  PRECAUTIONS: Fall  WEIGHT BEARING RESTRICTIONS: No  FALLS:  Has patient fallen in last 6 months? Yes. Number of falls 3; patient reports that most of his falls occur either when he stands up fast as he gets lightheaded or he will miss a step on the ladder. Most recent fall was about 2-3 weeks ago. He suffered no major injuries from these falls.   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 Mild lateral downsloping of the acromion with mild-to-moderate distal lateral subacromial  spurring. 2. 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  COGNITION: Overall cognitive status: Within functional limits for tasks assessed     SENSATION: Patient reports no numbness or tingling  UPPER EXTREMITY ROM:   Active ROM Right eval Left eval  Shoulder flexion 113 136  Shoulder extension    Shoulder abduction 115; strain 141  Shoulder adduction    Shoulder internal rotation To right gluteal; painful To T9  Shoulder external rotation To T1 To T3  Elbow flexion    Elbow extension    Wrist flexion    Wrist extension    Wrist ulnar deviation    Wrist radial deviation    Wrist pronation    Wrist supination    (Blank rows = not tested)  UPPER EXTREMITY MMT:  MMT Right eval Left eval  Shoulder flexion 4/5 4/5  Shoulder extension    Shoulder abduction 4/5; painful 4/5  Shoulder adduction    Shoulder internal rotation 5/5 5/5  Shoulder external rotation 5/5 5/5  Middle trapezius    Lower trapezius    Elbow flexion    Elbow extension    Wrist flexion    Wrist extension    Wrist ulnar deviation    Wrist radial deviation    Wrist pronation    Wrist supination    Grip strength (lbs)    (Blank rows = not tested)  JOINT MOBILITY TESTING:  Right AC joint: WFL and nonpainful   PALPATION:  TTP: right long head of the biceps tendon   TODAY'S TREATMENT:                                                                                                                                         DATE:  12/21  EXERCISE LOG       RT Shoulder  Exercise Repetitions and Resistance Comments  UE Ranger CW/ CCW 3x10   Wall slides 15 reps   UBE  X 6 mins at 90 RPMs   Doorway stretch  2 x 30 seconds   Pulleys 5 minutes   RW 4 2x15 each   Flexion 2# 3x10 Pain free ROM   XTS Blue Shldr EXT 3x10   XTS Blue Rows 3x10   IR HBB GentleTowel stretching    Blank cell = exercise not performed today   PATIENT EDUCATION: Education details:  HEP, plan of care, prognosis, x-ray, and healing Person educated: Patient Education method: Explanation Education comprehension: verbalized understanding  HOME EXERCISE PROGRAM: Today's interventions were added to his HEP. He was educated on performing these interventions twice per day.   ASSESSMENT:  CLINICAL IMPRESSION: Pt arrived today doing fairly well with RT shldr. Rx focused on RT shldr ROM and light strengthening and did well with all act's. Flexion above 130 degrees and IR HBB caused the most discomfort.  .  OBJECTIVE IMPAIRMENTS: decreased activity tolerance, decreased ROM, decreased strength, impaired flexibility, impaired UE functional use, and pain.   ACTIVITY LIMITATIONS: lifting and reach over head  PARTICIPATION LIMITATIONS: community activity and occupation  PERSONAL FACTORS: Past/current experiences, Profession, 3+ comorbidities: HTN, OA, history of falling, and chronic hip and knee pain, and patient reports that he will be out of town for at least the next 2 weeks and will not be able to come to therapy  are also affecting patient's functional outcome.   REHAB POTENTIAL: Fair    CLINICAL DECISION MAKING: Stable/uncomplicated  EVALUATION COMPLEXITY: Low   GOALS: Goals reviewed with patient? Yes  LONG TERM GOALS: Target date: 06/27/22  Patient will be independent with his HEP. Baseline:  Goal status: INITIAL  2.  Patient will demonstrate at least 120 degrees of active right shoulder flexion for improved function reaching overhead. Baseline:  Goal status: INITIAL  3.  Patient will be able to demonstrate at least 120 degrees of active right shoulder abduction for improved function with overhead activity. Baseline:  Goal status: INITIAL  4.  Patient was able to complete his daily activities without his familiar right shoulder pain exceeding 3/10. Baseline:  Goal status: INITIAL  PLAN:  PT FREQUENCY: 2x/week  PT DURATION: 3 weeks  PLANNED  INTERVENTIONS: Therapeutic exercises, Therapeutic activity, Neuromuscular re-education, Patient/Family education, Self Care, Joint mobilization, Dry Needling, Electrical stimulation, Cryotherapy, Moist heat, Vasopneumatic device, Manual therapy, and Re-evaluation  PLAN FOR NEXT SESSION: UBE, pulleys, upper extremity strengthening, manual therapy, modalities as needed   Eldean Klatt,[name], PTA 06/21/2022, 10:31 AM

## 2022-06-22 ENCOUNTER — Ambulatory Visit: Payer: 59 | Admitting: Physical Therapy

## 2022-06-22 ENCOUNTER — Encounter: Payer: Self-pay | Admitting: Physical Therapy

## 2022-06-22 DIAGNOSIS — M25511 Pain in right shoulder: Secondary | ICD-10-CM

## 2022-06-22 DIAGNOSIS — M25611 Stiffness of right shoulder, not elsewhere classified: Secondary | ICD-10-CM

## 2022-06-22 NOTE — Therapy (Signed)
OUTPATIENT PHYSICAL THERAPY SHOULDER TREATMENT   Patient Name: Craig Copeland MRN: [id number] DOB:24-Sep-1955, 66 y.o., male Today's Date: 06/22/2022  END OF SESSION:  PT End of Session - 06/22/22 0936     Visit Number 3    Number of Visits 6    Date for PT Re-Evaluation 07/27/22    PT Start Time 0815    PT Stop Time 0906    PT Time Calculation (min) 51 min    Activity Tolerance Patient tolerated treatment well    Behavior During Therapy [facility] for tasks assessed/performed             Past Medical History:  Diagnosis Date   CAD (coronary artery disease)    Dizziness    Dyslipidemia    Flu 06/2016   Had again 01/208 with PNA   Obesity    OSA (obstructive sleep apnea)    Systemic hypertension    Vasospastic angina [facility])    Past Surgical History:  Procedure Laterality Date   CARDIAC CATHETERIZATION  12/02/2002   <20% restenosis RCA   CARDIAC CATHETERIZATION  01/30/2010   20-30% in-stent restenosis,60-70% ostial stenosis   CORONARY ANGIOPLASTY WITH STENT PLACEMENT  11/13/2002   mid RCA   KNEE SURGERY  2003   LEFT HEART CATH AND CORONARY ANGIOGRAPHY N/A 09/05/2016   Procedure: Left Heart Cath and Coronary Angiography;  Surgeon: Kathleene Hazel, MD;  Location: [facility] INVASIVE CV LAB;  Service: Cardiovascular;  Laterality: N/A;   LEFT HEART CATHETERIZATION WITH CORONARY ANGIOGRAM N/A 12/22/2012   Procedure: LEFT HEART CATHETERIZATION WITH CORONARY ANGIOGRAM;  Surgeon: Thurmon Fair, MD;  Location: [facility] CATH LAB;  Service: Cardiovascular;  Laterality: N/A;   NOSE SURGERY     TOTAL KNEE ARTHROPLASTY Left 03/22/2017   Procedure: LEFT TOTAL KNEE ARTHROPLASTY;  Surgeon: Eugenia Mcalpine, MD;  Location: [facility] ORS;  Service: Orthopedics;  Laterality: Left;   VASECTOMY     Patient Active Problem List   Diagnosis Date Noted   Primary osteoarthritis of right hip 05/16/2022   Osteoarthritis of right knee 03/22/2017   S/P knee replacement 03/22/2017   Chest pain with high risk for  cardiac etiology 08/28/2016   Murmur, cardiac 04/01/2015   Angina pectoris syndrome (HCC) 12/18/2012   CAD (coronary artery disease) 12/18/2012   Hyperlipidemia 12/18/2012   Essential hypertension 12/18/2012   Morbid obesity (HCC) 12/18/2012   Obstructive sleep apnea 12/18/2012    PCP: [name], MD  REFERRING PROVIDER: Rodolph Bong, MD   REFERRING DIAG: Chronic right shoulder pain   THERAPY DIAG:  Stiffness of right shoulder, not elsewhere classified  Acute pain of right shoulder  Rationale for Evaluation and Treatment: Rehabilitation  ONSET DATE: 2 months ago  SUBJECTIVE:  SUBJECTIVE STATEMENT: Shoulder hurts when I move it a certain why, especially backward.  PERTINENT HISTORY: HTN, OA, history of falling, and chronic hip and knee pain.  PAIN:  Are you having pain? Yes: NPRS scale: 4/10 Pain location: right lateral shoulder Pain description: sharp, stabbing Aggravating factors: reaching overhead and behind his back, right side lying Relieving factors: medication  PRECAUTIONS: Fall  WEIGHT BEARING RESTRICTIONS: No  FALLS:  Has patient fallen in last 6 months? Yes. Number of falls 3; patient reports that most of his falls occur either when he stands up fast as he gets lightheaded or he will miss a step on the ladder. Most recent fall was about 2-3 weeks ago. He suffered no major injuries from these falls.   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 Mild lateral downsloping of the acromion with  mild-to-moderate distal lateral subacromial spurring. 2. 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  COGNITION: Overall cognitive status: Within functional limits for tasks assessed     SENSATION: Patient reports no numbness or tingling  UPPER EXTREMITY ROM:   Active ROM Right eval Left eval  Shoulder flexion 113 136  Shoulder extension    Shoulder abduction 115; strain 141  Shoulder adduction    Shoulder internal rotation To right gluteal; painful To T9  Shoulder external rotation To T1 To T3  Elbow flexion    Elbow extension    Wrist flexion    Wrist extension    Wrist ulnar deviation    Wrist radial deviation    Wrist pronation    Wrist supination    (Blank rows = not tested)  UPPER EXTREMITY MMT:  MMT Right eval Left eval  Shoulder flexion 4/5 4/5  Shoulder extension    Shoulder abduction 4/5; painful 4/5  Shoulder adduction    Shoulder internal rotation 5/5 5/5  Shoulder external rotation 5/5 5/5  Middle trapezius    Lower trapezius    Elbow flexion    Elbow extension    Wrist flexion    Wrist extension    Wrist ulnar deviation    Wrist radial deviation    Wrist pronation    Wrist supination    Grip strength (lbs)    (Blank rows = not tested)  JOINT MOBILITY TESTING:  Right AC joint: WFL and nonpainful   PALPATION:  TTP: right long head of the biceps tendon   TODAY'S TREATMENT:                                                                                                                                         DATE:  06/22/22  EXERCISE LOG       RT Shoulder  Exercise Repetitions and Resistance Comments          UBE  X 8 mins at 90 RPMs            RW 4 Red T-band to fatigue.   Combo e'stim/US at 1.50 W/CM2 x 8 minutes to patient's right anterior and lateral shoulder region f/b IASTM x 5 minutes f/b IFC at 80-150 Hz on 40% scan x 20 minutes.  Patient tolerated treatment without complaint with  normal modality response following removal of modality.     PATIENT EDUCATION: Education details: HEP, plan of care, prognosis, x-ray, and healing Person educated: Patient Education method: Explanation Education comprehension: verbalized understanding  HOME EXERCISE PROGRAM: Today's interventions were added to his HEP. He was educated on performing these interventions twice per day.   ASSESSMENT:  CLINICAL IMPRESSION: Excellent technique with RW4 exercise.  Tender over right bicipital groove and middle deltoid.  Good response to treatment today.  OBJECTIVE IMPAIRMENTS: decreased activity tolerance, decreased ROM, decreased strength, impaired flexibility, impaired UE functional use, and pain.   ACTIVITY LIMITATIONS: lifting and reach over head  PARTICIPATION LIMITATIONS: community activity and occupation  PERSONAL FACTORS: Past/current experiences, Profession, 3+ comorbidities: HTN, OA, history of falling, and chronic hip and knee pain, and patient reports that he will be out of town for at least the next 2 weeks and will not be able to come to therapy  are also affecting patient's functional outcome.   REHAB POTENTIAL: Fair    CLINICAL DECISION MAKING: Stable/uncomplicated  EVALUATION COMPLEXITY: Low   GOALS: Goals reviewed with patient? Yes  LONG TERM GOALS: Target date: 06/27/22  Patient will be independent with his HEP. Baseline:  Goal status: INITIAL  2.  Patient will demonstrate at least 120 degrees of active right shoulder flexion for improved function reaching overhead. Baseline:  Goal status: INITIAL  3.  Patient will be able to demonstrate at least 120 degrees of active right shoulder abduction for improved function with overhead activity. Baseline:  Goal status: INITIAL  4.  Patient was able to complete his daily activities without his familiar right shoulder pain exceeding 3/10. Baseline:  Goal status: INITIAL  PLAN:  PT FREQUENCY: 2x/week  PT  DURATION: 3 weeks  PLANNED INTERVENTIONS: Therapeutic exercises, Therapeutic activity, Neuromuscular re-education, Patient/Family education, Self Care, Joint mobilization, Dry Needling, Electrical stimulation, Cryotherapy, Moist heat, Vasopneumatic device, Manual therapy, and Re-evaluation  PLAN FOR NEXT SESSION: UBE, pulleys, upper extremity strengthening, manual therapy, modalities as needed   Auriana Scalia, [place], PT 06/22/2022, 9:41 AM

## 2022-06-26 ENCOUNTER — Encounter: Payer: Self-pay | Admitting: *Deleted

## 2022-06-26 ENCOUNTER — Ambulatory Visit (INDEPENDENT_AMBULATORY_CARE_PROVIDER_SITE_OTHER): Payer: 59 | Admitting: Family Medicine

## 2022-06-26 ENCOUNTER — Ambulatory Visit: Payer: 59 | Admitting: *Deleted

## 2022-06-26 VITALS — BP 140/74 | HR 74 | Ht 73.0 in | Wt 316.0 lb

## 2022-06-26 DIAGNOSIS — M25551 Pain in right hip: Secondary | ICD-10-CM | POA: Diagnosis not present

## 2022-06-26 DIAGNOSIS — M25511 Pain in right shoulder: Secondary | ICD-10-CM | POA: Diagnosis not present

## 2022-06-26 DIAGNOSIS — M25611 Stiffness of right shoulder, not elsewhere classified: Secondary | ICD-10-CM

## 2022-06-26 DIAGNOSIS — G8929 Other chronic pain: Secondary | ICD-10-CM

## 2022-06-26 NOTE — Patient Instructions (Addendum)
Thank you for coming in today.   Recheck after Feb 15th We can look at the shoulder again and inject the hip if needed.   Continue PT.

## 2022-06-26 NOTE — Therapy (Addendum)
OUTPATIENT PHYSICAL THERAPY SHOULDER TREATMENT   Patient Name: Craig Copeland MRN: [id number] DOB:27-Sep-1955, 66 y.o., male Today's Date: 06/26/2022  END OF SESSION:  PT End of Session - 06/26/22 1037     Visit Number 4    Number of Visits 6    Date for PT Re-Evaluation 07/27/22    PT Start Time 1030    PT Stop Time 1105    PT Time Calculation (min) 35 min             Past Medical History:  Diagnosis Date   CAD (coronary artery disease)    Dizziness    Dyslipidemia    Flu 06/2016   Had again 01/208 with PNA   Obesity    OSA (obstructive sleep apnea)    Systemic hypertension    Vasospastic angina [facility])    Past Surgical History:  Procedure Laterality Date   CARDIAC CATHETERIZATION  12/02/2002   <20% restenosis RCA   CARDIAC CATHETERIZATION  01/30/2010   20-30% in-stent restenosis,60-70% ostial stenosis   CORONARY ANGIOPLASTY WITH STENT PLACEMENT  11/13/2002   mid RCA   KNEE SURGERY  2003   LEFT HEART CATH AND CORONARY ANGIOGRAPHY N/A 09/05/2016   Procedure: Left Heart Cath and Coronary Angiography;  Surgeon: Kathleene Hazel, MD;  Location: [facility] INVASIVE CV LAB;  Service: Cardiovascular;  Laterality: N/A;   LEFT HEART CATHETERIZATION WITH CORONARY ANGIOGRAM N/A 12/22/2012   Procedure: LEFT HEART CATHETERIZATION WITH CORONARY ANGIOGRAM;  Surgeon: Thurmon Fair, MD;  Location: [facility] CATH LAB;  Service: Cardiovascular;  Laterality: N/A;   NOSE SURGERY     TOTAL KNEE ARTHROPLASTY Left 03/22/2017   Procedure: LEFT TOTAL KNEE ARTHROPLASTY;  Surgeon: Eugenia Mcalpine, MD;  Location: [facility] ORS;  Service: Orthopedics;  Laterality: Left;   VASECTOMY     Patient Active Problem List   Diagnosis Date Noted   Primary osteoarthritis of right hip 05/16/2022   Osteoarthritis of right knee 03/22/2017   S/P knee replacement 03/22/2017   Chest pain with high risk for cardiac etiology 08/28/2016   Murmur, cardiac 04/01/2015   Angina pectoris syndrome (HCC) 12/18/2012   CAD (coronary  artery disease) 12/18/2012   Hyperlipidemia 12/18/2012   Essential hypertension 12/18/2012   Morbid obesity (HCC) 12/18/2012   Obstructive sleep apnea 12/18/2012    PCP: [name], MD  REFERRING PROVIDER: Rodolph Bong, MD   REFERRING DIAG: Chronic right shoulder pain   THERAPY DIAG:  Stiffness of right shoulder, not elsewhere classified  Acute pain of right shoulder  Rationale for Evaluation and Treatment: Rehabilitation  ONSET DATE: 2 months ago  SUBJECTIVE:  SUBJECTIVE STATEMENT: Need to leave early today. Shoulder hurts when I move it a certain why, especially backward and up. Sore/tight after last Rx. No estim today  PERTINENT HISTORY: HTN, OA, history of falling, and chronic hip and knee pain.  PAIN:  Are you having pain? Yes: NPRS scale: 4/10 Pain location: right lateral shoulder Pain description: sharp, stabbing Aggravating factors: reaching overhead and behind his back, right side lying Relieving factors: medication  PRECAUTIONS: Fall  WEIGHT BEARING RESTRICTIONS: No  FALLS:  Has patient fallen in last 6 months? Yes. Number of falls 3; patient reports that most of his falls occur either when he stands up fast as he gets lightheaded or he will miss a step on the ladder. Most recent fall was about 2-3 weeks ago. He suffered no major injuries from these falls.   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 Mild lateral downsloping of the acromion with mild-to-moderate distal lateral  subacromial spurring. 2. 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  COGNITION: Overall cognitive status: Within functional limits for tasks assessed     SENSATION: Patient reports no numbness or tingling  UPPER EXTREMITY ROM:   Active ROM Right eval Left eval  Shoulder flexion 113 136  Shoulder extension    Shoulder abduction 115; strain 141  Shoulder adduction    Shoulder internal rotation To right gluteal; painful To T9  Shoulder external rotation To T1 To T3  Elbow flexion    Elbow extension    Wrist flexion    Wrist extension    Wrist ulnar deviation    Wrist radial deviation    Wrist pronation    Wrist supination    (Blank rows = not tested)  UPPER EXTREMITY MMT:  MMT Right eval Left eval  Shoulder flexion 4/5 4/5  Shoulder extension    Shoulder abduction 4/5; painful 4/5  Shoulder adduction    Shoulder internal rotation 5/5 5/5  Shoulder external rotation 5/5 5/5  Middle trapezius    Lower trapezius    Elbow flexion    Elbow extension    Wrist flexion    Wrist extension    Wrist ulnar deviation    Wrist radial deviation    Wrist pronation    Wrist supination    Grip strength (lbs)    (Blank rows = not tested)  JOINT MOBILITY TESTING:  Right AC joint: WFL and nonpainful   PALPATION:  TTP: right long head of the biceps tendon   TODAY'S TREATMENT:  DATE:                                    06/26/22  EXERCISE LOG       RT Shoulder  Exercise Repetitions and Resistance Comments          UBE  X 8 mins at 90 RPMs   Flexion  2# 3x10       RW 4 Red T-band to fatigue, green T-band x fatigue    Combo e'stim/US at 1.50 W/CM2 x 8 minutes to patient's right anterior and lateral shoulder region      PATIENT EDUCATION: Education details: HEP, plan of care, prognosis, x-ray, and healing Person educated:  Patient Education method: Explanation Education comprehension: verbalized understanding  HOME EXERCISE PROGRAM: Today's interventions were added to his HEP. He was educated on performing these interventions twice per day.   ASSESSMENT:  CLINICAL IMPRESSION: Pt arrived today reporting soreness after last Rx, but unsure why. He did well with [place]x that was performed and with no complaints after [place] combo. Shortened Rx due to Pt needing to leave early.    Good response to treatment today.  PHYSICAL THERAPY DISCHARGE SUMMARY  Visits from Start of Care: 4  Current functional level related to goals / functional outcomes: Patient was unable to meet his goals for skilled physical therapy.    Remaining deficits: Pain and AROM    Education / Equipment: HEP   Patient agrees to discharge. Patient goals were not met. Patient is being discharged due to not returning since the last visit.  Candi Leash, PT, DPT    OBJECTIVE IMPAIRMENTS: decreased activity tolerance, decreased ROM, decreased strength, impaired flexibility, impaired UE functional use, and pain.   ACTIVITY LIMITATIONS: lifting and reach over head  PARTICIPATION LIMITATIONS: community activity and occupation  PERSONAL FACTORS: Past/current experiences, Profession, 3+ comorbidities: HTN, OA, history of falling, and chronic hip and knee pain, and patient reports that he will be out of town for at least the next 2 weeks and will not be able to come to therapy  are also affecting patient's functional outcome.   REHAB POTENTIAL: Fair    CLINICAL DECISION MAKING: Stable/uncomplicated  EVALUATION COMPLEXITY: Low   GOALS: Goals reviewed with patient? Yes  LONG TERM GOALS: Target date: 06/27/22  Patient will be independent with his HEP. Baseline:  Goal status: INITIAL  2.  Patient will demonstrate at least 120 degrees of active right shoulder flexion for improved function reaching overhead. Baseline:  Goal status:  INITIAL  3.  Patient will be able to demonstrate at least 120 degrees of active right shoulder abduction for improved function with overhead activity. Baseline:  Goal status: INITIAL  4.  Patient was able to complete his daily activities without his familiar right shoulder pain exceeding 3/10. Baseline:  Goal status: INITIAL  PLAN:  PT FREQUENCY: 2x/week  PT DURATION: 3 weeks  PLANNED INTERVENTIONS: Therapeutic exercises, Therapeutic activity, Neuromuscular re-education, Patient/Family education, Self Care, Joint mobilization, Dry Needling, Electrical stimulation, Cryotherapy, Moist heat, Vasopneumatic device, Manual therapy, and Re-evaluation  PLAN FOR NEXT SESSION: UBE, pulleys, upper extremity strengthening, manual therapy, modalities as needed   Koleen Celia,[name], PTA 06/26/2022, 2:31 PM

## 2022-08-08 ENCOUNTER — Ambulatory Visit: Payer: 59 | Attending: Cardiovascular Disease | Admitting: Cardiovascular Disease

## 2022-08-08 ENCOUNTER — Encounter: Payer: Self-pay | Admitting: Cardiovascular Disease

## 2022-08-08 VITALS — BP 112/76 | HR 68 | Ht 73.0 in | Wt 308.6 lb

## 2022-08-08 DIAGNOSIS — I10 Essential (primary) hypertension: Secondary | ICD-10-CM | POA: Diagnosis not present

## 2022-08-08 DIAGNOSIS — E7800 Pure hypercholesterolemia, unspecified: Secondary | ICD-10-CM

## 2022-08-08 DIAGNOSIS — I25118 Atherosclerotic heart disease of native coronary artery with other forms of angina pectoris: Secondary | ICD-10-CM | POA: Diagnosis not present

## 2022-08-08 DIAGNOSIS — I358 Other nonrheumatic aortic valve disorders: Secondary | ICD-10-CM

## 2022-08-08 DIAGNOSIS — G4733 Obstructive sleep apnea (adult) (pediatric): Secondary | ICD-10-CM

## 2022-08-08 NOTE — Patient Instructions (Signed)
Medication Instructions:  Your physician recommends that you continue on your current medications as directed. Please refer to the Current Medication list given to you today.  *If you need a refill on your cardiac medications before your next appointment, please call your pharmacy*   Lab Work: NONE If you have labs (blood work) drawn today and your tests are completely normal, you will receive your results only by: MyChart Message (if you have MyChart) OR A paper copy in the mail If you have any lab test that is abnormal or we need to change your treatment, we will call you to review the results.   Testing/Procedures: NONE   Follow-Up: At [facility], you and your health needs are our priority.  As part of our continuing mission to provide you with exceptional heart care, we have created designated Provider Care Teams.  These Care Teams include your primary Cardiologist (physician) and Advanced Practice Providers (APPs -  Physician Assistants and Nurse Practitioners) who all work together to provide you with the care you need, when you need it.  We recommend signing up for the patient portal called "MyChart".  Sign up information is provided on this After Visit Summary.  MyChart is used to connect with patients for Virtual Visits (Telemedicine).  Patients are able to view lab/test results, encounter notes, upcoming appointments, etc.  Non-urgent messages can be sent to your provider as well.   To learn more about what you can do with MyChart, go to [email or web address].    Your next appointment:   1 year(s)  Provider:   Mihai Croitoru, MD    

## 2022-08-08 NOTE — Progress Notes (Signed)
Patient ID: Craig Copeland, male   DOB: 10-18-1955, 67 y.o.   MRN: [id number]      Cardiology Office Note   Date:  08/08/2022   ID:  Jia, Mohamed 11-16-1955, MRN [id number]  PCP:  Deland Pretty, MD  Cardiologist:   Sanda Klein, MD   Chief Complaint  Patient presents with   Coronary Artery Disease          History of Present Illness: Craig Copeland is a 67 y.o. male who presents for follow-up for coronary artery disease, hypertension and hyperlipidemia.  He is doing better.  He has not had any chest discomfort requiring the use of nitroglycerin in the last year.  He has noticed improvement after increasing the dose of isosorbide mononitrate to twice daily.  He has also noticed that if he misses a dose of his medication he developed some mild chest tightness and more fatigue.  Does not have any chest discomfort with physical activity.  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  Does not have problems with dyspnea at rest and with activity.  Had some lower extremity edema which improved after Dr. Shelia Media added hydrochlorothiazide to his medication list.  She does have occasional orthostatic dizziness, but has not had palpitations near syncope or syncope.  He is compliant with CPAP and denies daytime hypersomnolence.  He is switching to a vegan diet together with his wife.  He is only lost about 6 pounds.  He did not tolerate treatment with GLP-1 agonist due to the GI side effects.  BMI remains just over 40.  Had labs including a lipid profile in the last couple of months with PCP, but I do not have those results.  He last underwent cardiac catheterization on September 05, 2016. The previously placed stents in his right coronary artery were widely patent and the only significant stenosis was an ostial 70% lesion in a relatively small caliber ramus intermedius artery. No revascularization was performed.  He seems to be tolerating pravastatin without problems. He was not  tolerant of either rosuvastatin or atorvastatin.            He has a history of previous stent to the right coronary artery(2004, drug-eluting 3.0 x 33 mm Cypher stent overlapping 3.0 x 23 mm Cypher stent in the mid to proximal right coronary artery) and a moderate to severe ostial lesion of a small to medium ramus intermedius artery (too small for percutaneous revascularization). No change on cardiac cath performed in 2018. 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            His last cardiac catheterization was in March 2018, that showed findings identical to 2014, 2011 and 2004. He has normal left ventricle systolic function by echo most recently performed in October 2016; EF 50-55% by LV angiography in March 2018.  History of hypertension and hyperlipidemia. He is morbidly obese. He is compliant with CPAP for obstructive sleep apnea. He is on chronic treatment with aspirin and clopidogrel.    Past Medical History:  Diagnosis Date   CAD (coronary artery disease)    Dizziness    Dyslipidemia    Flu 06/2016   Had again 01/208 with PNA   Obesity    OSA (obstructive sleep apnea)    Systemic hypertension    Vasospastic angina [facility])     Past Surgical History:  Procedure Laterality Date   CARDIAC CATHETERIZATION  12/02/2002   <  20% restenosis RCA   CARDIAC CATHETERIZATION  01/30/2010   20-30% in-stent restenosis,60-70% ostial stenosis   CORONARY ANGIOPLASTY WITH STENT PLACEMENT  11/13/2002   mid RCA   KNEE SURGERY  2003   LEFT HEART CATH AND CORONARY ANGIOGRAPHY N/A 09/05/2016   Procedure: Left Heart Cath and Coronary Angiography;  Surgeon: Burnell Blanks, MD;  Location: [place] CV LAB;  Service: Cardiovascular;  Laterality: N/A;   LEFT HEART CATHETERIZATION WITH CORONARY ANGIOGRAM N/A 12/22/2012   Procedure: LEFT HEART CATHETERIZATION WITH CORONARY ANGIOGRAM;  Surgeon: Sanda Klein, MD;  Location: [place] CATH  LAB;  Service: Cardiovascular;  Laterality: N/A;   NOSE SURGERY     TOTAL KNEE ARTHROPLASTY Left 03/22/2017   Procedure: LEFT TOTAL KNEE ARTHROPLASTY;  Surgeon: Sydnee Cabal, MD;  Location: [facility] ORS;  Service: Orthopedics;  Laterality: Left;   VASECTOMY       Current Outpatient Medications  Medication Sig Dispense Refill   amLODipine (NORVASC) 5 MG tablet TAKE 1 TABLET BY MOUTH EVERY EVENING 90 tablet 3   aspirin EC 81 MG tablet Take 81 mg by mouth daily.     cholecalciferol (VITAMIN D) 1000 units tablet Take 1,000 Units by mouth daily.     clopidogrel (PLAVIX) 75 MG tablet Take 75 mg by mouth daily.     ezetimibe (ZETIA) 10 MG tablet Take 10 mg by mouth every evening.      fish oil-omega-3 fatty acids 1000 MG capsule Take 1 g by mouth daily.      isosorbide mononitrate (IMDUR) 30 MG 24 hr tablet Take 2 tablets (60 mg total) by mouth daily. Pt needs office visit before future refills second attempt (Patient taking differently: Take 30 mg by mouth 2 (two) times daily.) 30 tablet 0   losartan (COZAAR) 100 MG tablet Take 0.5 tablets (50 mg total) by mouth daily. 90 tablet 3   nitroGLYCERIN (NITROSTAT) 0.4 MG SL tablet PLACE 1 TABLET UNDER THE TONGUE EVERY 5 MINUTES AS NEEDED FOR CHEST PAIN. 25 tablet 2   pravastatin (PRAVACHOL) 80 MG tablet Take 80 mg by mouth at bedtime.      hydrochlorothiazide (MICROZIDE) 12.5 MG capsule Take 12.5 mg by mouth every morning.     No current facility-administered medications for this visit.    Allergies:   Patient has no known allergies.    Social History:  The patient  reports that he has never smoked. He has never used smokeless tobacco. He reports current alcohol use of about 1.0 standard drink of alcohol per week. He reports that he does not use drugs.   Family History:  The patient's family history includes Alzheimer's disease in his paternal grandfather; Cancer in his paternal grandfather; Heart failure in his father and maternal grandmother;  Hypertension in his father; Stroke in his paternal grandmother.    ROS:  Please see the history of present illness.  He denies shortness of breath at rest or with activity, orthopnea, PND, claudication, focal neurological complaints.  All other systems are reviewed and are negative.  PHYSICAL EXAM: VS:  BP 112/76 (BP Location: Left Arm, Patient Position: Sitting, Cuff Size: Large)   Pulse 68   Ht 6\' 1"  (1.854 m)   Wt (!) 308 lb 9.6 oz (140 kg)   BMI 40.71 kg/m  , BMI Body mass index is 40.71 kg/m.   General: Alert, oriented x3, no distress, morbidly obese Head: no evidence of trauma, PERRL, EOMI, no exophtalmos or lid lag, no myxedema, no xanthelasma; normal ears, nose and  oropharynx Neck: normal jugular venous pulsations and no hepatojugular reflux; brisk carotid pulses without delay and no carotid bruits Chest: clear to auscultation, no signs of consolidation by percussion or palpation, normal fremitus, symmetrical and full respiratory excursions Cardiovascular: normal position and quality of the apical impulse, regular rhythm, normal first and second heart sounds, no murmurs, rubs or gallops Abdomen: no tenderness or distention, no masses by palpation, no abnormal pulsatility or arterial bruits, normal bowel sounds, no hepatosplenomegaly Extremities: no clubbing, cyanosis or edema; 2+ radial, ulnar and brachial pulses bilaterally; 2+ right femoral, posterior tibial and dorsalis pedis pulses; 2+ left femoral, posterior tibial and dorsalis pedis pulses; no subclavian or femoral bruits Neurological: grossly nonfocal Psych: Normal mood and affect    EKG: Ordered today and personally reviewed shows sinus rhythm with first-degree AV block (PR 200 ms), old Q waves in the inferior leads, no acute ischemic abnormalities, QTc 414 ms Recent Labs: 10/24/2020 Creatinine 0.94, ALT 54, glucose 114 Lipid Panel     Component Value Date/Time   CHOL 137 04/08/2015 0804   TRIG 157 (H) 04/08/2015  0804   HDL 32 (L) 04/08/2015 0804   CHOLHDL 4.3 04/08/2015 0804   VLDL 31 (H) 04/08/2015 0804   LDLCALC 74 04/08/2015 0804    February 20, 2018 Total cholesterol 117, HDL 32, triglycerides 108, calculated LDL 67.  9 08 2020 Total cholesterol 119, HDL 34, triglycerides 72 Creatinine 0.7, normal liver function test  04/26/2021 Cholesterol 122, HDL 33, LDL 70, triglycerides 93  Wt Readings from Last 3 Encounters:  08/08/22 (!) 308 lb 9.6 oz (140 kg)  06/26/22 (!) 316 lb (143.3 kg)  05/16/22 (!) 310 lb (140.6 kg)     ASSESSMENT AND PLAN:  1. Coronary artery disease of native artery of native heart with stable angina pectoris (HCC)   2. Hypercholesterolemia   3. Essential hypertension   4. Morbid obesity (HCC)   5. OSA on CPAP   6. Aortic valve sclerosis        1. CAD s/p PCI RCA: Good symptomatic response to twice daily long-acting nitrates added to his amlodipine.  This seems to confirm the suspicion that he has some degree of coronary vasospasm.  He is not requiring any sublingual nitroglycerin anymore.  He is able to be physically active. He did not tolerate higher beta-blocker doses be due to bradycardia.  Continue lipid-lowering therapy dual antiplatelet therapy. 2. Hyperlipidemia.  I do not have his most recent lipid profile, but will request that from Dr. Renne Crigler. On pravastatin and ezetimibe his lipid profile was acceptable.  He did not tolerate more potent statins.  Low HDL is unlikely to improve without substantial weight loss. 3. Hypertension very well-controlled.  Has mild orthostatic hypotension. 4. Morbid obesity: He is now eating a vegan diet and has lost just a little bit of weight.  Cautioned him that cutting back on meat may inadvertently have the disadvantage of an increased intake of starchy foods, which she has always preferred.  This may prevent weight loss and actually have deleterious metabolic effects.  Focus on increased intake of nonstarchy vegetables,  preferably high protein once like legumes, nuts, oils, avocado, etc. 5. OSA: He reports a percent compliance with CPAP.  He does not have daytime hypersomnolence. 6. Aortic sclerosis/AS:   Murmur is unchanged and not impressive.  Echo in 2016 did not show any increased gradients across the aortic valve.  Reviewed the symptoms of aortic stenosis (exertional dyspnea, worsening exertional angina, exertional syncope).  If  these occur he should call us and we will repeat his echo.  Current medicines are reviewed at length with the patient today.   Labs/ tests ordered today include:   No orders of the defined types were placed in this encounter.    Patient Instructions  Medication Instructions:  Your physician recommends that you continue on your current medications as directed. Please refer to the Current Medication list given to you today.  *If you need a refill on your cardiac medications before your next appointment, please call your pharmacy*   Lab Work: NONE If you have labs (blood work) drawn today and your tests are completely normal, you will receive your results only by: [place] (if you have MyChart) OR A paper copy in the mail If you have any lab test that is abnormal or we need to change your treatment, we will call you to review the results.   Testing/Procedures: NONE   Follow-Up: At [facility], you and your health needs are our priority.  As part of our continuing mission to provide you with exceptional heart care, we have created designated Provider Care Teams.  These Care Teams include your primary Cardiologist (physician) and Advanced Practice Providers (APPs -  Physician Assistants and Nurse Practitioners) who all work together to provide you with the care you need, when you need it.  We recommend signing up for the patient portal called "MyChart".  Sign up information is provided on this After Visit Summary.  MyChart is used to connect with patients  for Virtual Visits (Telemedicine).  Patients are able to view lab/test results, encounter notes, upcoming appointments, etc.  Non-urgent messages can be sent to your provider as well.   To learn more about what you can do with MyChart, go to [email or web address].    Your next appointment:   1 year(s)  Provider:   Sanda Klein, MD      Signed, Sanda Klein, MD  08/08/2022 8:30 AM    Sanda Klein, MD, [facility] [phone] office [phone] pager

## 2022-08-09 NOTE — Addendum Note (Signed)
Addended by: Orma Render on: 08/09/2022 04:41 PM   Modules accepted: Orders

## 2022-08-15 NOTE — Progress Notes (Signed)
I, Peterson Lombard, LAT, ATC acting as a scribe for Lynne Leader, MD.  Craig Copeland is a 67 y.o. male who presents to [place] at [facility] today for cont'd R shoulder and R hip pain. Pt was last seen by Dr. Georgina Snell on 06/26/22 and was feeling much better after a course of PT and prior R femoral acetabular steroid injection on 05/16/22. Today, pt reports R hip pain has returned over the last 3 wks. Pt locates pain to the anterior-lateral aspect of the R hip.   Pt also c/o cont'd R shoulder pain. He notes that the e-stim PT did was irritating. Pt is also having pain in the proximal R forearm, along the lateral aspect.   Dx testing: 05/16/2022 R shoulder & R hip XR  08/28/17 L LE Vasc US              Pertinent review of systems: No fevers or chills  Relevant historical information: Coronary artery disease   Exam:  BP 122/74   Pulse 63   Ht 6' 1"$  (1.854 m)   Wt (!) 306 lb (138.8 kg)   SpO2 96%   BMI 40.37 kg/m  General: Well Developed, well nourished, and in no acute distress.   MSK: Right hip: Decreased range of motion.  Right shoulder: Decreased range of motion pain with abduction.  Mechanical clicking and popping present with range of motion.  Strength is intact.  Right elbow: Normal appearing Tender palpation at anterior lateral elbow.  Normal elbow motion. Intact strength. Pain present with resisted supination.  Pain is not located overlying the distal biceps tendon with resisted supination.    Lab and Radiology Results  Procedure: Real-time Ultrasound Guided Injection of right hip interarticular femoral acetabular joint Device: Philips Affiniti 50G Images permanently stored and available for review in PACS Verbal informed consent obtained.  Discussed risks and benefits of procedure. Warned about infection, bleeding, hyperglycemia damage to structures among others. Patient expresses understanding and agreement Time-out conducted.   Noted no  overlying erythema, induration, or other signs of local infection.   Skin prepped in a sterile fashion.   Local anesthesia: Topical Ethyl chloride.   With sterile technique and under real time ultrasound guidance: 40 mg of Kenalog and 2 mL Marcaine injected into hip joint. Fluid seen entering the joint capsule.   Completed without difficulty   Pain immediately resolved suggesting accurate placement of the medication.   Advised to call if fevers/chills, erythema, induration, drainage, or persistent bleeding.   Images permanently stored and available for review in the ultrasound unit.  Impression: Technically successful ultrasound guided injection.    X-ray images right elbow obtained today personally and independently interpreted Minimal joint effusion.  Mild degenerative changes.  No acute fractures. Await formal radiology review     Assessment and Plan: 67 y.o. 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  Plan for repeat steroid injection.  We can repeat this injection every 3 months.  Ultimately he is going to need a hip replacement at some point.  Chronic right shoulder pain with mechanical symptoms.  This has not improved with physical therapy over the last 3 months.  Plan for MRI arthrogram to further characterize source of pain and for potential surgical or injection planning.  Recheck after MRI arthrogram.  New elbow pain.  This occurred after physical therapy.  Physical therapy did not work near the elbow so the underlying etiology is little unclear.  He seems to be  hurting along the supinator area of the elbow.  Plan for x-ray and a little bit of watchful waiting.  He does have some degenerative changes in the mild joint effusion which could be the main source of pain.   PDMP not reviewed this encounter. Orders Placed This Encounter  Procedures   [place] LIMITED JOINT SPACE STRUCTURES LOW RIGHT(NO LINKED CHARGES)    Order Specific Question:   Reason for Exam (SYMPTOM  OR DIAGNOSIS  REQUIRED)    Answer:   right hip pain    Order Specific Question:   Preferred imaging location?    Answer:   [place]   DG ELBOW COMPLETE RIGHT (3+VIEW)    Standing Status:   Future    Number of Occurrences:   1    Standing Expiration Date:   09/15/2022    Order Specific Question:   Reason for Exam (SYMPTOM  OR DIAGNOSIS REQUIRED)    Answer:   right elbow pain    Order Specific Question:   Preferred imaging location?    Answer:   [facility]   MR Shoulder Right W Contrast    Standing Status:   Future    Standing Expiration Date:   08/18/2023    Scheduling Instructions:     Schedule w/ Dr. Darene Lamer 1 hour prior for injection    Order Specific Question:   Reason for Exam (SYMPTOM  OR DIAGNOSIS REQUIRED)    Answer:   right shoulder pain    Order Specific Question:   If indicated for the ordered procedure, I authorize the administration of contrast media per Radiology protocol    Answer:   Yes    Order Specific Question:   What is the patient's sedation requirement?    Answer:   No Sedation    Order Specific Question:   Does the patient have a pacemaker or implanted devices?    Answer:   No    Order Specific Question:   Preferred imaging location?    Answer:   [facility] (table limit-350lbs)   No orders of the defined types were placed in this encounter.    Discussed warning signs or symptoms. Please see discharge instructions. Patient expresses understanding.   The above documentation has been reviewed and is accurate and complete Lynne Leader, M.D.

## 2022-08-17 ENCOUNTER — Ambulatory Visit: Payer: Self-pay

## 2022-08-17 ENCOUNTER — Ambulatory Visit (INDEPENDENT_AMBULATORY_CARE_PROVIDER_SITE_OTHER): Payer: 59 | Admitting: Family Medicine

## 2022-08-17 ENCOUNTER — Ambulatory Visit (INDEPENDENT_AMBULATORY_CARE_PROVIDER_SITE_OTHER): Payer: 59

## 2022-08-17 VITALS — BP 122/74 | HR 63 | Ht 73.0 in | Wt 306.0 lb

## 2022-08-17 DIAGNOSIS — G8929 Other chronic pain: Secondary | ICD-10-CM | POA: Diagnosis not present

## 2022-08-17 DIAGNOSIS — M25551 Pain in right hip: Secondary | ICD-10-CM | POA: Diagnosis not present

## 2022-08-17 DIAGNOSIS — M25521 Pain in right elbow: Secondary | ICD-10-CM

## 2022-08-17 DIAGNOSIS — M25511 Pain in right shoulder: Secondary | ICD-10-CM

## 2022-08-17 NOTE — Patient Instructions (Addendum)
Thank you for coming in today.   You received an injection today. Seek immediate medical attention if the joint becomes red, extremely painful, or is oozing fluid.   Please get an Xray today before you leave   You should hear from MRI scheduling within 1 week. If you do not hear please let me know.    Check back after MRI

## 2022-08-20 NOTE — Progress Notes (Signed)
Right elbow x-ray does not show any fractures.  There is a little bit of bone spurring present at the triceps tendon insertion at the back of the elbow but otherwise looks okay.

## 2022-08-27 ENCOUNTER — Ambulatory Visit: Payer: 59 | Admitting: Sports Medicine

## 2022-08-28 ENCOUNTER — Ambulatory Visit: Payer: 59 | Admitting: Sports Medicine

## 2022-09-04 ENCOUNTER — Ambulatory Visit (INDEPENDENT_AMBULATORY_CARE_PROVIDER_SITE_OTHER): Payer: 59 | Admitting: Sports Medicine

## 2022-09-04 ENCOUNTER — Ambulatory Visit (INDEPENDENT_AMBULATORY_CARE_PROVIDER_SITE_OTHER): Payer: 59

## 2022-09-04 DIAGNOSIS — G8929 Other chronic pain: Secondary | ICD-10-CM

## 2022-09-04 DIAGNOSIS — M25511 Pain in right shoulder: Secondary | ICD-10-CM

## 2022-09-04 MED ORDER — TRIAMCINOLONE ACETONIDE 40 MG/ML IJ SUSP: 40.0000 mg | Freq: Once | INTRAMUSCULAR | Status: DC

## 2022-09-04 NOTE — Progress Notes (Signed)
    Procedures performed today:    Procedure: Real-time Ultrasound Guided gadolinium contrast injection of right glenohumeral joint Device: Samsung HS60  Verbal informed consent obtained.  Time-out conducted.  Noted no overlying erythema, induration, or other signs of local infection.  Skin prepped in a sterile fashion.  Local anesthesia: Topical Ethyl chloride.  With sterile technique and under real time ultrasound guidance: Normal-appearing joint, 22-gauge spinal needle advanced in from a posterior approach, I then injected 1 cc kenalog 40, 2 cc lidocaine, 2 cc bupivacaine, syringe switched and 0.1 mL gadolinium injected, syringe again switched and 10 cc sterile saline used to fully distend the joint. Joint visualized and capsule seen distending confirming intra-articular placement of contrast material and medication. Completed without difficulty  Advised to call if fevers/chills, erythema, induration, drainage, or persistent bleeding.  Images permanently stored in PACS Impression: Technically successful ultrasound guided gadolinium contrast injection for MR arthrography.  Please see separate MR arthrogram report.  Independent interpretation of notes and tests performed by another provider:   None.  Brief History, Exam, Impression, and Recommendations:    Chronic right shoulder pain Ultrasound-guided arthrography injection as above. Further management per primary treating provider.    ____________________________________________ Gwen Her. Dianah Field, M.D., ABFM., CAQSM., AME. Primary Care and Sports Medicine [facility]  Adjunct Professor of [employer]  Risk manager

## 2022-09-04 NOTE — Assessment & Plan Note (Signed)
Ultrasound-guided arthrography injection as above. Further management per primary treating provider.

## 2022-09-05 NOTE — Progress Notes (Signed)
Right shoulder MRI shows a partial-thickness rotator cuff tear and rotator cuff tendinitis.  There is also a labrum tear in the shoulder and tendinitis of the biceps tendon. Recommend return to clinic to talk about the results and treatment plan and options.  This could be treated surgically but you may want to prioritize other issues.

## 2022-09-11 ENCOUNTER — Other Ambulatory Visit: Payer: Self-pay | Admitting: Cardiovascular Disease

## 2022-09-13 ENCOUNTER — Telehealth: Payer: Self-pay | Admitting: Family Medicine

## 2022-09-13 NOTE — Telephone Encounter (Signed)
Spoke w/ pt and conveyed Dr. Clovis Riley result note. Pt verbalized understanding. Scheduled f/u visit for 3/22.

## 2022-09-13 NOTE — Telephone Encounter (Signed)
Patient called back in response to a voicemail from [facility] in regards to his MRI results. Can someone call him back to go over them?

## 2022-09-20 NOTE — Progress Notes (Signed)
Shirlyn Goltz, PhD, LAT, ATC acting as a scribe for Lynne Leader, MD.  Craig Copeland is a 67 y.o. male who presents to [place] at [facility] today for f/u R hip and R shoulder pain w/ MRI arthrogram review. Pt was last seen by Dr. Georgina Snell on 08/17/22 and was given a right intra-articular hip injection and advised to proceed to MRI arthrogram for the shoulder.  Today, patient reports no change in shoulder sx. Denies new injury or worsening sx.  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  He has had steroid injections in the hip which have been working for about 3 months so far.  Dx testing: 09/04/2022 right shoulder MRI arthrogram  08/17/2022 right elbow x-ray  05/16/2022 R shoulder & R hip XR  08/28/17 L LE Vasc US  Pertinent review of systems: No fevers or chills  Relevant historical information: Hypertension.  Obesity.   Exam:  BP 118/76   Pulse 71   Ht 6\' 1"  (1.854 m)   Wt (!) 301 lb 6.4 oz (136.7 kg)   SpO2 98%   BMI 39.76 kg/m  General: Well Developed, well nourished, and in no acute distress.   MSK: Right shoulder: Normal-appearing Reduced abduction range of motion. Strength reduced abduction. Positive Hawkins and Neer's test.  Right hip reduced range of motion flexion and internal rotation.    Lab and Radiology Results  EXAM: MRI OF THE RIGHT SHOULDER WITH CONTRAST   TECHNIQUE: Multiplanar, multisequence MR imaging of the right shoulder was performed following the administration of intra-articular contrast.   CONTRAST:  See Injection Documentation.   COMPARISON:  None Available.   FINDINGS: Rotator cuff: Moderate tendinosis of the supraspinatus tendon with a partial-thickness articular surface tear anteriorly. Moderate tendinosis of the infraspinatus tendon. Teres minor tendon is intact. Subscapularis tendon is intact.   Muscles: No muscle atrophy or edema. No intramuscular fluid collection or hematoma.   Biceps  Long Head: Mild tendinosis of the intra-articular portion of the long head of the biceps tendon.   Acromioclavicular Joint: Moderate arthropathy of the acromioclavicular joint. Trace subacromial/subdeltoid bursal fluid.   Glenohumeral Joint: Intraarticular contrast distending the joint capsule. Normal glenohumeral ligaments. No chondral defect.   Labrum: Superior labral tear. Degeneration of the anterior inferior labrum.   Bones: No fracture or dislocation. No aggressive osseous lesion.   Other: No fluid collection or hematoma.   IMPRESSION: 1. Moderate tendinosis of the supraspinatus tendon with a partial-thickness articular surface tear anteriorly. 2. Moderate tendinosis of the infraspinatus tendon. 3. Mild tendinosis of the intra-articular portion of the long head of the biceps tendon. 4. Superior labral tear.     Electronically Signed   By: Kathreen Devoid M.D.   On: 09/05/2022 05:41  EXAM: DG HIP (WITH OR WITHOUT PELVIS) 2-3V RIGHT   COMPARISON:  None Available.   FINDINGS: Diffuse decreased bone mineralization. Mild right-greater-than-left femoroacetabular joint space narrowing. Moderate superolateral right acetabular degenerative osteophytosis. On frontal view mild superolateral left acetabular degenerative osteophytosis.   Mild bilateral sacroiliac subchondral sclerosis.   The pubic symphysis joint space is maintained.   No acute fracture or dislocation. Vascular phleboliths overlie the left hemipelvis.   IMPRESSION: Mild right-greater-than-left femoroacetabular osteoarthritis.     Electronically Signed   By: Yvonne Kendall M.D.   On: 05/17/2022 11:07   I, Lynne Leader, personally (independently) visualized and performed the interpretation of the images attached in this note.     Assessment and Plan: 67  y.o. male with  Right shoulder pain due to rotator cuff partial tear and tendinopathy.  Additionally he does have a small labrum tear.  He is failing  typical conservative management including subacromial and glenohumeral injections.  He also had a trial of physical therapy.  At this point is worth this time to have a surgical consultation and talk about his surgical options.    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  This is currently reasonably well-managed with intermittent steroid injections.  However he will likely need a hip replacement at some point in the future.  The orthopedic surgeon he sees for his shoulder can also start talking to him a little bit about hip replacement options in the future.  I would like him to establish with an orthopedic surgeon he can build a good rapport with for these 2 potential upcoming surgeries and the not so distant future.   PDMP not reviewed this encounter. Orders Placed This Encounter  Procedures   Ambulatory referral to Orthopedic Surgery    Referral Priority:   Routine    Referral Type:   Surgical    Referral Reason:   Specialty Services Required    Requested Specialty:   Orthopedic Surgery    Number of Visits Requested:   1   No orders of the defined types were placed in this encounter.    Discussed warning signs or symptoms. Please see discharge instructions. Patient expresses understanding.   The above documentation has been reviewed and is accurate and complete Lynne Leader, M.D.  Total encounter time 30 minutes including face-to-face time with the patient and, reviewing past medical record, and charting on the date of service.

## 2022-09-21 ENCOUNTER — Ambulatory Visit (INDEPENDENT_AMBULATORY_CARE_PROVIDER_SITE_OTHER): Payer: 59 | Admitting: Family Medicine

## 2022-09-21 ENCOUNTER — Encounter: Payer: Self-pay | Admitting: Family Medicine

## 2022-09-21 VITALS — BP 118/76 | HR 71 | Ht 73.0 in | Wt 301.4 lb

## 2022-09-21 DIAGNOSIS — M25551 Pain in right hip: Secondary | ICD-10-CM

## 2022-09-21 DIAGNOSIS — M25511 Pain in right shoulder: Secondary | ICD-10-CM | POA: Diagnosis not present

## 2022-09-21 DIAGNOSIS — G8929 Other chronic pain: Secondary | ICD-10-CM | POA: Diagnosis not present

## 2022-09-21 NOTE — Patient Instructions (Addendum)
Thank you for coming in today.   Plan for orthopedics eval for your shoulder and your hip a little.   Let me know if you have a problem.   Dr Marlou Sa, Dr Ninfa Linden or Dr Erlinda Hong

## 2022-09-23 ENCOUNTER — Other Ambulatory Visit: Payer: Self-pay | Admitting: Cardiovascular Disease

## 2022-10-03 ENCOUNTER — Telehealth: Payer: Self-pay | Admitting: Cardiovascular Disease

## 2022-10-03 NOTE — Telephone Encounter (Signed)
*  STAT* If patient is at the pharmacy, call can be transferred to refill team.   1. Which medications need to be refilled? (please list name of each medication and dose if known) isosorbide mononitrate (IMDUR) 30 MG 24 hr tablet   2. Which pharmacy/location (including street and city if local pharmacy) is medication to be sent to? [id number] [place] [place] [facility] [place]   3. Do they need a 30 day or 90 day supply? [place]

## 2022-10-04 ENCOUNTER — Other Ambulatory Visit: Payer: Self-pay

## 2022-10-04 MED ORDER — ISOSORBIDE MONONITRATE ER 30 MG PO TB24: ORAL_TABLET | ORAL | 0 refills | Status: DC

## 2022-10-08 NOTE — Progress Notes (Unsigned)
Office Visit Note   Patient: Craig Copeland           Date of Birth: June 10, 1956           MRN: [id number] Visit Date: 10/09/2022              Requested by: Rodolph Bong, MD [address] [place],  [place] [address] PCP: [name], MD   Assessment & Plan: Visit Diagnoses:  1. Chronic right shoulder pain   2. Primary osteoarthritis of right hip     Plan: Impression 67 year old gentleman with partial rotator cuff tear and SLAP tear and degenerative joint disease right hip.  I do not believe that he had a true dislocation but the pop was probably related to the SLAP tear.  At this point we discussed that the treatments are all based on his symptoms.  For now the injections and conservative managements are still effective.  He is interested in trying a glenohumeral injection but he would like to time these simultaneously with his hip injections so that they can all be done by Dr. Denyse Amass.  He has my card for the future if he wants to have a discussion on surgery.  Hip replacement handout provided.  Questions encouraged and answered.    Follow up instructions: No follow-ups on file.   Orders:  No orders of the defined types were placed in this encounter.  No orders of the defined types were placed in this encounter.     Procedures: No procedures performed   Clinical Data: No additional findings.   Subjective: Chief Complaint  Patient presents with   Right Shoulder - Pain   Right Hip - Pain    HPI  Craig Copeland is a very pleasant 67 year old gentleman referral from Dr. Denyse Amass for evaluation of chronic right shoulder and right hip pain.  He has been getting periodic hip injections every 3 months.  They do provide relief for about 2 and half months.  His right shoulder has been hurting about 3 months when he was in the workshop he felt something pop when his arm got torqued.  He does have a history of shoulder surgery about 7 years ago in that shoulder.  Review of  Systems  Constitutional: Negative.   HENT: Negative.    Eyes: Negative.   Respiratory: Negative.    Cardiovascular: Negative.   Gastrointestinal: Negative.   Endocrine: Negative.   Genitourinary: Negative.   Skin: Negative.   Allergic/Immunologic: Negative.   Neurological: Negative.   Hematological: Negative.   Psychiatric/Behavioral: Negative.    All other systems reviewed and are negative.    Objective: Vital Signs: There were no vitals taken for this visit.  Physical Exam Vitals and nursing note reviewed.  Constitutional:      Appearance: He is well-developed.  HENT:     Head: Normocephalic and atraumatic.  Eyes:     Pupils: Pupils are equal, round, and reactive to light.  Pulmonary:     Effort: Pulmonary effort is normal.  Abdominal:     Palpations: Abdomen is soft.  Musculoskeletal:        General: Normal range of motion.     Cervical back: Neck supple.  Skin:    General: Skin is warm.  Neurological:     Mental Status: He is alert and oriented to person, place, and time.  Psychiatric:        Behavior: Behavior normal.        Thought Content: Thought content  normal.        Judgment: Judgment normal.    Ortho Exam  Examination right hip shows mild restriction range of motion.  Reproducible hip pain with movement of the hip joint. Examination of right shoulder shows mild restriction passive range of motion.  No real pain with impingement or AC joint testing.  He has a positive O'Brien sign.  Mild pain with empty can without weakness.  Specialty Comments:  No specialty comments available.  Imaging: No results found.   PMFS History: Patient Active Problem List   Diagnosis Date Noted   Chronic right shoulder pain 09/04/2022   Primary osteoarthritis of right hip 05/16/2022   Osteoarthritis of right knee 03/22/2017   S/P knee replacement 03/22/2017   Chest pain with high risk for cardiac etiology 08/28/2016   Murmur, cardiac 04/01/2015   Angina pectoris  syndrome 12/18/2012   CAD (coronary artery disease) 12/18/2012   Hyperlipidemia 12/18/2012   Essential hypertension 12/18/2012   Morbid obesity 12/18/2012   Obstructive sleep apnea 12/18/2012   Past Medical History:  Diagnosis Date   CAD (coronary artery disease)    Dizziness    Dyslipidemia    Flu 06/2016   Had again 01/208 with PNA   Obesity    OSA (obstructive sleep apnea)    Systemic hypertension    Vasospastic angina (HCC)     Family History  Problem Relation Age of Onset   Heart failure Father    Hypertension Father    Heart failure Maternal Grandmother    Stroke Paternal Grandmother    Cancer Paternal Grandfather    Alzheimer's disease Paternal Grandfather     Past Surgical History:  Procedure Laterality Date   CARDIAC CATHETERIZATION  12/02/2002   <20% restenosis RCA   CARDIAC CATHETERIZATION  01/30/2010   20-30% in-stent restenosis,60-70% ostial stenosis   CORONARY ANGIOPLASTY WITH STENT PLACEMENT  11/13/2002   mid RCA   KNEE SURGERY  2003   LEFT HEART CATH AND CORONARY ANGIOGRAPHY N/A 09/05/2016   Procedure: Left Heart Cath and Coronary Angiography;  Surgeon: Kathleene Hazel, MD;  Location: [facility] INVASIVE CV LAB;  Service: Cardiovascular;  Laterality: N/A;   LEFT HEART CATHETERIZATION WITH CORONARY ANGIOGRAM N/A 12/22/2012   Procedure: LEFT HEART CATHETERIZATION WITH CORONARY ANGIOGRAM;  Surgeon: Thurmon Fair, MD;  Location: [facility] CATH LAB;  Service: Cardiovascular;  Laterality: N/A;   NOSE SURGERY     TOTAL KNEE ARTHROPLASTY Left 03/22/2017   Procedure: LEFT TOTAL KNEE ARTHROPLASTY;  Surgeon: Eugenia Mcalpine, MD;  Location: [facility] ORS;  Service: Orthopedics;  Laterality: Left;   VASECTOMY     Social History   Occupational History   Occupation: Optometrist: [employer]   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

## 2022-10-09 ENCOUNTER — Ambulatory Visit (INDEPENDENT_AMBULATORY_CARE_PROVIDER_SITE_OTHER): Payer: 59 | Admitting: Orthopaedic Surgery

## 2022-10-09 DIAGNOSIS — M1611 Unilateral primary osteoarthritis, right hip: Secondary | ICD-10-CM | POA: Diagnosis not present

## 2022-10-09 DIAGNOSIS — G8929 Other chronic pain: Secondary | ICD-10-CM

## 2022-10-09 DIAGNOSIS — M25511 Pain in right shoulder: Secondary | ICD-10-CM

## 2022-11-15 ENCOUNTER — Other Ambulatory Visit: Payer: Self-pay

## 2022-11-15 MED ORDER — ISOSORBIDE MONONITRATE ER 30 MG PO TB24: ORAL_TABLET | ORAL | 0 refills | Status: DC

## 2022-11-16 ENCOUNTER — Telehealth: Payer: Self-pay | Admitting: Cardiovascular Disease

## 2022-11-16 NOTE — Telephone Encounter (Signed)
*  STAT* If patient is at the pharmacy, call can be transferred to refill team.   1. Which medications need to be refilled? (please list name of each medication and dose if known)   isosorbide mononitrate (IMDUR) 30 MG 24 hr tablet    2. Which pharmacy/location (including street and city if local pharmacy) is medication to be sent to? [id number] [place], [place] - [address]   3. Do they need a 30 day or 90 day supply? Resend for 90 day, because of his insurance  Patient's wife states the refill needs to be sent in for 90 day supply NOT 30, because of his insurance.

## 2022-11-19 MED ORDER — ISOSORBIDE MONONITRATE ER 30 MG PO TB24: ORAL_TABLET | ORAL | 3 refills | Status: DC

## 2022-11-19 NOTE — Telephone Encounter (Signed)
Spoke to pharmacy and corrected quantity. Notified patient of corrected quantity and that refill was sent to pharmacy.

## 2022-11-19 NOTE — Telephone Encounter (Signed)
Pt is calling for refill request, please send 90 day request

## 2022-12-05 ENCOUNTER — Other Ambulatory Visit: Payer: Self-pay

## 2022-12-05 ENCOUNTER — Ambulatory Visit (INDEPENDENT_AMBULATORY_CARE_PROVIDER_SITE_OTHER): Payer: 59 | Admitting: Family Medicine

## 2022-12-05 ENCOUNTER — Encounter: Payer: Self-pay | Admitting: Family Medicine

## 2022-12-05 VITALS — BP 124/84 | HR 74 | Ht 73.0 in | Wt 300.4 lb

## 2022-12-05 DIAGNOSIS — M25511 Pain in right shoulder: Secondary | ICD-10-CM | POA: Diagnosis not present

## 2022-12-05 DIAGNOSIS — M25551 Pain in right hip: Secondary | ICD-10-CM | POA: Diagnosis not present

## 2022-12-05 DIAGNOSIS — G8929 Other chronic pain: Secondary | ICD-10-CM

## 2022-12-05 DIAGNOSIS — M1611 Unilateral primary osteoarthritis, right hip: Secondary | ICD-10-CM

## 2022-12-05 NOTE — Progress Notes (Signed)
I, Stevenson Clinch, CMA acting as a scribe for [name], MD.  Craig Copeland is a 67 y.o. male who presents to [facility] at [facility] today for cont'd R hip and R shoulder pain. Pt was last seen by Dr. Denyse Amass on 09/21/22 and he was referred to orthopedic surgery for a consultation on his R shoulder. He had a visit w/ Dr. Roda Shutters on 10/09/22. His last R interarticular hip injection was on 08/17/22.  Today, pt reports about 2.5 months of improvement of hip symptoms before sx returned. Hip pain is deep, denies catching in the joint. Mild sharp shooting pain into the leg. Hoping to proceed with hip replacement this coming winter.   For the right shoulder, also got about 1 month of relief s/p shoulder injection. ROM is well maintained overall but not without pain. Denies radicular sx.   Dx testing: 09/04/2022 R shoulder MRI arthrogram             08/17/2022 R elbow x-ray  05/16/2022 R shoulder & R hip XR  08/28/17 L LE Vasc US  Pertinent review of systems: No fevers or chills  Relevant historical information: Hypertension.   Exam:  BP 124/84   Pulse 74   Ht 6\' 1"  (1.854 m)   Wt (!) 300 lb 6.4 oz (136.3 kg)   SpO2 96%   BMI 39.63 kg/m  General: Well Developed, well nourished, and in no acute distress.   MSK: Right shoulder normal-appearing pain with abduction.  Strength mildly diminished abduction. Positive Hawkins and Neer's test.  Right hip normal-appearing decreased motion.    Lab and Radiology Results   Procedure: Real-time Ultrasound Guided Injection of right shoulder glenohumeral joint posterior approach Device: Philips Affiniti 50G Images permanently stored and available for review in PACS Verbal informed consent obtained.  Discussed risks and benefits of procedure. Warned about infection, bleeding, hyperglycemia damage to structures among others. Patient expresses understanding and agreement Time-out conducted.   Noted no overlying erythema, induration,  or other signs of local infection.   Skin prepped in a sterile fashion.   Local anesthesia: Topical Ethyl chloride.   With sterile technique and under real time ultrasound guidance: 40 mg of Kenalog and 2 mL of Marcaine injected into shoulder joint. Fluid seen entering the joint capsule.   Completed without difficulty   Pain immediately resolved suggesting accurate placement of the medication.   Advised to call if fevers/chills, erythema, induration, drainage, or persistent bleeding.   Images permanently stored and available for review in the ultrasound unit.  Impression: Technically successful ultrasound guided injection.    Procedure: Real-time Ultrasound Guided Injection of right hip anterior approach femoral acetabular joint Device: Philips Affiniti 50G Images permanently stored and available for review in PACS Verbal informed consent obtained.  Discussed risks and benefits of procedure. Warned about infection, bleeding, hyperglycemia damage to structures among others. Patient expresses understanding and agreement Time-out conducted.   Noted no overlying erythema, induration, or other signs of local infection.   Skin prepped in a sterile fashion.   Local anesthesia: Topical Ethyl chloride.   With sterile technique and under real time ultrasound guidance: 40 mg of Kenalog and 2 mL of Marcaine injected into hip joint. Fluid seen entering the joint capsule.   Completed without difficulty   Pain immediately resolved suggesting accurate placement of the medication.   Advised to call if fevers/chills, erythema, induration, drainage, or persistent bleeding.   Images permanently stored and available for review in the ultrasound  unit.  Impression: Technically successful ultrasound guided injection.         Assessment and Plan: 67 y.o. male with right shoulder pain thought to be due to rotator cuff partial tear and tendinopathy.  Plan for glenohumeral joint injection today.  Could  consider surgery in the future.  He is already been seen by Dr.Xu.  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  He is planning on having a hip replacement with Dr. Roda Shutters at the end of the year.  Injection today.  3 months from now we could do it again but he will be time-limited on when he can have his hip replacement then as both surgeons do not want to do hip replacement within 3 months of a steroid injection.  At the 57-month mark he should consider a repeat consultation with Dr. Roda Shutters.   PDMP not reviewed this encounter. Orders Placed This Encounter  Procedures   [place] LIMITED JOINT SPACE STRUCTURES UP RIGHT(NO LINKED CHARGES)    Order Specific Question:   Reason for Exam (SYMPTOM  OR DIAGNOSIS REQUIRED)    Answer:   right shoulder pain    Order Specific Question:   Preferred imaging location?    Answer:    [facility]   No orders of the defined types were placed in this encounter.    Discussed warning signs or symptoms. Please see discharge instructions. Patient expresses understanding.   The above documentation has been reviewed and is accurate and complete [name], M.D.

## 2022-12-05 NOTE — Patient Instructions (Addendum)
Thank you for coming in today.   You received an injection today. Seek immediate medical attention if the joint becomes red, extremely painful, or is oozing fluid.   Recheck as needed.  Next potential injection is Sep 5.

## 2023-01-06 ENCOUNTER — Emergency Department (HOSPITAL_COMMUNITY): Payer: 59

## 2023-01-06 ENCOUNTER — Other Ambulatory Visit: Payer: Self-pay

## 2023-01-06 ENCOUNTER — Emergency Department (HOSPITAL_COMMUNITY)
Admission: EM | Admit: 2023-01-06 | Discharge: 2023-01-06 | Disposition: A | Discharge status: Home / Self Care | Payer: 59 | Attending: Emergency Medicine | Admitting: Emergency Medicine

## 2023-01-06 DIAGNOSIS — R079 Chest pain, unspecified: Secondary | ICD-10-CM | POA: Insufficient documentation

## 2023-01-06 DIAGNOSIS — R1031 Right lower quadrant pain: Secondary | ICD-10-CM | POA: Diagnosis not present

## 2023-01-06 DIAGNOSIS — M79651 Pain in right thigh: Secondary | ICD-10-CM | POA: Diagnosis not present

## 2023-01-06 LAB — TROPONIN I (HIGH SENSITIVITY)
Troponin I (High Sensitivity): 8 ng/L (ref <18)
Troponin I (High Sensitivity): 8 ng/L (ref <18)

## 2023-01-06 LAB — BASIC METABOLIC PANEL
Anion gap: 14 (ref 5–15)
BUN: 20 mg/dL (ref 8–23)
CO2: 22 mmol/L (ref 22–32)
Calcium: 9.3 mg/dL (ref 8.9–10.3)
Chloride: 100 mmol/L (ref 98–111)
Creatinine, Ser: 0.98 mg/dL (ref 0.61–1.24)
GFR, Estimated: >60 mL/min (ref >60)
Glucose, Bld: 114 mg/dL — ABNORMAL HIGH (ref 70–99)
Potassium: 4 mmol/L (ref 3.5–5.1)
Sodium: 136 mmol/L (ref 135–145)

## 2023-01-06 LAB — CBC
HCT: 42.9 % (ref 39.0–52.0)
Hemoglobin: 14.2 g/dL (ref 13.0–17.0)
MCH: 30 pg (ref 26.0–34.0)
MCHC: 33.1 g/dL (ref 30.0–36.0)
MCV: 90.7 fL (ref 80.0–100.0)
Platelets: 226 10*3/uL (ref 150–400)
RBC: 4.73 MIL/uL (ref 4.22–5.81)
RDW: 13.1 % (ref 11.5–15.5)
WBC: 7.2 10*3/uL (ref 4.0–10.5)
nRBC: 0 % (ref 0.0–0.2)

## 2023-01-06 MED ORDER — ACETAMINOPHEN 500 MG PO TABS: 1000.0000 mg | ORAL_TABLET | Freq: Once | ORAL | Status: DC

## 2023-01-06 MED ORDER — IOHEXOL 350 MG/ML SOLN
75.0000 mL | Freq: Once | INTRAVENOUS | Status: AC | PRN
  Administered 2023-01-06: 75 mL via INTRAVENOUS

## 2023-01-06 MED ORDER — CEPHALEXIN 500 MG PO CAPS: 500.0000 mg | ORAL_CAPSULE | Freq: Two times a day (BID) | ORAL | 0 refills | Status: DC

## 2023-01-06 NOTE — ED Triage Notes (Signed)
Pt states pain feels tight but not bad enough for nitroglycerin.

## 2023-01-06 NOTE — ED Provider Notes (Signed)
[place] EMERGENCY DEPARTMENT AT [facility] Provider Note   CSN: [id number] Arrival date & time: 01/06/23  1418     History Chief Complaint  Patient presents with   Leg Pain   Chest Pain    HPI Craig Copeland is a 67 y.o. male presenting for chief complaint of right groin pain.  Initially he thought maybe he was having some lightheadedness and dizziness but states that those have resolved while he was waiting in the lobby.  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  He was stabilized medically as they could not intervene procedurally and recommended to follow-up in the outpatient setting.  He denies fevers chills nausea vomiting syncope or shortness of breath.  He is otherwise ambulatory tolerating p.o. intake at this time. He states that his right groin access site has substantial discomfort.  Exquisitely tender to palpation. Patient's recorded medical, surgical, social, medication list and allergies were reviewed in the Snapshot window as part of the initial history.   Review of Systems   Review of Systems  Constitutional:  Negative for chills and fever.  HENT:  Negative for ear pain and sore throat.   Eyes:  Negative for pain and visual disturbance.  Respiratory:  Negative for cough and shortness of breath.   Cardiovascular:  Negative for chest pain and palpitations.  Gastrointestinal:  Negative for abdominal pain and vomiting.  Genitourinary:  Negative for dysuria and hematuria.  Musculoskeletal:  Negative for arthralgias and back pain.  Skin:  Negative for color change and rash.  Neurological:  Negative for seizures and syncope.  All other systems reviewed and are negative.   Physical Exam Updated Vital Signs BP 110/67   Pulse (!) 59   Temp 98.2 F (36.8 C) (Oral)   Resp 20   Ht 6\' 1"  (1.854 m)   Wt 132.5 kg   SpO2 98%   BMI 38.52 kg/m  Physical Exam Vitals and nursing note reviewed.   Constitutional:      General: He is not in acute distress.    Appearance: He is well-developed.  HENT:     Head: Normocephalic and atraumatic.  Eyes:     Conjunctiva/sclera: Conjunctivae normal.  Cardiovascular:     Rate and Rhythm: Normal rate and regular rhythm.     Heart sounds: No murmur heard. Pulmonary:     Effort: Pulmonary effort is normal. No respiratory distress.     Breath sounds: Normal breath sounds.  Abdominal:     Palpations: Abdomen is soft.     Tenderness: There is no abdominal tenderness.  Musculoskeletal:        General: Tenderness (Patient has localized exquisite tenderness to palpation of his right groin.  Some overlying soft tissue swelling and bruising appreciated.) present. No swelling.     Cervical back: Neck supple.  Skin:    General: Skin is warm and dry.     Capillary Refill: Capillary refill takes less than 2 seconds.  Neurological:     Mental Status: He is alert.  Psychiatric:        Mood and Affect: Mood normal.      ED Course/ Medical Decision Making/ A&P    Procedures Procedures   Medications Ordered in ED Medications  acetaminophen (TYLENOL) tablet 1,000 mg (1,000 mg Oral Patient Refused/Not Given 01/06/23 1743)  iohexol (OMNIPAQUE) 350 MG/ML injection 75 mL (75 mLs Intravenous Contrast Given 01/06/23 1804)  Medical Decision Making:    [name] is a 67 y.o. male who presented to the ED today with right groin pain detailed above.     Additional history discussed with patient's family/caregivers.  External chart has been reviewed including [facility] catheterization report. Patient placed on continuous vitals and telemetry monitoring while in ED which was reviewed periodically.   Complete initial physical exam performed, notably the patient  was hemodynamically stable in no acute distress currently benign exam unless palpated right at the site of his prior access.      Reviewed and confirmed nursing documentation for past  medical history, family history, social history.    Initial Assessment:   With the patient's presentation of right groin pain, most likely diagnosis is pseudoaneurysm formation versus hematoma formation versus localized cellulitis/infection. Other diagnoses were considered including (but not limited to) septic arthritis, progression of his known arthritis, intra-abdominal hernia, appendicitis, cholecystitis, urinary tract infection. These are considered less likely due to history of present illness and physical exam findings.   This is most consistent with an acute life/limb threatening illness complicated by underlying chronic conditions.  Initial Plan:  CT abdomen pelvis with contrast to evaluate for aneurysmal disease,Hematoma formation or any other intra-abdominal structural pathology. Screening labs including CBC and Metabolic panel to evaluate for infectious or metabolic etiology of disease.  Urinalysis with reflex culture ordered to evaluate for UTI or relevant urologic/nephrologic pathology.  CXR to evaluate for structural/infectious intrathoracic pathology.  Troponin/EKG to evaluate for cardiac pathology to evaluate for cardiac nature of patient's symptoms. Objective evaluation as below reviewed with plan for close reassessment  Initial Study Results:   Laboratory  All laboratory results reviewed without evidence of clinically relevant pathology.     EKG EKG was reviewed independently. Rate, rhythm, axis, intervals all examined and without medically relevant abnormality. ST segments without concerns for elevations.    Radiology  All images reviewed independently. Agree with radiology report at this time.   CT ABDOMEN PELVIS W CONTRAST  Result Date: 01/06/2023 CLINICAL DATA:  Abdominal pain EXAM: CT ABDOMEN AND PELVIS WITH CONTRAST TECHNIQUE: Multidetector CT imaging of the abdomen and pelvis was performed using the standard protocol following bolus administration of intravenous  contrast. RADIATION DOSE REDUCTION: This exam was performed according to the departmental dose-optimization program which includes automated exposure control, adjustment of the mA and/or kV according to patient size and/or use of iterative reconstruction technique. CONTRAST:  75mL OMNIPAQUE IOHEXOL 350 MG/ML SOLN COMPARISON:  None Available. FINDINGS: Lower chest: No acute abnormality. Left and right coronary artery calcifications and stents. Hepatobiliary: No solid liver abnormality is seen. Hepatomegaly, maximum coronal span 22.8 cm. Hepatic steatosis no gallstones, gallbladder wall thickening, or biliary dilatation. Pancreas: Unremarkable. No pancreatic ductal dilatation or surrounding inflammatory changes. Spleen: Normal in size without significant abnormality. Adrenals/Urinary Tract: Adrenal glands are unremarkable. Kidneys are normal, without renal calculi, solid lesion, or hydronephrosis. Bladder is unremarkable. Stomach/Bowel: Stomach is within normal limits. Appendix appears normal. No evidence of bowel wall thickening, distention, or inflammatory changes. Moderate burden of stool and stool balls throughout the colon and rectum. Vascular/Lymphatic: Aortic atherosclerosis. No enlarged abdominal or pelvic lymph nodes. Reproductive: No mass or other significant abnormality. Other: Small, fat containing umbilical hernia.  No ascites. Musculoskeletal: No acute or significant osseous findings. IMPRESSION: 1. No acute CT findings of the abdomen or pelvis to explain abdominal pain. 2. Moderate burden of stool and stool balls throughout the colon and rectum. 3. Hepatomegaly and hepatic steatosis. 4.  Coronary artery disease. Aortic Atherosclerosis (ICD10-I70.0). Electronically Signed   By: Jearld Lesch M.D.   On: 01/06/2023 18:09   DG Chest 2 View  Result Date: 01/06/2023 CLINICAL DATA:  Chest pain. EXAM: CHEST - 2 VIEW COMPARISON:  10/30/2018. FINDINGS: Cardiac silhouette is normal in size. No mediastinal or  hilar masses. No evidence of adenopathy. Clear lungs.  No pleural effusion or pneumothorax. Skeletal structures are intact IMPRESSION: No active cardiopulmonary disease. Electronically Signed   By: Amie Portland M.D.   On: 01/06/2023 15:47     Reassessment and Plan:   Since being observed in the emergency department.  Symptoms are grossly improved.  CT scan is without pseudoaneurysm, hematoma or hemorrhage.  Patient has been ambulatory to the restroom endorses gross improvement of symptoms with just observational care at this time.  He feels comfortable following up with his primary care provider in the outpatient setting.  Strict return precautions reinforced the patient expressed understanding.  Serial troponins negative without acute evidence of any ACS.  Patient has been without any chest pain since his last discharge.  No acute indication for further intervention at this time.   Disposition:  I have considered need for hospitalization, however, considering all of the above, I believe this patient is stable for discharge at this time.  Patient/family educated about specific return precautions for given chief complaint and symptoms.  Patient/family educated about follow-up with PCP.     Patient/family expressed understanding of return precautions and need for follow-up. Patient spoken to regarding all imaging and laboratory results and appropriate follow up for these results. All education provided in verbal form with additional information in written form. Time was allowed for answering of patient questions. Patient discharged.    Emergency Department Medication Summary:   Medications  acetaminophen (TYLENOL) tablet 1,000 mg (1,000 mg Oral Patient Refused/Not Given 01/06/23 1743)  iohexol (OMNIPAQUE) 350 MG/ML injection 75 mL (75 mLs Intravenous Contrast Given 01/06/23 1804)    Clinical Impression:  1. Chest pain, unspecified type      Discharge   Final Clinical Impression(s) / ED  Diagnoses Final diagnoses:  Chest pain, unspecified type    Rx / DC Orders ED Discharge Orders          Ordered    cephALEXin (KEFLEX) 500 MG capsule  2 times daily        01/06/23 1857              Glyn Ade, MD 01/06/23 2236

## 2023-01-06 NOTE — ED Triage Notes (Signed)
Pt had cardiac cath last Monday and last Wednesday at [facility]. Monday they went through right wrist and Wednesday they went through the right femoral artery. Pt states today pain in right thigh starting from incision down to knee. No swelling or redness. Pt states right upper leg feels like something is poking leg worse with walking

## 2023-01-08 NOTE — Progress Notes (Unsigned)
Office Visit    Patient Name: Craig Copeland Date of Encounter: 01/08/2023  Primary Care Provider:  [name], MD Primary Cardiologist:  Thurmon Fair, MD  Chief Complaint  67 year old male past medical history of coronary artery disease, hypertension and hyperlipidemia.  Presents for follow-up today for coronary artery disease. Past Medical History    Past Medical History:  Diagnosis Date   CAD (coronary artery disease)    Dizziness    Dyslipidemia    Flu 06/2016   Had again 01/208 with PNA   Obesity    OSA (obstructive sleep apnea)    Systemic hypertension    Vasospastic angina [facility])    Past Surgical History:  Procedure Laterality Date   CARDIAC CATHETERIZATION  12/02/2002   <20% restenosis RCA   CARDIAC CATHETERIZATION  01/30/2010   20-30% in-stent restenosis,60-70% ostial stenosis   CORONARY ANGIOPLASTY WITH STENT PLACEMENT  11/13/2002   mid RCA   KNEE SURGERY  2003   LEFT HEART CATH AND CORONARY ANGIOGRAPHY N/A 09/05/2016   Procedure: Left Heart Cath and Coronary Angiography;  Surgeon: Kathleene Hazel, MD;  Location: [facility] INVASIVE CV LAB;  Service: Cardiovascular;  Laterality: N/A;   LEFT HEART CATHETERIZATION WITH CORONARY ANGIOGRAM N/A 12/22/2012   Procedure: LEFT HEART CATHETERIZATION WITH CORONARY ANGIOGRAM;  Surgeon: Thurmon Fair, MD;  Location: [facility] CATH LAB;  Service: Cardiovascular;  Laterality: N/A;   NOSE SURGERY     TOTAL KNEE ARTHROPLASTY Left 03/22/2017   Procedure: LEFT TOTAL KNEE ARTHROPLASTY;  Surgeon: Eugenia Mcalpine, MD;  Location: [facility] ORS;  Service: Orthopedics;  Laterality: Left;   VASECTOMY      Allergies  Allergies  Allergen Reactions   Amoxicillin-Pot Clavulanate Other (See Comments)     Labs/Other Studies Reviewed    The following studies were reviewed today: *** Cardiac Studies & Procedures   CARDIAC CATHETERIZATION  CARDIAC CATHETERIZATION 09/05/2016  Narrative  Prox RCA to Dist RCA lesion, 20 %stenosed.  Ost Ramus  lesion, 70 %stenosed.  Ost 1st Diag to 1st Diag lesion, 20 %stenosed.  Mid LAD to Dist LAD lesion, 20 %stenosed.  Prox LAD lesion, 20 %stenosed.  The left ventricular ejection fraction is 50-55% by visual estimate.  The left ventricular systolic function is normal.  LV end diastolic pressure is normal.  There is no mitral valve regurgitation.  1. Single vessel CAD with patent stents proximal/mid RCA with minimal stent restenosis 2. Mild non-obstructive disease in the LAD with segment of intramyocardial bridging in the mid to distal LAD 3. Small caliber intermediate branch with moderately severe ostial stenosis, unchanged from cath in 2014. 4. Normal LV systolic function  Recommendations: Continue medical management of CAD.  Findings Coronary Findings Diagnostic  Dominance: Right  Left Anterior Descending Vessel is large.  Segment of intramyocardial bridging.  First Diagonal Branch Vessel is moderate in size.  First Septal Branch Vessel is small in size.  Second Diagonal Branch Vessel is small in size.  Second Septal Branch Vessel is small in size.  Third Diagonal Branch Vessel is small in size.  Third Septal Branch Vessel is small in size.  Ramus Intermedius Vessel is small.  Left Circumflex Vessel is large.  Second Obtuse Marginal Branch Vessel is large in size.  Third Obtuse Marginal Branch Vessel is small in size.  Right Coronary Artery The lesion was previously treated using a drug eluting stent over 2 years ago. Previously placed stent displays restenosis.  Intervention  No interventions have been documented.   STRESS TESTS  MYOCARDIAL PERFUSION IMAGING 04/14/2015  Narrative  The left ventricular ejection fraction is normal (55-65%).  Nuclear stress EF: 60%.  There was no ST segment deviation noted during stress.  Defect 1: There is a small defect of mild severity present in the basal inferior and mid inferior location. This is  likely due to diaphragmatic attenuation, but cannot rule out infarct with mild peri-infarct ischemia.  The study is normal.  This is a low risk study.   ECHOCARDIOGRAM  ECHOCARDIOGRAM COMPLETE 04/13/2015  Narrative *[facility]* [address] [place], [place] [address] [phone]  ------------------------------------------------------------------- Transthoracic Echocardiography  Patient:    Malikye, Collick MR #:       [id number] Study Date: 04/13/2015 Gender:     M Age:        58 Height:     185.4 cm Weight:     138.3 kg BSA:        2.73 m^2 Pt. Status: Room:  ORDERING     Thurmon Fair, MD REFERRING    Thurmon Fair, MD SONOGRAPHER  Clearence Ped, RCS PERFORMING   [facility], Outpatient ATTENDING    Nahser, Jr  cc:  ------------------------------------------------------------------- LV EF: 55% -   60%  ------------------------------------------------------------------- Indications:      785.2 Cardiac murmur (R01.1).  ------------------------------------------------------------------- Study Conclusions  - Left ventricle: The cavity size was normal. Wall thickness was increased in a pattern of mild LVH. Systolic function was normal. The estimated ejection fraction was in the range of 55% to 60%. Wall motion was normal; there were no regional wall motion abnormalities. Left ventricular diastolic function parameters were normal. - Aortic valve: Mildly calcified annulus. Trileaflet. - Mitral valve: There was trivial regurgitation. - Left atrium: The atrium was at the upper limits of normal in size. - Right atrium: Central venous pressure (est): 3 mm Hg. - Pulmonary arteries: Systolic pressure could not be accurately estimated. - Pericardium, extracardiac: There was no pericardial effusion.  Impressions:  - Mild LVH with LVEF 55-60% and grossly normal diastolic function. Upper normal left atrial chamber size. Aortic valve is  mildly sclerotic.  Transthoracic echocardiography.  M-mode, complete 2D, spectral Doppler, and color Doppler.  Birthdate:  Patient birthdate: 04-03-1956.  Age:  Patient is 67 yr old.  Sex:  Gender: male. BMI: 40.2 kg/m^2.  Blood pressure:     160/67  Patient status: Outpatient.  Study date:  Study date: 04/13/2015. Study time: 11:22 AM.  Location:  [facility]  -------------------------------------------------------------------  ------------------------------------------------------------------- Left ventricle:  The cavity size was normal. Wall thickness was increased in a pattern of mild LVH. Systolic function was normal. The estimated ejection fraction was in the range of 55% to 60%. Wall motion was normal; there were no regional wall motion abnormalities. Left ventricular diastolic function parameters were normal.  ------------------------------------------------------------------- Aortic valve:   Mildly calcified annulus. Trileaflet. Cusp separation was normal.  Doppler:  There was no significant regurgitation.  ------------------------------------------------------------------- Aorta:  Aortic root: The aortic root was normal in size.  ------------------------------------------------------------------- Mitral valve:   The valve appears to be grossly normal.    Doppler: There was trivial regurgitation.    Peak gradient (D): 3 mm Hg.  ------------------------------------------------------------------- Left atrium:  The atrium was at the upper limits of normal in size.  ------------------------------------------------------------------- Right ventricle:  The cavity size was normal. Systolic pressure was within the normal range.  ------------------------------------------------------------------- Pulmonic valve:    The valve appears to be grossly normal. Doppler:  There was physiologic  regurgitation.  -------------------------------------------------------------------  Tricuspid valve:   The valve appears to be grossly normal. Doppler:  There was trivial regurgitation.  ------------------------------------------------------------------- Pulmonary artery:    Systolic pressure could not be accurately estimated.  ------------------------------------------------------------------- Right atrium:  The atrium was normal in size.  ------------------------------------------------------------------- Pericardium:  There was no pericardial effusion.  ------------------------------------------------------------------- Systemic veins: Inferior vena cava: The vessel was normal in size. The respirophasic diameter changes were in the normal range (= 50%), consistent with normal central venous pressure. Diameter: 15 mm.  ------------------------------------------------------------------- Measurements  IVC                                         Value        Reference ID                                          15    mm     ---------  Left ventricle                              Value        Reference LV ID, ED, PLAX chordal                     45.4  mm     43 - 52 LV ID, ES, PLAX chordal                     31    mm     23 - 38 LV fx shortening, PLAX chordal              32    %      >=29 LV PW thickness, ED                         12.9  mm     --------- IVS/LV PW ratio, ED                         0.91         <=1.3 Stroke volume, 2D                           97    ml     --------- Stroke volume/bsa, 2D                       36    ml/m^2 --------- LV e&', lateral                              8.88  cm/s   --------- LV E/e&', lateral                            9.67         --------- LV e&', medial                               10.7  cm/s   --------- LV E/e&', medial  8.03         --------- LV e&', average                              9.79  cm/s    --------- LV E/e&', average                            8.77         ---------  Ventricular septum                          Value        Reference IVS thickness, ED                           11.7  mm     ---------  LVOT                                        Value        Reference LVOT ID, S                                  23    mm     --------- LVOT area                                   4.15  cm^2   --------- LVOT peak velocity, S                       112   cm/s   --------- LVOT mean velocity, S                       76.8  cm/s   --------- LVOT VTI, S                                 23.4  cm     --------- LVOT peak gradient, S                       5     mm Hg  ---------  Aorta                                       Value        Reference Aortic root ID, ED                          33    mm     ---------  Left atrium                                 Value        Reference LA ID, A-P, ES  41    mm     --------- LA ID/bsa, A-P                              1.5   cm/m^2 <=2.2 LA volume, S                                69    ml     --------- LA volume/bsa, S                            25.3  ml/m^2 --------- LA volume, ES, 1-p A4C                      67    ml     --------- LA volume/bsa, ES, 1-p A4C                  24.6  ml/m^2 --------- LA volume, ES, 1-p A2C                      69    ml     --------- LA volume/bsa, ES, 1-p A2C                  25.3  ml/m^2 ---------  Mitral valve                                Value        Reference Mitral E-wave peak velocity                 85.9  cm/s   --------- Mitral A-wave peak velocity                 77    cm/s   --------- Mitral deceleration time                    173   ms     150 - 230 Mitral peak gradient, D                     3     mm Hg  --------- Mitral E/A ratio, peak                      1.12         ---------  Tricuspid valve                             Value        Reference Tricuspid regurg peak  velocity              113   cm/s   --------- Tricuspid peak RV-RA gradient               5     mm Hg  --------- Tricuspid maximal regurg velocity,          113   cm/s   --------- PISA  Systemic veins                              Value  Reference Estimated CVP                               3     mm Hg  ---------  Right ventricle                             Value        Reference RV pressure, S, DP                          8     mm Hg  <=30 RV s&', lateral, S                           12.4  cm/s   ---------  Legend: (L)  and  (H)  mark values outside specified reference range.  ------------------------------------------------------------------- Prepared and Electronically Authenticated by  Nona Dell, M.D. 2016-10-12T15:55:34            Recent Labs: 01/06/2023: BUN 20; Creatinine, Ser 0.98; Hemoglobin 14.2; Platelets 226; Potassium 4.0; Sodium 136  Recent Lipid Panel    Component Value Date/Time   CHOL 137 04/08/2015 0804   TRIG 157 (H) 04/08/2015 0804   HDL 32 (L) 04/08/2015 0804   CHOLHDL 4.3 04/08/2015 0804   VLDL 31 (H) 04/08/2015 0804   LDLCALC 74 04/08/2015 0804    History of Present Illness  67 year old male with past medical history of coronary artery disease, vasospastic angina, hypertension, OSA and hyperlipidemia.  He has a history of previous stent to the RCA in 2004 with a DES overlapping another DES in the mid to proximal RCA, and a moderate to severe ostial lesion of a small to medium ramus intermedius artery that is too small for PCI. He had no changes on LHC in 2018.   He was last seen by Dr. Royann Shivers on 08/08/2022 for follow-up.  He denied any chest discomfort requiring the use of nitroglycerin in the last year.  Reported that if he misses a dose of medication he would develop some mild chest tightness and increased fatigue.   12/28/2022 he presented to [facility] ED dizziness and chest tightness.  His EKG was negative for ischemic changes, cardiac markers  were negative.  Per patient report he had been installing a hood over a fireplace and after going up and down the ladder a few times developed dizziness headache and visual disturbances and chest tightness.  He went home rested and the next day returned to his work with repeated symptoms, as such his wife came to the emergency room.  While inpatient he underwent nuclear stress test that was positive for inducible ischemia.  On 7/1 he underwent left heart cath that revealed a lesion in the RCA that could not be crossed, repeat LHC on 7/2 in which were unable to cross the lesion again and it was determined that he should undergo medical management.  His echo during admission indicated an EF of 60 to 65%, wall motion was normal there was mild asymmetric hypertrophy LV. He was started on metoprolol tartrate 25 mg twice daily and Ranexa 1000 mg daily.  His Imdur was increased to 60 mg daily.  Of note he was previously on a beta-blocker and was unable to tolerate in setting of bradycardia.  Per [facility] procedure notes: Unsuccessful PCI of mid right coronary  artery.  The procedure was aborted on account of inability to advance the interventional wire more than 20 to  30 mm distal to the lesion, and the inability to advance a 1.5 mm diameter balloon and low-profile exchange catheter across the lesion.  Failure may be due to severe calcification and rigidity of the vessel versus  inadvertent passing of the wire behind stent struts due to insufficient overlap of sequential stents in the proximal and mid RC.   He was seen in the ED on 01/06/2023 for right groin pain following left heart catheterization.  CT scan was without pseudoaneurysm, hematoma or hemorrhage.  Patient reported improvement in discomfort while in the ED and was discharged.   CAD s/p PCI RCA: Left heart cath at [facility] on 7/1 and 7/2 indicated stenosis of right coronary artery there were unable to advance the wire more than 20 to 30 mm distal  to the lesion, and the inability to advance a 1.5 mm diameter balloon and low-profile exchange catheter across the lesion.  Recommended that he undergo medical management. He was started on metoprolol tartrate 25 mg twice daily and Ranexa 1000 mg daily.  His Imdur was increased to 60 mg daily.  Of note he was previously on a beta-blocker and was unable to tolerate in setting of bradycardia.  Angina? Continue loaded pain, aspirin, Plavix, Zetia, hydrochlorothiazide, Imdur, losartan and pravastatin. HLD: Profile taken by [facility] on 12/31/2022 indicated total cholesterol of 115 and LDL of 62. Continue Zetia and pravastatin.  HTN: Blood pressure today: Blood pressure at home:  Continue amlodipine, hydrochlorothiazide, Imdur, losartan Morbid obesity:  OSA: Aortic sclerosis/AS: Echo from 12/31/2022 indicated a tricuspid aortic valve, leaflets were mildly calcified with no evidence of regurgitation or stenosis.  Home Medications    Current Outpatient Medications  Medication Sig Dispense Refill   amLODipine (NORVASC) 5 MG tablet TAKE 1 TABLET BY MOUTH EVERY DAY IN THE EVENING 90 tablet 3   aspirin EC 81 MG tablet Take 81 mg by mouth daily.     cephALEXin (KEFLEX) 500 MG capsule Take 1 capsule (500 mg total) by mouth 2 (two) times daily. 10 capsule 0   cholecalciferol (VITAMIN D) 1000 units tablet Take 1,000 Units by mouth daily.     clopidogrel (PLAVIX) 75 MG tablet Take 75 mg by mouth daily.     ezetimibe (ZETIA) 10 MG tablet Take 10 mg by mouth every evening.      fish oil-omega-3 fatty acids 1000 MG capsule Take 1 g by mouth daily.      hydrochlorothiazide (MICROZIDE) 12.5 MG capsule Take 12.5 mg by mouth every morning.     isosorbide mononitrate (IMDUR) 30 MG 24 hr tablet TAKE 2 TABLETS (60 MG TOTAL) DAILY. 180 tablet 3   losartan (COZAAR) 100 MG tablet Take 0.5 tablets (50 mg total) by mouth daily. 90 tablet 3   nitroGLYCERIN (NITROSTAT) 0.4 MG SL tablet PLACE 1 TABLET UNDER THE TONGUE EVERY 5  MINUTES AS NEEDED FOR CHEST PAIN. 25 tablet 2   pravastatin (PRAVACHOL) 80 MG tablet Take 80 mg by mouth at bedtime.      Current Facility-Administered Medications  Medication Dose Route Frequency Provider Last Rate Last Admin   triamcinolone acetonide (KENALOG-40) injection 40 mg  40 mg Intramuscular Once Monica Becton, MD         Review of Systems    ***.  All other systems reviewed and are otherwise negative except as noted above.    Physical Exam  VS:  There were no vitals taken for this visit. , BMI There is no height or weight on file to calculate BMI.     GEN: Well nourished, well developed, in no acute distress. HEENT: normal. Neck: Supple, no JVD, carotid bruits, or masses. Cardiac: RRR, no murmurs, rubs, or gallops. No clubbing, cyanosis, edema.  Radials/DP/PT 2+ and equal bilaterally.  Respiratory:  Respirations regular and unlabored, clear to auscultation bilaterally. GI: Soft, nontender, nondistended, BS + x 4. MS: no deformity or atrophy. Skin: warm and dry, no rash. Neuro:  Strength and sensation are intact. Psych: Normal affect.  Accessory Clinical Findings    ECG personally reviewed by me today -    - no acute changes.   Lab Results  Component Value Date   WBC 7.2 01/06/2023   HGB 14.2 01/06/2023   HCT 42.9 01/06/2023   MCV 90.7 01/06/2023   PLT 226 01/06/2023   Lab Results  Component Value Date   CREATININE 0.98 01/06/2023   BUN 20 01/06/2023   NA 136 01/06/2023   K 4.0 01/06/2023   CL 100 01/06/2023   CO2 22 01/06/2023   Lab Results  Component Value Date   ALT 44 12/18/2012   AST 27 12/18/2012   ALKPHOS 51 12/18/2012   BILITOT 0.8 12/18/2012   Lab Results  Component Value Date   CHOL 137 04/08/2015   HDL 32 (L) 04/08/2015   LDLCALC 74 04/08/2015   TRIG 157 (H) 04/08/2015   CHOLHDL 4.3 04/08/2015    Lab Results  Component Value Date   HGBA1C 5.6 12/24/2013    Assessment & Plan    1.  ***  No BP recorded.  {Refresh  Note OR Click here to enter BP  :1}***   Rip Harbour, NP 01/08/2023, 7:24 PM

## 2023-01-09 ENCOUNTER — Encounter: Payer: Self-pay | Admitting: General Practice

## 2023-01-09 ENCOUNTER — Ambulatory Visit: Payer: 59 | Attending: General Practice | Admitting: Cardiology

## 2023-01-09 VITALS — BP 124/72 | HR 68 | Ht 73.0 in | Wt 293.2 lb

## 2023-01-09 DIAGNOSIS — I25118 Atherosclerotic heart disease of native coronary artery with other forms of angina pectoris: Secondary | ICD-10-CM | POA: Diagnosis not present

## 2023-01-09 DIAGNOSIS — E7800 Pure hypercholesterolemia, unspecified: Secondary | ICD-10-CM | POA: Diagnosis not present

## 2023-01-09 DIAGNOSIS — R079 Chest pain, unspecified: Secondary | ICD-10-CM | POA: Diagnosis not present

## 2023-01-09 DIAGNOSIS — I358 Other nonrheumatic aortic valve disorders: Secondary | ICD-10-CM

## 2023-01-09 DIAGNOSIS — G4733 Obstructive sleep apnea (adult) (pediatric): Secondary | ICD-10-CM

## 2023-01-09 DIAGNOSIS — I10 Essential (primary) hypertension: Secondary | ICD-10-CM | POA: Diagnosis not present

## 2023-01-09 MED ORDER — ISOSORBIDE MONONITRATE ER 30 MG PO TB24
ORAL_TABLET | ORAL | 3 refills | Status: DC
Start: 2023-01-09 — End: 2023-06-18

## 2023-01-09 NOTE — Patient Instructions (Addendum)
Medication Instructions:  TAKE IMDUR 45MG  IN THE AM and 30MG  IN THE PM *If you need a refill on your cardiac medications before your next appointment, please call your pharmacy*   Lab Work: NONE If you have labs (blood work) drawn today and your tests are completely normal, you will receive your results only by:  MyChart Message (if you have MyChart) OR   A paper copy in the mail If you have any lab test that is abnormal or we need to change your treatment, we will call you to review the results.  Testing/Procedures: NONE  Follow-Up: At [facility], you and your health needs are our priority.  As part of our continuing mission to provide you with exceptional heart care, we have created designated Provider Care Teams.  These Care Teams include your primary Cardiologist (physician) and Advanced Practice Providers (APPs -  Physician Assistants and Nurse Practitioners) who all work together to provide you with the care you need, when you need it.  We recommend signing up for the patient portal called "MyChart".  Sign up information is provided on this After Visit Summary.  MyChart is used to connect with patients for Virtual Visits (Telemedicine).  Patients are able to view lab/test results, encounter notes, upcoming appointments, etc.  Non-urgent messages can be sent to your provider as well.   To learn more about what you can do with MyChart, go to [email or web address].    Your next appointment:   3-4 week(s)  Provider:   Thurmon Fair, MD  or Edd Fabian, FNP or [facility]  (on 7-31 THRU 8-2)    Other Instructions

## 2023-01-11 NOTE — Progress Notes (Signed)
Thanks, [employer].  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  I recommend giving Dr. Swaziland a chance to evaluate him.  We need to get him a copy of his films.

## 2023-01-14 ENCOUNTER — Inpatient Hospital Stay
Admission: RE | Admit: 2023-01-14 | Discharge: 2023-01-14 | Disposition: A | Discharge status: Home / Self Care | Payer: Self-pay | Source: Ambulatory Visit | Attending: Cardiology | Admitting: Cardiology

## 2023-01-14 ENCOUNTER — Other Ambulatory Visit: Payer: Self-pay | Admitting: Cardiology

## 2023-01-14 DIAGNOSIS — I2510 Atherosclerotic heart disease of native coronary artery without angina pectoris: Secondary | ICD-10-CM

## 2023-01-15 ENCOUNTER — Other Ambulatory Visit: Payer: Self-pay | Admitting: Cardiology

## 2023-01-15 ENCOUNTER — Inpatient Hospital Stay
Admission: RE | Admit: 2023-01-15 | Discharge: 2023-01-15 | Disposition: A | Discharge status: Home / Self Care | Payer: Self-pay | Source: Ambulatory Visit | Attending: Cardiology | Admitting: Cardiology

## 2023-01-15 DIAGNOSIS — I2510 Atherosclerotic heart disease of native coronary artery without angina pectoris: Secondary | ICD-10-CM

## 2023-01-17 LAB — LAB REPORT - SCANNED
A1c: 5.8
EGFR: 80

## 2023-01-29 ENCOUNTER — Telehealth: Payer: Self-pay | Admitting: *Deleted

## 2023-01-29 NOTE — Telephone Encounter (Addendum)
CTO/Coronary Atherectomy scheduled at [facility] for: Thursday January 31, 2023 10:30 AM Arrival time [facility] Main Entrance A at: 8:30 AM  Nothing to eat after midnight prior to procedure, clear liquids until 5 AM day of procedure.  Medication instructions: -Hold:  Hydrochlorothiazide-AM of procedure -Other usual morning medications can be taken with sips of water including aspirin 81 mg and Plavix 75 mg  Confirmed patient has responsible adult to drive home post procedure and be with patient first 24 hours after arriving home.  Patient reports Dr Swaziland told him to expect to stay overnight after procedure.  Reviewed procedure instructions with patient.

## 2023-01-30 ENCOUNTER — Encounter (HOSPITAL_COMMUNITY): Payer: Self-pay

## 2023-01-30 ENCOUNTER — Ambulatory Visit: Payer: 59 | Admitting: General Practice

## 2023-01-30 ENCOUNTER — Telehealth: Payer: Self-pay | Admitting: Cardiovascular Disease

## 2023-01-30 ENCOUNTER — Other Ambulatory Visit: Payer: Self-pay

## 2023-01-30 ENCOUNTER — Emergency Department (HOSPITAL_COMMUNITY): Payer: 59

## 2023-01-30 ENCOUNTER — Inpatient Hospital Stay (HOSPITAL_COMMUNITY)
Admission: EM | Admit: 2023-01-30 | Discharge: 2023-02-01 | DRG: 322 | Disposition: A | Discharge status: Home / Self Care | Payer: 59 | Attending: Cardiovascular Disease | Admitting: Cardiovascular Disease

## 2023-01-30 DIAGNOSIS — I200 Unstable angina: Secondary | ICD-10-CM | POA: Diagnosis not present

## 2023-01-30 DIAGNOSIS — Z8249 Family history of ischemic heart disease and other diseases of the circulatory system: Secondary | ICD-10-CM | POA: Diagnosis not present

## 2023-01-30 DIAGNOSIS — Z955 Presence of coronary angioplasty implant and graft: Secondary | ICD-10-CM

## 2023-01-30 DIAGNOSIS — Z823 Family history of stroke: Secondary | ICD-10-CM | POA: Diagnosis not present

## 2023-01-30 DIAGNOSIS — Z7902 Long term (current) use of antithrombotics/antiplatelets: Secondary | ICD-10-CM

## 2023-01-30 DIAGNOSIS — I10 Essential (primary) hypertension: Secondary | ICD-10-CM | POA: Diagnosis present

## 2023-01-30 DIAGNOSIS — I25110 Atherosclerotic heart disease of native coronary artery with unstable angina pectoris: Principal | ICD-10-CM | POA: Diagnosis present

## 2023-01-30 DIAGNOSIS — N179 Acute kidney failure, unspecified: Secondary | ICD-10-CM | POA: Diagnosis present

## 2023-01-30 DIAGNOSIS — E876 Hypokalemia: Secondary | ICD-10-CM | POA: Diagnosis present

## 2023-01-30 DIAGNOSIS — Z539 Procedure and treatment not carried out, unspecified reason: Secondary | ICD-10-CM | POA: Diagnosis not present

## 2023-01-30 DIAGNOSIS — Z79899 Other long term (current) drug therapy: Secondary | ICD-10-CM

## 2023-01-30 DIAGNOSIS — E669 Obesity, unspecified: Secondary | ICD-10-CM | POA: Diagnosis present

## 2023-01-30 DIAGNOSIS — T82855A Stenosis of coronary artery stent, initial encounter: Secondary | ICD-10-CM | POA: Diagnosis not present

## 2023-01-30 DIAGNOSIS — Y828 Other medical devices associated with adverse incidents: Secondary | ICD-10-CM | POA: Diagnosis not present

## 2023-01-30 DIAGNOSIS — Z820 Family history of epilepsy and other diseases of the nervous system: Secondary | ICD-10-CM

## 2023-01-30 DIAGNOSIS — Z7982 Long term (current) use of aspirin: Secondary | ICD-10-CM | POA: Diagnosis not present

## 2023-01-30 DIAGNOSIS — Z6838 Body mass index (BMI) 38.0-38.9, adult: Secondary | ICD-10-CM | POA: Diagnosis not present

## 2023-01-30 DIAGNOSIS — Z96652 Presence of left artificial knee joint: Secondary | ICD-10-CM | POA: Diagnosis present

## 2023-01-30 DIAGNOSIS — E785 Hyperlipidemia, unspecified: Secondary | ICD-10-CM | POA: Diagnosis present

## 2023-01-30 DIAGNOSIS — I25118 Atherosclerotic heart disease of native coronary artery with other forms of angina pectoris: Secondary | ICD-10-CM

## 2023-01-30 DIAGNOSIS — G8929 Other chronic pain: Secondary | ICD-10-CM

## 2023-01-30 DIAGNOSIS — Z881 Allergy status to other antibiotic agents status: Secondary | ICD-10-CM

## 2023-01-30 DIAGNOSIS — R079 Chest pain, unspecified: Principal | ICD-10-CM

## 2023-01-30 DIAGNOSIS — I2582 Chronic total occlusion of coronary artery: Secondary | ICD-10-CM | POA: Diagnosis not present

## 2023-01-30 DIAGNOSIS — G4733 Obstructive sleep apnea (adult) (pediatric): Secondary | ICD-10-CM | POA: Diagnosis present

## 2023-01-30 LAB — BASIC METABOLIC PANEL
Anion gap: 7 (ref 5–15)
Anion gap: 10 (ref 5–15)
BUN: 12 mg/dL (ref 8–23)
BUN: 12 mg/dL (ref 8–23)
CO2: 15 mmol/L — ABNORMAL LOW (ref 22–32)
CO2: 24 mmol/L (ref 22–32)
Calcium: 6.1 mg/dL — CL (ref 8.9–10.3)
Calcium: 9.3 mg/dL (ref 8.9–10.3)
Chloride: 118 mmol/L — ABNORMAL HIGH (ref 98–111)
Chloride: 104 mmol/L (ref 98–111)
Creatinine, Ser: 0.69 mg/dL (ref 0.61–1.24)
Creatinine, Ser: 1 mg/dL (ref 0.61–1.24)
GFR, Estimated: >60 mL/min (ref >60)
GFR, Estimated: >60 mL/min (ref >60)
Glucose, Bld: 88 mg/dL (ref 70–99)
Glucose, Bld: 123 mg/dL — ABNORMAL HIGH (ref 70–99)
Potassium: 2.8 mmol/L — ABNORMAL LOW (ref 3.5–5.1)
Potassium: 3.7 mmol/L (ref 3.5–5.1)
Sodium: 140 mmol/L (ref 135–145)
Sodium: 138 mmol/L (ref 135–145)

## 2023-01-30 LAB — HEPATIC FUNCTION PANEL
ALT: 45 U/L — ABNORMAL HIGH (ref 0–44)
AST: 32 U/L (ref 15–41)
Albumin: 3.8 g/dL (ref 3.5–5.0)
Alkaline Phosphatase: 43 U/L (ref 38–126)
Bilirubin, Direct: 0.2 mg/dL (ref 0.0–0.2)
Indirect Bilirubin: 1 mg/dL — ABNORMAL HIGH (ref 0.3–0.9)
Total Bilirubin: 1.2 mg/dL (ref 0.3–1.2)
Total Protein: 7 g/dL (ref 6.5–8.1)

## 2023-01-30 LAB — CBC WITH DIFFERENTIAL/PLATELET
Abs Immature Granulocytes: 0.02 10*3/uL (ref 0.00–0.07)
Basophils Absolute: 0 10*3/uL (ref 0.0–0.1)
Basophils Relative: 0 %
Eosinophils Absolute: 0.1 10*3/uL (ref 0.0–0.5)
Eosinophils Relative: 1 %
HCT: 39.1 % (ref 39.0–52.0)
Hemoglobin: 11.6 g/dL — ABNORMAL LOW (ref 13.0–17.0)
Immature Granulocytes: 0 %
Lymphocytes Relative: 23 %
Lymphs Abs: 1.1 10*3/uL (ref 0.7–4.0)
MCH: 29.4 pg (ref 26.0–34.0)
MCHC: 29.7 g/dL — ABNORMAL LOW (ref 30.0–36.0)
MCV: 99 fL (ref 80.0–100.0)
Monocytes Absolute: 0.6 10*3/uL (ref 0.1–1.0)
Monocytes Relative: 13 %
Neutro Abs: 3.2 10*3/uL (ref 1.7–7.7)
Neutrophils Relative %: 63 %
Platelets: 131 10*3/uL — ABNORMAL LOW (ref 150–400)
RBC: 3.95 MIL/uL — ABNORMAL LOW (ref 4.22–5.81)
RDW: 13.7 % (ref 11.5–15.5)
WBC: 5.1 10*3/uL (ref 4.0–10.5)
nRBC: 0 % (ref 0.0–0.2)

## 2023-01-30 LAB — MAGNESIUM: Magnesium: 2.1 mg/dL (ref 1.7–2.4)

## 2023-01-30 LAB — TROPONIN I (HIGH SENSITIVITY)
Troponin I (High Sensitivity): 4 ng/L (ref <18)
Troponin I (High Sensitivity): 5 ng/L (ref <18)

## 2023-01-30 LAB — TSH: TSH: 1.326 u[IU]/mL (ref 0.350–4.500)

## 2023-01-30 MED ORDER — METOPROLOL TARTRATE 25 MG PO TABS
25.0000 mg | ORAL_TABLET | Freq: Two times a day (BID) | ORAL | Status: DC
  Administered 2023-01-31 – 2023-02-01 (×3): 25 mg via ORAL
  Filled 2023-01-30 (×3): qty 1

## 2023-01-30 MED ORDER — ONDANSETRON HCL 4 MG/2ML IJ SOLN: 4.0000 mg | Freq: Four times a day (QID) | INTRAMUSCULAR | Status: DC | PRN

## 2023-01-30 MED ORDER — ACETAMINOPHEN 500 MG PO TABS
500.0000 mg | ORAL_TABLET | ORAL | Status: DC | PRN
  Administered 2023-01-31: 500 mg via ORAL
  Filled 2023-01-30: qty 1

## 2023-01-30 MED ORDER — SODIUM CHLORIDE 0.9 % WEIGHT BASED INFUSION: 3.0000 mL/kg/h | INTRAVENOUS | Status: DC

## 2023-01-30 MED ORDER — POTASSIUM CHLORIDE CRYS ER 20 MEQ PO TBCR
40.0000 meq | EXTENDED_RELEASE_TABLET | Freq: Once | ORAL | Status: AC
  Administered 2023-01-30: 40 meq via ORAL
  Filled 2023-01-30: qty 2

## 2023-01-30 MED ORDER — HEPARIN BOLUS VIA INFUSION
4000.0000 [IU] | Freq: Once | INTRAVENOUS | Status: AC
  Administered 2023-01-30: 4000 [IU] via INTRAVENOUS
  Filled 2023-01-30: qty 4000

## 2023-01-30 MED ORDER — PRAVASTATIN SODIUM 40 MG PO TABS
80.0000 mg | ORAL_TABLET | Freq: Every day | ORAL | Status: DC
  Administered 2023-01-30 – 2023-01-31 (×2): 80 mg via ORAL
  Filled 2023-01-30 (×2): qty 2

## 2023-01-30 MED ORDER — CLOPIDOGREL BISULFATE 75 MG PO TABS
75.0000 mg | ORAL_TABLET | Freq: Every day | ORAL | Status: DC
  Administered 2023-01-31: 75 mg via ORAL
  Filled 2023-01-30: qty 1

## 2023-01-30 MED ORDER — RANOLAZINE ER 500 MG PO TB12
1000.0000 mg | ORAL_TABLET | Freq: Two times a day (BID) | ORAL | Status: DC
  Administered 2023-01-30 – 2023-02-01 (×4): 1000 mg via ORAL
  Filled 2023-01-30 (×4): qty 2

## 2023-01-30 MED ORDER — ASPIRIN 325 MG PO TABS
325.0000 mg | ORAL_TABLET | Freq: Every day | ORAL | Status: DC
  Administered 2023-01-30: 325 mg via ORAL
  Filled 2023-01-30: qty 1

## 2023-01-30 MED ORDER — ASPIRIN 81 MG PO CHEW
81.0000 mg | CHEWABLE_TABLET | ORAL | Status: AC
  Administered 2023-01-31: 81 mg via ORAL
  Filled 2023-01-30: qty 1

## 2023-01-30 MED ORDER — ASPIRIN 81 MG PO TBEC
81.0000 mg | DELAYED_RELEASE_TABLET | Freq: Every day | ORAL | Status: DC
  Filled 2023-01-30: qty 1

## 2023-01-30 MED ORDER — CALCIUM GLUCONATE-NACL 1-0.675 GM/50ML-% IV SOLN
1.0000 g | Freq: Once | INTRAVENOUS | Status: AC
  Administered 2023-01-30: 1000 mg via INTRAVENOUS
  Filled 2023-01-30: qty 50

## 2023-01-30 MED ORDER — HEPARIN (PORCINE) 25000 UT/250ML-% IV SOLN
1300.0000 [IU]/h | INTRAVENOUS | Status: DC
  Administered 2023-01-30 – 2023-01-31 (×2): 1300 [IU]/h via INTRAVENOUS
  Filled 2023-01-30 (×2): qty 250

## 2023-01-30 MED ORDER — NITROGLYCERIN 0.4 MG SL SUBL: 0.4000 mg | SUBLINGUAL_TABLET | SUBLINGUAL | Status: DC | PRN

## 2023-01-30 MED ORDER — EZETIMIBE 10 MG PO TABS
10.0000 mg | ORAL_TABLET | Freq: Every evening | ORAL | Status: DC
  Administered 2023-01-30: 10 mg via ORAL
  Filled 2023-01-30: qty 1

## 2023-01-30 MED ORDER — SODIUM CHLORIDE 0.9 % WEIGHT BASED INFUSION
1.0000 mL/kg/h | INTRAVENOUS | Status: DC
  Administered 2023-01-31: 1 mL/kg/h via INTRAVENOUS

## 2023-01-30 NOTE — ED Provider Notes (Signed)
[place] EMERGENCY DEPARTMENT AT [facility] Provider Note   CSN: [id number] Arrival date & time: 01/30/23  1146     History  Chief Complaint  Patient presents with   Chest Pain    Craig Copeland is a 67 y.o. male.  67 year old male with known CAD with plan to have catheterization tomorrow presented emergency department after he developed sudden onset retrosternal chest pain with nausea, diaphoresis and shortness of breath.  Took his 4 nitros and symptoms of nearly resolved.  Feeling much improved.     Chest Pain      Home Medications Prior to Admission medications   Medication Sig Start Date End Date Taking? Authorizing Provider  acetaminophen (TYLENOL) 500 MG tablet Take 500 mg by mouth as needed for moderate pain.   Yes [provider]  amLODipine (NORVASC) 5 MG tablet TAKE 1 TABLET BY MOUTH EVERY DAY IN THE EVENING Patient taking differently: Take 2.5 mg by mouth every evening. 09/11/22  Yes Croitoru, Mihai, MD  aspirin EC 81 MG tablet Take 81 mg by mouth daily.   Yes [provider]  cholecalciferol (VITAMIN D) 1000 units tablet Take 1,000 Units by mouth daily.   Yes [provider]  clopidogrel (PLAVIX) 75 MG tablet Take 75 mg by mouth daily.   Yes [provider]  ezetimibe (ZETIA) 10 MG tablet Take 10 mg by mouth every evening.    Yes [provider]  fish oil-omega-3 fatty acids 1000 MG capsule Take 1 g by mouth daily.    Yes [provider]  hydrochlorothiazide (MICROZIDE) 12.5 MG capsule Take 12.5 mg by mouth every morning.   Yes [provider]  isosorbide mononitrate (IMDUR) 30 MG 24 hr tablet Take 1.5 tablets (45 mg total) by mouth every morning AND 1 tablet (30 mg total) every evening. 01/09/23  Yes West, Katlyn D, NP  losartan (COZAAR) 100 MG tablet Take 0.5 tablets (50 mg total) by mouth daily. 12/22/12  Yes Croitoru, Mihai, MD  metoprolol tartrate (LOPRESSOR) 25 MG tablet Take 25 mg by  mouth 2 (two) times daily. 01/01/23  Yes [provider]  nitroGLYCERIN (NITROSTAT) 0.4 MG SL tablet PLACE 1 TABLET UNDER THE TONGUE EVERY 5 MINUTES AS NEEDED FOR CHEST PAIN. 05/01/22  Yes Croitoru, Mihai, MD  pravastatin (PRAVACHOL) 80 MG tablet Take 80 mg by mouth at bedtime.    Yes [provider]  ranolazine (RANEXA) 1000 MG SR tablet Take 1,000 mg by mouth 2 (two) times daily. 01/01/23 01/31/23 Yes [provider]  cephALEXin (KEFLEX) 500 MG capsule Take 1 capsule (500 mg total) by mouth 2 (two) times daily. Patient not taking: Reported on 01/28/2023 01/06/23   Glyn Ade, MD      Allergies    Augmentin [amoxicillin-pot clavulanate]    Review of Systems   Review of Systems  Cardiovascular:  Positive for chest pain.  All other systems reviewed and are negative.   Physical Exam Updated Vital Signs BP (!) 90/42   Pulse (!) 55   Temp 98.2 F (36.8 C) (Oral)   Resp 19   SpO2 97%  Physical Exam Vitals and nursing note reviewed.  HENT:     Head: Normocephalic.  Cardiovascular:     Rate and Rhythm: Normal rate and regular rhythm.     Heart sounds: Normal heart sounds.  Pulmonary:     Breath sounds: Normal breath sounds.  Chest:     Chest wall: No tenderness.  Musculoskeletal:  Right lower leg: No edema.     Left lower leg: No edema.  Skin:    General: Skin is warm and dry.     Capillary Refill: Capillary refill takes less than 2 seconds.  Neurological:     General: No focal deficit present.     Mental Status: He is alert.  Psychiatric:        Mood and Affect: Mood normal.        Behavior: Behavior normal.     ED Results / Procedures / Treatments   Labs (all labs ordered are listed, but only abnormal results are displayed) Labs Reviewed  CBC WITH DIFFERENTIAL/PLATELET - Abnormal; Notable for the following components:      Result Value   RBC 3.95 (*)    Hemoglobin 11.6 (*)    MCHC 29.7 (*)    Platelets 131 (*)    All other components  within normal limits  BASIC METABOLIC PANEL - Abnormal; Notable for the following components:   Potassium 2.8 (*)    Chloride 118 (*)    CO2 15 (*)    Calcium 6.1 (*)    All other components within normal limits  TROPONIN I (HIGH SENSITIVITY)  TROPONIN I (HIGH SENSITIVITY)    EKG EKG Interpretation Date/Time:  Wednesday January 30 2023 11:57:25 EDT Ventricular Rate:  61 PR Interval:  212 QRS Duration:  106 QT Interval:  406 QTC Calculation: 409 R Axis:   -8  Text Interpretation: Sinus rhythm Borderline prolonged PR interval Inferior infarct, old Confirmed by Estanislado Pandy [phone]) on 01/30/2023 2:56:58 PM  Radiology DG Chest Portable 1 View  Result Date: 01/30/2023 CLINICAL DATA:  Chest pain. EXAM: PORTABLE CHEST 1 VIEW COMPARISON:  Chest x-ray dated January 06, 2023. FINDINGS: The heart size and mediastinal contours are within normal limits. Both lungs are clear. The visualized skeletal structures are unremarkable. IMPRESSION: No active disease. Electronically Signed   By: Obie Dredge M.D.   On: 01/30/2023 13:35    Procedures Procedures    Medications Ordered in ED Medications  aspirin tablet 325 mg (325 mg Oral Given 01/30/23 1336)  potassium chloride SA (KLOR-CON M) CR tablet 40 mEq (40 mEq Oral Given 01/30/23 1336)  calcium gluconate 1 g/ 50 mL sodium chloride IVPB (0 mg Intravenous Stopped 01/30/23 1432)    ED Course/ Medical Decision Making/ A&P Clinical Course as of 01/30/23 1629  Wed Jan 30, 2023  1513 BP 125/67 and not 90/42.  Spoke with cardiology PA on-call for possible admission given patient is due for heart catheterization tomorrow with concerning story for his chest pain.  They will see patient. [TY]  1530 Stable. HO from TY. Cards coming to see. Cath scheduled for tomorrow. Typical, concerning, story.  [CC]    Clinical Course User Index [CC] Glyn Ade, MD [TY] Coral Spikes, DO                                 Medical Decision Making 67 year old  male presented with chest pain.  He has known CAD. He is afebrile nontachycardic drinking on saturation room air.  Initial EKG appeared to be normal sinus rhythm with no ST segment changes and acute ischemia.  Labs with low potassium and low calcium which were repleted.  Mild anemia.  There was lab error with troponin were unable to results.  Resending troponin.  Given patient was due for catheterization tomorrow we will consult cardiology  for admission. See ED course for further MDM disposition.  Amount and/or Complexity of Data Reviewed Independent Historian: spouse    Details: Reported nearly 100% occlusion of one of the coronary arteries. External Data Reviewed: notes.    Details: Per cardiology note: "7/1: Left Heart Catheterization Summary: The angiogram showed a focal area of 99-100% luminal narrowing with TIMI I flow in the mid RCA. The occlusion appears to be due to the edge restenosis of 2 previously placed stents which do not appear to overlap. Attempts to advance a wire across the lesion were difficult but successful. However, attempts to complete the PCI were unsuccessful due to to a combination of heavy vessel calcification, tortuosity, and poor radial guide support. In addition, it is likely that the wire passed underneath a stent strut.  Calcification without luminal narrowing or stenosis of the left coronary artery. Left ventricular end-diastolic pressure 25 mmHg.  7/2: Repeat LHC with attempt to cross lesion in RCA. Attempt unsuccessful so the plan is to pursue aggressive medical management  " Labs: ordered. Decision-making details documented in ED Course. Radiology: ordered. Decision-making details documented in ED Course. ECG/medicine tests: ordered. Decision-making details documented in ED Course.  Risk OTC drugs. Prescription drug management. Decision regarding hospitalization.          Final Clinical Impression(s) / ED Diagnoses Final diagnoses:  Chest pain,  unspecified type    Rx / DC Orders ED Discharge Orders     None         Coral Spikes, DO 01/30/23 1629

## 2023-01-30 NOTE — ED Notes (Signed)
This RN spoke with pharmacy to verify if ordered night time medications would affect BP any further.  Pharmacy agrees with holding Metoprolol due to HR but states the rest of the medications are ok to give.

## 2023-01-30 NOTE — ED Notes (Signed)
Secretary paging admitting team for this RN due to diastolic readings fluctuating with current repeat MAP back to 64.

## 2023-01-30 NOTE — ED Notes (Signed)
ED TO INPATIENT HANDOFF REPORT  ED Nurse Name and Phone #: Joselyn Glassman [phone])  S Name/Age/Gender Craig Copeland 67 y.o. male Room/Bed: 033C/033C  Code Status   Code Status: Full Code  Home/SNF/Other Home Patient oriented to: self, place, time, and situation Is this baseline? Yes   Triage Complete: Triage complete  Chief Complaint Unstable angina (HCC) [I20.0]  Triage Note PT BIB EMS for chest pain, had 4 SL nitroglycerin, on July 1st, pt had a heart cath, and had an additional cath on July 2nd, and is supposed to have another cath tomorrow here at [facility] to work on stents in place that are blocked.   119/75 HR 67 O2 99% SR    Allergies Allergies  Allergen Reactions   Augmentin [Amoxicillin-Pot Clavulanate] Other (See Comments)    Pt unsure of Allergy     Level of Care/Admitting Diagnosis ED Disposition     ED Disposition  Admit   Condition  --   Comment  Hospital Area: [facility] [[id number]]  Level of Care: Telemetry Cardiac [[id number]]  May admit patient to [facility] or [facility] if equivalent level of care is available:: No  Covid Evaluation: Asymptomatic - no recent exposure (last 10 days) testing not required  Diagnosis: Unstable angina [facility]) [[id number]]  Admitting Physician: Tonny Bollman [[id number]]  Attending Physician: Tonny Bollman [[id number]]  Certification:: I certify this patient will need inpatient services for at least 2 midnights  Estimated Length of Stay: 3          B Medical/Surgery History Past Medical History:  Diagnosis Date   CAD (coronary artery disease)    Dizziness    Dyslipidemia    Flu 06/2016   Had again 01/208 with PNA   Obesity    OSA (obstructive sleep apnea)    Systemic hypertension    Vasospastic angina [facility])    Past Surgical History:  Procedure Laterality Date   CARDIAC CATHETERIZATION  12/02/2002   <20% restenosis RCA   CARDIAC CATHETERIZATION  01/30/2010   20-30% in-stent restenosis,60-70% ostial  stenosis   CORONARY ANGIOPLASTY WITH STENT PLACEMENT  11/13/2002   mid RCA   KNEE SURGERY  2003   LEFT HEART CATH AND CORONARY ANGIOGRAPHY N/A 09/05/2016   Procedure: Left Heart Cath and Coronary Angiography;  Surgeon: Kathleene Hazel, MD;  Location: [facility] INVASIVE CV LAB;  Service: Cardiovascular;  Laterality: N/A;   LEFT HEART CATHETERIZATION WITH CORONARY ANGIOGRAM N/A 12/22/2012   Procedure: LEFT HEART CATHETERIZATION WITH CORONARY ANGIOGRAM;  Surgeon: Thurmon Fair, MD;  Location: [facility] CATH LAB;  Service: Cardiovascular;  Laterality: N/A;   NOSE SURGERY     TOTAL KNEE ARTHROPLASTY Left 03/22/2017   Procedure: LEFT TOTAL KNEE ARTHROPLASTY;  Surgeon: Eugenia Mcalpine, MD;  Location: [facility] ORS;  Service: Orthopedics;  Laterality: Left;   VASECTOMY       A IV Location/Drains/Wounds Patient Lines/Drains/Airways Status     Active Line/Drains/Airways     Name Placement date Placement time Site Days   Peripheral IV 01/30/23 22 G 1" Anterior;Proximal;Right Forearm 01/30/23  --  Forearm  less than 1            Intake/Output Last 24 hours No intake or output data in the 24 hours ending 01/30/23 1947  Labs/Imaging Results for orders placed or performed during the hospital encounter of 01/30/23 (from the past 48 hour(s))  CBC with Differential     Status: Abnormal   Collection Time: 01/30/23 12:07 PM  Result Value Ref Range  WBC 5.1 4.0 - 10.5 K/uL   RBC 3.95 (L) 4.22 - 5.81 MIL/uL   Hemoglobin 11.6 (L) 13.0 - 17.0 g/dL   HCT 29.5 62.1 - 30.8 %   MCV 99.0 80.0 - 100.0 fL   MCH 29.4 26.0 - 34.0 pg   MCHC 29.7 (L) 30.0 - 36.0 g/dL   RDW 65.7 84.6 - 96.2 %   Platelets 131 (L) 150 - 400 K/uL   nRBC 0.0 0.0 - 0.2 %   Neutrophils Relative % 63 %   Neutro Abs 3.2 1.7 - 7.7 K/uL   Lymphocytes Relative 23 %   Lymphs Abs 1.1 0.7 - 4.0 K/uL   Monocytes Relative 13 %   Monocytes Absolute 0.6 0.1 - 1.0 K/uL   Eosinophils Relative 1 %   Eosinophils Absolute 0.1 0.0 - 0.5 K/uL   Basophils  Relative 0 %   Basophils Absolute 0.0 0.0 - 0.1 K/uL   Immature Granulocytes 0 %   Abs Immature Granulocytes 0.02 0.00 - 0.07 K/uL    Comment: Performed at [facility], [address], [place], [place] [address]  Basic metabolic panel     Status: Abnormal   Collection Time: 01/30/23 12:07 PM  Result Value Ref Range   Sodium 140 135 - 145 mmol/L   Potassium 2.8 (L) 3.5 - 5.1 mmol/L    Comment: RESULT CALLED TO, READ BACK BY AND VERIFIED WITH M.GROSE,RN 1254 01/30/23 CLARK,S   Chloride 118 (H) 98 - 111 mmol/L   CO2 15 (L) 22 - 32 mmol/L   Glucose, Bld 88 70 - 99 mg/dL    Comment: Glucose reference range applies only to samples taken after fasting for at least 8 hours.   BUN 12 8 - 23 mg/dL   Creatinine, Ser 1.32 0.61 - 1.24 mg/dL   Calcium 6.1 (LL) 8.9 - 10.3 mg/dL    Comment: CRITICAL RESULT CALLED TO, READ BACK BY AND VERIFIED WITH M.GROSE,RN 1254 01/30/23 CLARK,S   GFR, Estimated >60 >60 mL/min    Comment: (NOTE) Calculated using the CKD-EPI Creatinine Equation (2021)    Anion gap 7 5 - 15    Comment: Performed at [facility], [address] [place], [place] [address]  Troponin I (High Sensitivity)     Status: None   Collection Time: 01/30/23  3:08 PM  Result Value Ref Range   Troponin I (High Sensitivity) 4 <18 ng/L    Comment: (NOTE) Elevated high sensitivity troponin I (hsTnI) values and significant  changes across serial measurements may suggest ACS but many other  chronic and acute conditions are known to elevate hsTnI results.  Refer to the "Links" section for chest pain algorithms and additional  guidance. Performed at [facility], [address], [place], [place] [address]    DG Chest Portable 1 View  Result Date: 01/30/2023 CLINICAL DATA:  Chest pain. EXAM: PORTABLE CHEST 1 VIEW COMPARISON:  Chest x-ray dated January 06, 2023. FINDINGS: The heart size and mediastinal contours are within normal limits. Both lungs are clear. The visualized skeletal  structures are unremarkable. IMPRESSION: No active disease. Electronically Signed   By: Obie Dredge M.D.   On: 01/30/2023 13:35    Pending Labs Unresulted Labs (From admission, onward)     Start     Ordered   01/31/23 0000  Heparin level (unfractionated)  Once-Timed,   TIMED        01/30/23 1723   01/30/23 1713  Hepatic function panel  ONCE - URGENT,   URGENT        01/30/23 1712   01/30/23 1713  Basic metabolic panel  ONCE - URGENT,   URGENT        01/30/23 1712   Signed and Held  Lipoprotein A (LPA)  Tomorrow morning,   R        Signed and Held   Signed and Held  Magnesium  Once,   R        Signed and Held   Signed and Held  TSH  Once,   R        Signed and Held   Signed and Held  Basic metabolic panel  Tomorrow morning,   R        Signed and Held   Signed and Held  Lipid panel  Tomorrow morning,   R        Signed and Held   Signed and Held  CBC  Tomorrow morning,   R        Signed and Held            Vitals/Pain Today's Vitals   01/30/23 1611 01/30/23 1630 01/30/23 1700 01/30/23 1713  BP:  115/62    Pulse:  (!) 54    Resp:  19    Temp: 98.2 F (36.8 C)     TempSrc: Oral     SpO2:  96%    Weight:   134 kg 131.5 kg  Height:   6\' 1"  (1.854 m)   PainSc:        Isolation Precautions No active isolations  Medications Medications  aspirin tablet 325 mg (325 mg Oral Given 01/30/23 1336)  clopidogrel (PLAVIX) tablet 75 mg (has no administration in time range)  heparin ADULT infusion 100 units/mL (25000 units/2108mL) (1,300 Units/hr Intravenous New Bag/Given 01/30/23 1802)  potassium chloride SA (KLOR-CON M) CR tablet 40 mEq (40 mEq Oral Given 01/30/23 1336)  calcium gluconate 1 g/ 50 mL sodium chloride IVPB (0 mg Intravenous Stopped 01/30/23 1432)  heparin bolus via infusion 4,000 Units (4,000 Units Intravenous Bolus from Bag 01/30/23 1803)    Mobility walks     Focused Assessments Cardiac Assessment Handoff:  Cardiac Rhythm: Normal sinus rhythm Lab Results   Component Value Date   CKTOTAL 121 12/24/2013   CKMB 4.9 (H) 11/04/2010   TROPONINI <0.03 10/30/2018   Lab Results  Component Value Date   DDIMER 3.04 (H) 03/28/2017     R Recommendations: See Admitting Provider Note  Report given to:   Additional Notes:

## 2023-01-30 NOTE — ED Notes (Signed)
Recent blood pressure noted to 76/31; repeat pressure 104/39.  This RN changed cuff size and repeated pressure again.  Third reading 115/69; which pt and wife reports is more baseline for him.  Pt denies dizziness or changes in current chest pain.

## 2023-01-30 NOTE — ED Notes (Signed)
Pt eating dinner tray. Will attempt to get temp after pt has finished eating.

## 2023-01-30 NOTE — Telephone Encounter (Signed)
Left voicemail to return call to office.

## 2023-01-30 NOTE — Progress Notes (Deleted)
Office Visit    Patient Name: Craig Copeland Date of Encounter: 01/30/2023  Primary Care Provider:  [name], MD Primary Cardiologist:  Thurmon Fair, MD  Chief Complaint  67 year old male past medical history of coronary artery disease, hypertension and hyperlipidemia.  Presents for follow-up today for coronary artery disease. Past Medical History    Past Medical History:  Diagnosis Date   CAD (coronary artery disease)    Dizziness    Dyslipidemia    Flu 06/2016   Had again 01/208 with PNA   Obesity    OSA (obstructive sleep apnea)    Systemic hypertension    Vasospastic angina [facility])    Past Surgical History:  Procedure Laterality Date   CARDIAC CATHETERIZATION  12/02/2002   <20% restenosis RCA   CARDIAC CATHETERIZATION  01/30/2010   20-30% in-stent restenosis,60-70% ostial stenosis   CORONARY ANGIOPLASTY WITH STENT PLACEMENT  11/13/2002   mid RCA   KNEE SURGERY  2003   LEFT HEART CATH AND CORONARY ANGIOGRAPHY N/A 09/05/2016   Procedure: Left Heart Cath and Coronary Angiography;  Surgeon: Kathleene Hazel, MD;  Location: [facility] INVASIVE CV LAB;  Service: Cardiovascular;  Laterality: N/A;   LEFT HEART CATHETERIZATION WITH CORONARY ANGIOGRAM N/A 12/22/2012   Procedure: LEFT HEART CATHETERIZATION WITH CORONARY ANGIOGRAM;  Surgeon: Thurmon Fair, MD;  Location: [facility] CATH LAB;  Service: Cardiovascular;  Laterality: N/A;   NOSE SURGERY     TOTAL KNEE ARTHROPLASTY Left 03/22/2017   Procedure: LEFT TOTAL KNEE ARTHROPLASTY;  Surgeon: Eugenia Mcalpine, MD;  Location: [facility] ORS;  Service: Orthopedics;  Laterality: Left;   VASECTOMY     Allergies Allergies  Allergen Reactions   Augmentin [Amoxicillin-Pot Clavulanate] Other (See Comments)    Pt unsure of Allergy    Labs/Other Studies Reviewed    The following studies were reviewed today: Cardiac Studies & Procedures   CARDIAC CATHETERIZATION  CARDIAC CATHETERIZATION 09/05/2016  Narrative  Prox RCA to Dist RCA lesion, 20  %stenosed.  Ost Ramus lesion, 70 %stenosed.  Ost 1st Diag to 1st Diag lesion, 20 %stenosed.  Mid LAD to Dist LAD lesion, 20 %stenosed.  Prox LAD lesion, 20 %stenosed.  The left ventricular ejection fraction is 50-55% by visual estimate.  The left ventricular systolic function is normal.  LV end diastolic pressure is normal.  There is no mitral valve regurgitation.  1. Single vessel CAD with patent stents proximal/mid RCA with minimal stent restenosis 2. Mild non-obstructive disease in the LAD with segment of intramyocardial bridging in the mid to distal LAD 3. Small caliber intermediate branch with moderately severe ostial stenosis, unchanged from cath in 2014. 4. Normal LV systolic function  Recommendations: Continue medical management of CAD.  Findings Coronary Findings Diagnostic  Dominance: Right  Left Anterior Descending Vessel is large.  Segment of intramyocardial bridging.  First Diagonal Branch Vessel is moderate in size.  First Septal Branch Vessel is small in size.  Second Diagonal Branch Vessel is small in size.  Second Septal Branch Vessel is small in size.  Third Diagonal Branch Vessel is small in size.  Third Septal Branch Vessel is small in size.  Ramus Intermedius Vessel is small.  Left Circumflex Vessel is large.  Second Obtuse Marginal Branch Vessel is large in size.  Third Obtuse Marginal Branch Vessel is small in size.  Right Coronary Artery The lesion was previously treated using a drug eluting stent over 2 years ago. Previously placed stent displays restenosis.  Intervention  No interventions have been documented.  STRESS TESTS  MYOCARDIAL PERFUSION IMAGING 04/14/2015  Narrative  The left ventricular ejection fraction is normal (55-65%).  Nuclear stress EF: 60%.  There was no ST segment deviation noted during stress.  Defect 1: There is a small defect of mild severity present in the basal inferior and mid  inferior location. This is likely due to diaphragmatic attenuation, but cannot rule out infarct with mild peri-infarct ischemia.  The study is normal.  This is a low risk study.   ECHOCARDIOGRAM  ECHOCARDIOGRAM COMPLETE 04/13/2015  Narrative *[facility]* [address] [place], [place] [address] [phone]  ------------------------------------------------------------------- Transthoracic Echocardiography  Patient:    Trieu, Korey MR #:       [id number] Study Date: 04/13/2015 Gender:     M Age:        58 Height:     185.4 cm Weight:     138.3 kg BSA:        2.73 m^2 Pt. Status: Room:  ORDERING     Thurmon Fair, MD REFERRING    Thurmon Fair, MD SONOGRAPHER  Clearence Ped, RCS PERFORMING   [facility], Outpatient ATTENDING    Nahser, Jr  cc:  ------------------------------------------------------------------- LV EF: 55% -   60%  ------------------------------------------------------------------- Indications:      785.2 Cardiac murmur (R01.1).  ------------------------------------------------------------------- Study Conclusions  - Left ventricle: The cavity size was normal. Wall thickness was increased in a pattern of mild LVH. Systolic function was normal. The estimated ejection fraction was in the range of 55% to 60%. Wall motion was normal; there were no regional wall motion abnormalities. Left ventricular diastolic function parameters were normal. - Aortic valve: Mildly calcified annulus. Trileaflet. - Mitral valve: There was trivial regurgitation. - Left atrium: The atrium was at the upper limits of normal in size. - Right atrium: Central venous pressure (est): 3 mm Hg. - Pulmonary arteries: Systolic pressure could not be accurately estimated. - Pericardium, extracardiac: There was no pericardial effusion.  Impressions:  - Mild LVH with LVEF 55-60% and grossly normal diastolic function. Upper normal left atrial chamber size. Aortic valve  is mildly sclerotic.  Transthoracic echocardiography.  M-mode, complete 2D, spectral Doppler, and color Doppler.  Birthdate:  Patient birthdate: 1956/01/01.  Age:  Patient is 67 yr old.  Sex:  Gender: male. BMI: 40.2 kg/m^2.  Blood pressure:     160/67  Patient status: Outpatient.  Study date:  Study date: 04/13/2015. Study time: 11:22 AM.  Location:  [facility]  -------------------------------------------------------------------  ------------------------------------------------------------------- Left ventricle:  The cavity size was normal. Wall thickness was increased in a pattern of mild LVH. Systolic function was normal. The estimated ejection fraction was in the range of 55% to 60%. Wall motion was normal; there were no regional wall motion abnormalities. Left ventricular diastolic function parameters were normal.  ------------------------------------------------------------------- Aortic valve:   Mildly calcified annulus. Trileaflet. Cusp separation was normal.  Doppler:  There was no significant regurgitation.  ------------------------------------------------------------------- Aorta:  Aortic root: The aortic root was normal in size.  ------------------------------------------------------------------- Mitral valve:   The valve appears to be grossly normal.    Doppler: There was trivial regurgitation.    Peak gradient (D): 3 mm Hg.  ------------------------------------------------------------------- Left atrium:  The atrium was at the upper limits of normal in size.  ------------------------------------------------------------------- Right ventricle:  The cavity size was normal. Systolic pressure was within the normal range.  ------------------------------------------------------------------- Pulmonic valve:    The valve appears to be grossly normal. Doppler:  There was physiologic  regurgitation.  ------------------------------------------------------------------- Tricuspid valve:   The valve appears to be grossly normal. Doppler:  There was trivial regurgitation.  ------------------------------------------------------------------- Pulmonary artery:    Systolic pressure could not be accurately estimated.  ------------------------------------------------------------------- Right atrium:  The atrium was normal in size.  ------------------------------------------------------------------- Pericardium:  There was no pericardial effusion.  ------------------------------------------------------------------- Systemic veins: Inferior vena cava: The vessel was normal in size. The respirophasic diameter changes were in the normal range (= 50%), consistent with normal central venous pressure. Diameter: 15 mm.  ------------------------------------------------------------------- Measurements  IVC                                         Value        Reference ID                                          15    mm     ---------  Left ventricle                              Value        Reference LV ID, ED, PLAX chordal                     45.4  mm     43 - 52 LV ID, ES, PLAX chordal                     31    mm     23 - 38 LV fx shortening, PLAX chordal              32    %      >=29 LV PW thickness, ED                         12.9  mm     --------- IVS/LV PW ratio, ED                         0.91         <=1.3 Stroke volume, 2D                           97    ml     --------- Stroke volume/bsa, 2D                       36    ml/m^2 --------- LV e&', lateral                              8.88  cm/s   --------- LV E/e&', lateral                            9.67         --------- LV e&', medial                               10.7  cm/s   --------- LV E/e&', medial  8.03         --------- LV e&', average                              9.79  cm/s    --------- LV E/e&', average                            8.77         ---------  Ventricular septum                          Value        Reference IVS thickness, ED                           11.7  mm     ---------  LVOT                                        Value        Reference LVOT ID, S                                  23    mm     --------- LVOT area                                   4.15  cm^2   --------- LVOT peak velocity, S                       112   cm/s   --------- LVOT mean velocity, S                       76.8  cm/s   --------- LVOT VTI, S                                 23.4  cm     --------- LVOT peak gradient, S                       5     mm Hg  ---------  Aorta                                       Value        Reference Aortic root ID, ED                          33    mm     ---------  Left atrium                                 Value        Reference LA ID, A-P, ES  41    mm     --------- LA ID/bsa, A-P                              1.5   cm/m^2 <=2.2 LA volume, S                                69    ml     --------- LA volume/bsa, S                            25.3  ml/m^2 --------- LA volume, ES, 1-p A4C                      67    ml     --------- LA volume/bsa, ES, 1-p A4C                  24.6  ml/m^2 --------- LA volume, ES, 1-p A2C                      69    ml     --------- LA volume/bsa, ES, 1-p A2C                  25.3  ml/m^2 ---------  Mitral valve                                Value        Reference Mitral E-wave peak velocity                 85.9  cm/s   --------- Mitral A-wave peak velocity                 77    cm/s   --------- Mitral deceleration time                    173   ms     150 - 230 Mitral peak gradient, D                     3     mm Hg  --------- Mitral E/A ratio, peak                      1.12         ---------  Tricuspid valve                             Value        Reference Tricuspid regurg peak  velocity              113   cm/s   --------- Tricuspid peak RV-RA gradient               5     mm Hg  --------- Tricuspid maximal regurg velocity,          113   cm/s   --------- PISA  Systemic veins                              Value  Reference Estimated CVP                               3     mm Hg  ---------  Right ventricle                             Value        Reference RV pressure, S, DP                          8     mm Hg  <=30 RV s&', lateral, S                           12.4  cm/s   ---------  Legend: (L)  and  (H)  mark values outside specified reference range.  ------------------------------------------------------------------- Prepared and Electronically Authenticated by  Nona Dell, M.D. 2016-10-12T15:55:34            Recent Labs: 01/06/2023: BUN 20; Creatinine, Ser 0.98; Hemoglobin 14.2; Platelets 226; Potassium 4.0; Sodium 136  Recent Lipid Panel    Component Value Date/Time   CHOL 137 04/08/2015 0804   TRIG 157 (H) 04/08/2015 0804   HDL 32 (L) 04/08/2015 0804   CHOLHDL 4.3 04/08/2015 0804   VLDL 31 (H) 04/08/2015 0804   LDLCALC 74 04/08/2015 0804   History of Present Illness  67 year old male with past medical history of coronary artery disease, vasospastic angina, hypertension, OSA and hyperlipidemia.  He has a history of previous stent to the RCA in 2004 with a DES overlapping another DES in the mid to proximal RCA, and a moderate to severe ostial lesion of a small to medium ramus intermedius artery that is too small for PCI. He had no changes on LHC in 2018.   He was last seen by Dr. Royann Shivers on 08/08/2022 for follow-up.  He denied any chest discomfort requiring the use of nitroglycerin in the last year.  Reported that if he misses a dose of medication he would develop some mild chest tightness and increased fatigue.   12/28/2022 he presented to [facility] for dizziness and chest tightness.  His EKG was negative for ischemic changes, cardiac markers  were negative.  Per patient report he had been installing a hood over a fireplace and after going up and down the ladder a few times developed dizziness headache and visual disturbances and chest tightness.  He went home, rested and the next day returned to his work with repeated symptoms, as such he presented to the emergency room.  Reported that he had dizziness, headache, chest discomfort and headache, that was somewhat relieved by sublingual nitroglycerin. While inpatient he underwent nuclear stress test that was positive for inducible ischemia.  On 7/1 he underwent left heart cath that revealed a lesion in the RCA that could not be crossed, the angiogram showed a focal area of 99-100% luminal narrowing with TIMI I flow in the mid RCA. Repeat LHC on 7/2 they were unable to cross the lesion again and it was determined that he should undergo medical management.  His echo during admission indicated an EF of 60 to 65%, wall motion was normal there was mild asymmetric hypertrophy LV. He was started on metoprolol tartrate 25 mg twice daily and Ranexa 1000 mg twice daily.  His Imdur was decreased to  30 mg daily.  Of note he was previously on a beta-blocker and was unable to tolerate in setting of bradycardia.  Per [facility] procedure notes: Unsuccessful PCI of mid right coronary artery.  The procedure was aborted on account of inability to advance the interventional wire more than 20 to 30 mm distal to the lesion, and the inability to advance a 1.5 mm diameter balloon and low-profile exchange catheter across the lesion.  Failure may be due to severe calcification and rigidity of the vessel versus inadvertent passing of the wire behind stent struts due to insufficient overlap of sequential stents in the proximal and mid RC.  7/1: Left Heart Catheterization Summary: The angiogram showed a focal area of 99-100% luminal narrowing with TIMI I flow in the mid RCA. The occlusion appears to be due to the edge  restenosis of 2 previously placed stents which do not appear to overlap. Attempts to advance a wire across the lesion were difficult but successful. However, attempts to complete the PCI were unsuccessful due to to a combination of heavy vessel calcification, tortuosity, and poor radial guide support. In addition, it is likely that the wire passed underneath a stent strut.  Calcification without luminal narrowing or stenosis of the left coronary artery. Left ventricular end-diastolic pressure 25 mmHg.  7/2: Repeat LHC with attempt to cross lesion in RCA. Attempt unsuccessful so the plan is to pursue aggressive medical management   He was seen in the ED on 01/06/2023 for right groin pain following left heart catheterization.  CT scan was without pseudoaneurysm, hematoma or hemorrhage.  Patient reported improvement in discomfort while in the ED and was discharged.  On 01/09/23 at OV he reported ongoing chest pressure that he notes is a constant 1 out of 10 and will increase with exertion.  He also endorsed fatigue, and dizziness.  He noted very slight improvement compared to 2 days prior to left heart cath.    Home Medications    Current Outpatient Medications  Medication Sig Dispense Refill   amLODipine (NORVASC) 5 MG tablet TAKE 1 TABLET BY MOUTH EVERY DAY IN THE EVENING (Patient taking differently: Take 2.5 mg by mouth every evening.) 90 tablet 3   aspirin EC 81 MG tablet Take 81 mg by mouth daily.     cephALEXin (KEFLEX) 500 MG capsule Take 1 capsule (500 mg total) by mouth 2 (two) times daily. (Patient not taking: Reported on 01/28/2023) 10 capsule 0   cholecalciferol (VITAMIN D) 1000 units tablet Take 1,000 Units by mouth daily.     clopidogrel (PLAVIX) 75 MG tablet Take 75 mg by mouth daily.     ezetimibe (ZETIA) 10 MG tablet Take 10 mg by mouth every evening.      fish oil-omega-3 fatty acids 1000 MG capsule Take 1 g by mouth daily.      hydrochlorothiazide (MICROZIDE) 12.5 MG capsule Take  12.5 mg by mouth every morning.     isosorbide mononitrate (IMDUR) 30 MG 24 hr tablet Take 1.5 tablets (45 mg total) by mouth every morning AND 1 tablet (30 mg total) every evening. 37.5 tablet 3   losartan (COZAAR) 100 MG tablet Take 0.5 tablets (50 mg total) by mouth daily. 90 tablet 3   metoprolol tartrate (LOPRESSOR) 25 MG tablet Take 25 mg by mouth 2 (two) times daily.     nitroGLYCERIN (NITROSTAT) 0.4 MG SL tablet PLACE 1 TABLET UNDER THE TONGUE EVERY 5 MINUTES AS NEEDED FOR CHEST PAIN. 25 tablet 2   pravastatin (  PRAVACHOL) 80 MG tablet Take 80 mg by mouth at bedtime.      ranolazine (RANEXA) 1000 MG SR tablet Take 1,000 mg by mouth 2 (two) times daily.     Current Facility-Administered Medications  Medication Dose Route Frequency Provider Last Rate Last Admin   triamcinolone acetonide (KENALOG-40) injection 40 mg  40 mg Intramuscular Once Monica Becton, MD        Review of Systems   He denies palpitations, pnd, orthopnea, n, v, syncope, edema, weight gain, or early satiety. All other systems reviewed and are otherwise negative except as noted above.  All other systems reviewed and are otherwise negative except as noted above.   Physical Exam    VS:  There were no vitals taken for this visit. , BMI There is no height or weight on file to calculate BMI.     GEN: Well nourished, well developed, in no acute distress. HEENT: normal. Neck: Supple, no JVD, carotid bruits, or masses. Cardiac: RRR, no murmurs, rubs, or gallops. No clubbing, cyanosis, edema.  Radials/DP/PT 2+ and equal bilaterally.  Respiratory:  Respirations regular and unlabored, clear to auscultation bilaterally. GI: Soft, nontender, nondistended, BS + x 4. MS: no deformity or atrophy. Skin: warm and dry, no rash. Neuro:  Strength and sensation are intact. Psych: Normal affect.  Accessory Clinical Findings    ECG personally reviewed by me today -    - no acute changes.   Lab Results  Component Value Date    WBC 7.2 01/06/2023   HGB 14.2 01/06/2023   HCT 42.9 01/06/2023   MCV 90.7 01/06/2023   PLT 226 01/06/2023   Lab Results  Component Value Date   CREATININE 0.98 01/06/2023   BUN 20 01/06/2023   NA 136 01/06/2023   K 4.0 01/06/2023   CL 100 01/06/2023   CO2 22 01/06/2023   Lab Results  Component Value Date   ALT 44 12/18/2012   AST 27 12/18/2012   ALKPHOS 51 12/18/2012   BILITOT 0.8 12/18/2012   Lab Results  Component Value Date   CHOL 137 04/08/2015   HDL 32 (L) 04/08/2015   LDLCALC 74 04/08/2015   TRIG 157 (H) 04/08/2015   CHOLHDL 4.3 04/08/2015    Lab Results  Component Value Date   HGBA1C 5.6 12/24/2013   Assessment & Plan   CAD s/p PCI RCA: Left heart cath at [facility] on 7/1 and 7/2 indicated a focal area of 99-100% luminal narrowing with TIMI I flow in the mid RCA. The occlusion appeared to be due to the edge restenosis of 2 previously placed stents which do not appear to overlap.   It was recommended that he undergo medical management. He was seen in office on 01/09/23 reporting ongoing symptoms, his Imdur was increased to 45mg  in the morning and 30mg  at night. Chart was forwarded to Dr. Royann Shivers and Dr. [place] for review. Dr. [place] called patient and discussed repeat cardiac catheterization with possible intervention, patient is agreeable to cardiac catheterization, see consent below.  Today he reports ongoing chest discomfort that worsens with exertion associated with increased shortness of breath, slightly improved with nitroglycerin or rest. Reviewed ED precautions.   Continue amlodipine, aspirin, Plavix, Zetia, metoprolol tartrate, Ranexa, hydrochlorothiazide, Imdur, losartan and pravastatin.  HLD: Profile taken by [facility] on 12/31/2022 indicated total cholesterol of 115 and LDL of 62. Continue Zetia and pravastatin. Refer to lipid clinic for consideration of PCSK9-I.   HTN: Blood pressure today ***. Continue amlodipine, metoprolol tartrate, Ranexa,  hydrochlorothiazide, Imdur and losartan.  Aortic sclerosis/AS: Echo from 12/31/2022 indicated a tricuspid aortic valve, leaflets were mildly calcified with no evidence of regurgitation or stenosis.  Disposition:  No BP recorded.  {Refresh Note OR Click here to enter BP  :1}***  Rip Harbour, NP 01/30/2023, 7:43 AM

## 2023-01-30 NOTE — Progress Notes (Signed)
ANTICOAGULATION CONSULT NOTE - Initial Consult  Pharmacy Consult for heparin infusion Indication: chest pain/ACS  Allergies  Allergen Reactions   Augmentin [Amoxicillin-Pot Clavulanate] Other (See Comments)    Pt unsure of Allergy     Patient Measurements: Height: 6\' 1"  (185.4 cm) Weight: 131.5 kg (290 lb) IBW/kg (Calculated) : 79.9 Heparin Dosing Weight: 110 kg  Vital Signs: Temp: 98.2 F (36.8 C) (07/31 1611) Temp Source: Oral (07/31 1611) BP: 115/62 (07/31 1630) Pulse Rate: 54 (07/31 1630)  Labs: Recent Labs    01/30/23 1207 01/30/23 1508  HGB 11.6*  --   HCT 39.1  --   PLT 131*  --   CREATININE 0.69  --   TROPONINIHS  --  4    Estimated Creatinine Clearance: 132.6 mL/min (by C-G formula based on SCr of 0.69 mg/dL).   Medical History: Past Medical History:  Diagnosis Date   CAD (coronary artery disease)    Dizziness    Dyslipidemia    Flu 06/2016   Had again 01/208 with PNA   Obesity    OSA (obstructive sleep apnea)    Systemic hypertension    Vasospastic angina [facility])     Assessment: 66yoM w/ known history of CAD, PCI w/ DES in 2004, and recent unsuccessful PCI 01/01/23. Presenting with chief complaint of chest pain with plan for PCI 01/31/23. Prior to admission was on DAPT with ASA and clopidogrel, no anticoagulation. Pharmacy is consulted to dose heparin.  Goal of Therapy:  Heparin level 0.3-0.7 units/ml Monitor platelets by anticoagulation protocol: Yes   Plan:  Administer heparin bolus via infusion of 4000 units Start heparin infusion at 1300 units/hr Follow up heparin level at 0000 on 01/31/23  Rutherford Nail, PharmD PGY2 Critical Care Pharmacy Resident 01/30/2023,5:23 PM

## 2023-01-30 NOTE — H&P (Addendum)
Cardiology Admission History and Physical   Patient ID: Craig Copeland MRN: [id number]; DOB: [birth year]   Admission date: 01/30/2023  PCP:  Craig Brunette, MD   [facility] Providers Cardiologist:  Craig Fair, MD   Chief Complaint:  chest pain, CAD  Patient Profile:   Craig Copeland is a 67 y.o. male with CAD, HTN, HLD, obesity, and OSA not currently on CPAP who is being seen 01/30/2023 for the evaluation of chest pain.  History of Present Illness:   Craig Copeland has a history of CAD with PCI-mRCA 2004 (overlapping DES), moderate to severe ostial lesion of a small to medium ramus too small for PCI. Heart cath in 2018 with unchanged anatomy. He was seen 12/28/22 at [facility] for chest pain and dizziness that occurred with exertion. He ruled out with negative enzymes and nonischemic EKG. However, nuclear stress test showed inducible ischemia. He proceeded to heart catheterization 12/31/22 that showed a lesion in th RCA that could not be crossed, 99-100% in stent restenosis with TIMI-1 flow.  PCI was attempted on 01/01/23 but they were unable to cross the lesion again and recommended medical therapy. Echocardiogram showed a preserved EF 60-65%, no RWMA, mild LAE, no significant valvular disease. He was subsequently seen in the ER for groin pain, CT negative for PSA. He was discharged without admission.    He was seen by Craig Littler NP 01/09/23 and reported ongoing 1/10 chest pressure. He has been having ongoing CP intermittently for a few months, somewhat improved by increasing imdur. She further increased imdur to 45 mg qAM and 30 mg qPM.  He was also started on ranexa. He continued ASA and plavix.  Previously did not tolerate BB due to bradycardia, but has done well with lopressor 25 mg BID for the past week.  Films were obtained and reviewed by Craig Copeland and subsequently scheduled for CTO/coronary atherectomy 01/31/23.   Unfortunately, he presented to [facility] 01/30/23 for chest pain. HR  67, BP 119/75, O2 99% on room air.  Hb 11.6, PLT 131, WBC 5.1 sCr 0.69, CO2 15, Ca 6.1, K 2.8 HS troponin 4, delta pending EKG with inferior Q waves (old)   He was given 325 mg ASA, calcium gluconate, and PO K. Cardiology asked to evaluate for admission.   He recounted dizziness, blurry vision, and chest pressure that prompted his [facility] admission. This morning, he describes walking to another room in his home and developing 9/10 chest pressure that felt like an elephant on his chest. No associated symptoms.  He took 4 NTG tablets over the course of 30 min with minimal relief. Of note, NTG did not tingle under his tongue and he did not experience hypotension, question efficacy. Here, he has not received additional NTG, CP is 3/10. EKG with inferior Q waves (old).   Of  note, HS troponin was negative during his [facility] admission.   He quit wearing CPAP greater than 5 years ago. [facility] recommended for repeat sleep study to reinstate CPAP - awaiting sleep study.    Past Medical History:  Diagnosis Date   CAD (coronary artery disease)    Dizziness    Dyslipidemia    Flu 06/2016   Had again 01/208 with PNA   Obesity    OSA (obstructive sleep apnea)    Systemic hypertension    Vasospastic angina [facility])     Past Surgical History:  Procedure Laterality Date   CARDIAC CATHETERIZATION  12/02/2002   <20% restenosis RCA   CARDIAC  CATHETERIZATION  01/30/2010   20-30% in-stent restenosis,60-70% ostial stenosis   CORONARY ANGIOPLASTY WITH STENT PLACEMENT  11/13/2002   mid RCA   KNEE SURGERY  2003   LEFT HEART CATH AND CORONARY ANGIOGRAPHY N/A 09/05/2016   Procedure: Left Heart Cath and Coronary Angiography;  Surgeon: Craig Hazel, MD;  Location: [facility] INVASIVE CV LAB;  Service: Cardiovascular;  Laterality: N/A;   LEFT HEART CATHETERIZATION WITH CORONARY ANGIOGRAM N/A 12/22/2012   Procedure: LEFT HEART CATHETERIZATION WITH CORONARY ANGIOGRAM;  Surgeon: Craig Fair, MD;  Location: [facility] CATH  LAB;  Service: Cardiovascular;  Laterality: N/A;   NOSE SURGERY     TOTAL KNEE ARTHROPLASTY Left 03/22/2017   Procedure: LEFT TOTAL KNEE ARTHROPLASTY;  Surgeon: Craig Mcalpine, MD;  Location: [facility] ORS;  Service: Orthopedics;  Laterality: Left;   VASECTOMY       Medications Prior to Admission: Prior to Admission medications   Medication Sig Start Date End Date Taking? Authorizing Provider  amLODipine (NORVASC) 5 MG tablet TAKE 1 TABLET BY MOUTH EVERY DAY IN THE EVENING Patient taking differently: Take 2.5 mg by mouth every evening. 09/11/22   Copeland, Mihai, MD  aspirin EC 81 MG tablet Take 81 mg by mouth daily.    [provider]  cephALEXin (KEFLEX) 500 MG capsule Take 1 capsule (500 mg total) by mouth 2 (two) times daily. Patient not taking: Reported on 01/28/2023 01/06/23   Craig Ade, MD  cholecalciferol (VITAMIN D) 1000 units tablet Take 1,000 Units by mouth daily.    [provider]  clopidogrel (PLAVIX) 75 MG tablet Take 75 mg by mouth daily.    [provider]  ezetimibe (ZETIA) 10 MG tablet Take 10 mg by mouth every evening.     [provider]  fish oil-omega-3 fatty acids 1000 MG capsule Take 1 g by mouth daily.     [provider]  hydrochlorothiazide (MICROZIDE) 12.5 MG capsule Take 12.5 mg by mouth every morning.    [provider]  isosorbide mononitrate (IMDUR) 30 MG 24 hr tablet Take 1.5 tablets (45 mg total) by mouth every morning AND 1 tablet (30 mg total) every evening. 01/09/23   Craig Copeland D, NP  losartan (COZAAR) 100 MG tablet Take 0.5 tablets (50 mg total) by mouth daily. 12/22/12   Copeland, Mihai, MD  metoprolol tartrate (LOPRESSOR) 25 MG tablet Take 25 mg by mouth 2 (two) times daily. 01/01/23   [provider]  nitroGLYCERIN (NITROSTAT) 0.4 MG SL tablet PLACE 1 TABLET UNDER THE TONGUE EVERY 5 MINUTES AS NEEDED FOR CHEST PAIN. 05/01/22   Copeland, Mihai, MD  pravastatin (PRAVACHOL) 80 MG tablet Take 80  mg by mouth at bedtime.     [provider]  ranolazine (RANEXA) 1000 MG SR tablet Take 1,000 mg by mouth 2 (two) times daily. 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   Patient is right handed.   Patient has 12th grade education.   Patient does not drink caffeine.   Social Determinants of Health   Financial Resource Strain: Not on file  Food Insecurity: No Food Insecurity (12/29/2022)   Received from [facility], [facility]   Hunger Vital Sign    Worried About Running Out of Food in the Last Year: Never true    Ran Out of Food in the Last Year: Never true  Transportation Needs: No Transportation Needs (12/29/2022)   Received from [facility], [facility]   PRAPARE - Transportation    Lack of Transportation (Medical): No    Lack of Transportation (Non-Medical): No  Physical Activity: Not on file  Stress: No Stress Concern Present (12/28/2022)   Received from [facility], [facility] - Occupational Stress Questionnaire    Feeling of Stress : Not at all  Social  Connections: Unknown (01/02/2023)   Received from [facility], [facility]   Social Network    Social Network: Not on file  Intimate Partner Violence: Not At Risk (12/28/2022)   Received from [facility], [facility]   HITS    Over the last 12 months how often did your partner physically hurt you?: 1    Over the last 12 months how often did your partner insult you or talk down to you?: 1    Over the last 12 months how often did your partner threaten you with physical harm?: 1    Over the last 12 months how often did your partner scream or curse at you?: 1    Family History:   The patient's family history includes Alzheimer's disease in his paternal grandfather; Cancer in his paternal grandfather; Heart failure in his father and maternal grandmother; Hypertension in his father; Stroke in his paternal grandmother.    ROS:  Please see the history of present illness.  All other ROS reviewed and negative.     Physical Exam/Data:   Vitals:   01/30/23 1156 01/30/23 1500 01/30/23 1611  BP: 136/68 (!) 90/42   Pulse: 66 (!) 55   Resp: 16 19   Temp: 97.9 F (36.6 C)  98.2 F (36.8 C)  TempSrc: Oral  Oral  SpO2: 99% 97%    No intake or output data in the 24 hours ending 01/30/23 1628    01/09/2023    2:00 PM 01/06/2023    2:48 PM 12/05/2022    1:33 PM  Last 3 Weights  Weight (lbs) 293 lb 3.2 oz 292 lb 300 lb 6.4 oz  Weight (kg) 132.995 kg 132.45 kg 136.261 kg     There is no height or weight on file to calculate BMI.  General:  Well nourished, well developed, in no acute distress HEENT: normal Neck: no JVD Vascular: No carotid bruits; Distal pulses 2+ bilaterally   Cardiac:  normal S1, S2; RRR; no murmur  Lungs:  clear to auscultation bilaterally, no wheezing, rhonchi or rales  Abd: soft, nontender, no hepatomegaly  Ext: no edema Musculoskeletal:  No deformities, BUE and BLE  strength normal and equal Skin: warm and dry  Neuro:  CNs 2-12 intact, no focal abnormalities  noted Psych:  Normal affect  Right groin without hematoma   EKG:  The ECG that was done was personally reviewed and demonstrates sinus rhythm with HR 58, long PR 108 ms, inferior Q waves  Relevant CV Studies:   Heart cath 01/01/23: Summary:  Unsuccessful PCI of mid right coronary artery.  The procedure was aborted  on account of inability to advance the interventional wire more than 20 to  30 mm distal to the lesion, and the inability to advance a 1.5 mm diameter  balloon and low-profile exchange catheter across the lesion.  Failure may  be due to severe calcification and rigidity of the vessel versus  inadvertent passing of the wire behind stent struts due to insufficient  overlap of sequential stents in the proximal and mid RCA.    Plan:  Pursue aggressive medical therapy aimed at angina prophylaxis.    Heart cath 12/31/22: LEFT VENTRICLE  End-diastolic pressure 25 mmHg.  Angiography not performed.   Summary:  The angiogram showed a focal area of 99-100% luminal narrowing with TIMI I  flow in the mid RCA.  The occlusion appears to be due to the edge  restenosis of 2 previously placed stents which do not appear to overlap.   Attempts to advance a wire across the lesion were difficult but  successful.  However, attempts to complete the PCI were unsuccessful due  to to a combination of heavy vessel calcification, tortuosity, and poor  radial guide support.  In addition, it is likely that the wire passed  underneath a stent strut.   Calcification without luminal narrowing or stenosis of the left coronary  artery.  Left ventricular end-diastolic pressure 25 mmHg.   Plan:  Attempt PCI with right common femoral artery access and larger diameter  guide catheter tomorrow.  Continue telemetry monitoring.  Continue antiplatelet therapy.    Laboratory Data:  High Sensitivity Troponin:   Recent Labs  Lab 01/06/23 1458 01/06/23 1740 01/30/23 1508  TROPONINIHS 8 8 4        Chemistry Recent Labs  Lab 01/30/23 1207  NA 140  K 2.8*  CL 118*  CO2 15*  GLUCOSE 88  BUN 12  CREATININE 0.69  CALCIUM 6.1*  GFRNONAA >60  ANIONGAP 7    No results for input(s): "PROT", "ALBUMIN", "AST", "ALT", "ALKPHOS", "BILITOT" in the last 168 hours. Lipids No results for input(s): "CHOL", "TRIG", "HDL", "LABVLDL", "LDLCALC", "CHOLHDL" in the last 168 hours. Hematology Recent Labs  Lab 01/30/23 1207  WBC 5.1  RBC 3.95*  HGB 11.6*  HCT 39.1  MCV 99.0  MCH 29.4  MCHC 29.7*  RDW 13.7  PLT 131*   Thyroid No results for input(s): "TSH", "FREET4" in the last 168 hours. BNPNo results for input(s): "BNP", "PROBNP" in the last 168 hours.  DDimer No results for input(s): "DDIMER" in the last 168 hours.   Radiology/Studies:  DG Chest Portable 1 View  Result Date: 01/30/2023 CLINICAL DATA:  Chest pain. EXAM: PORTABLE CHEST 1 VIEW COMPARISON:  Chest x-ray dated January 06, 2023. FINDINGS: The heart size and mediastinal contours are within normal limits. Both lungs are clear. The visualized skeletal structures are unremarkable. IMPRESSION: No active disease. Electronically Signed   By: Obie Dredge M.D.   On: 01/30/2023 13:35     Assessment and Plan:   Chest pain concerning for unstable angina CAD  - hx of DES x  2 RCA 2004 - known 99-100% occlusion in stent restenosis with 2 failed PCI attempts due to inability to cross the lesion, medical therapy was recommended - he continued to have angina, films reviewed and he was scheduled for CTO/atherectomy with Craig Copeland tomorrow - HS troponin negative, delta pending, of note - CE negative at [facility] - given known disease, would start heparin gtt - continue ASA and plavix - NTG gtt if needed, otherwise PRN SL NTG - HR in the 50s on 25 mg lopressor BID (took this morning), asymptomatic - continue ranexa 1000 mg BID, amlodipine 2.5 mg, hold losartan, hydrochlorothiazide, and imdur (if nitroglycerin gtt) - A1c  5.7%   Electrolyte abnormality - K 2.8, Ca 6.1 - has received calcium gluconate and potassium - will give an additional 40 mEq potassium tonight - recheck BMP this afternoon and tomorrow morning   Hyperlipidemia with LDL goal < 70 Lipid panel 07/07/08: Total chol: 115, Trig 84, LDL 62, HDL 37 Consider collecting LP(a) - continue zetia and pravachol 80 mg   Hypertension PTA: 5 mg amlodipine, 12.5 mg hydrochlorothiazide, lopressor 25mg  BID, imdur 45/30 mg, losartan 50 mg - hold hydrochlorothiazide, losartan, and imdur - has already taken lopressor and 2.5 mg amlodipine this morning - BP marginal on my recheck   Obesity OSA - consult respiratory in the event he needs CPAP    Risk Assessment/Risk Scores:   TIMI Risk Score for Unstable Angina or Non-ST Elevation MI:   The patient's TIMI risk score is 4, which indicates a 20% risk of all cause mortality, new or recurrent myocardial infarction or need for urgent revascularization in the next 14 days.  Code Status: Full Code  Severity of Illness: The appropriate patient status for this patient is INPATIENT. Inpatient status is judged to be reasonable and necessary in order to provide the required intensity of service to ensure the patient's safety. The patient's presenting symptoms, physical exam findings, and initial radiographic and laboratory data in the context of their chronic comorbidities is felt to place them at high risk for further clinical deterioration. Furthermore, it is not anticipated that the patient will be medically stable for discharge from the hospital within 2 midnights of admission.   * I certify that at the point of admission it is my clinical judgment that the patient will require inpatient hospital care spanning beyond 2 midnights from the point of admission due to high intensity of service, high risk for further deterioration and high frequency of surveillance required.*   For questions or updates,  please contact [facility] Please consult [email or web address] for contact info under     Signed, Marcelino Duster, [place]  01/30/2023 4:28 PM   Patient seen, examined. Available data reviewed. Agree with findings, assessment, and plan as outlined by Bettina Gavia, PA.  The patient is independently interviewed and examined.  He is alert, oriented, in no distress.  HEENT is normal, JVP is normal, lungs are clear bilaterally, heart is regular rate and rhythm with a 2/6 ejection murmur at the right upper sternal border, abdomen is soft, nontender, with no masses, extremities have no edema, skin is warm and dry with no rash.  The patient describes severe substernal chest discomfort this morning, no improvement with sublingual nitroglycerin that he took at home.  EMS was called and during his transport to [facility], his symptoms improved dramatically.  He is currently chest pain-free.  High-sensitivity troponins are negative.  Data reviewed and recent PCI notes are reviewed where  there was an attempt to cross the critical stenosis in the right coronary artery without success.  The patient is scheduled for CTO-PCI tomorrow with Craig Copeland.  We are going to admit him to the hospital, start IV heparin because of his symptoms of unstable angina, and he will remain on the schedule for coronary intervention tomorrow.  Case discussed with Craig Copeland.  Will hold off on IV nitroglycerin unless he has recurrent chest discomfort.  Continue DAPT with aspirin and clopidogrel.  Otherwise as above.  Tonny Bollman, M.D. 01/30/2023 5:03 PM

## 2023-01-30 NOTE — ED Notes (Signed)
Cardiology provider returned call to this RN in regards to BP.  Per cardiology, will continue to monitor BP/MAP at this time.

## 2023-01-30 NOTE — Telephone Encounter (Signed)
error 

## 2023-01-30 NOTE — ED Triage Notes (Signed)
PT BIB EMS for chest pain, had 4 SL nitroglycerin, on July 1st, pt had a heart cath, and had an additional cath on July 2nd, and is supposed to have another cath tomorrow here at [facility] to work on stents in place that are blocked.   119/75 HR 67 O2 99% SR

## 2023-01-30 NOTE — ED Provider Notes (Signed)
Care of patient received from prior provider at 11:59 PM, please see their note for complete H/P and care plan.  Received handoff per ED course.  Clinical Course as of 01/30/23 2359  Wed Jan 30, 2023  1513 BP 125/67 and not 90/42.  Spoke with cardiology PA on-call for possible admission given patient is due for heart catheterization tomorrow with concerning story for his chest pain.  They will see patient. [TY]  1530 Stable. HO from TY. Cards coming to see. Cath scheduled for tomorrow. Typical, concerning, story.  [CC]    Clinical Course User Index [CC] Glyn Ade, MD [TY] Vincente Liberty, MD 01/30/23 [phone]

## 2023-01-30 NOTE — Progress Notes (Signed)
Received s/o from day APP re: pending K, calcium results. Reviewed with nurse earlier to ensure labs drawn. Results pending at this time. Had also added LFTs for albumin correction if needed. As results are still pending will s/o to on call fellow to review upon completion.

## 2023-01-30 NOTE — Telephone Encounter (Signed)
Patient's wife is calling because she had to cancel appt today due to patient being transferred by ambulance to [facility]. She also wanted to relay this message to Dr. Elvis Coil nurse since patient is scheduled for procedure tomorrow 08/01.

## 2023-01-30 NOTE — ED Notes (Signed)
Young, DO notified of Calcium 6.1 and K of 2.8

## 2023-01-31 ENCOUNTER — Other Ambulatory Visit: Payer: Self-pay

## 2023-01-31 ENCOUNTER — Inpatient Hospital Stay (HOSPITAL_COMMUNITY)
Admission: EM | Disposition: A | Discharge status: Home / Self Care | Payer: Self-pay | Source: Home / Self Care | Attending: Cardiovascular Disease

## 2023-01-31 ENCOUNTER — Ambulatory Visit (HOSPITAL_COMMUNITY): Admission: RE | Admit: 2023-01-31 | Payer: 59 | Source: Ambulatory Visit | Admitting: Cardiology

## 2023-01-31 DIAGNOSIS — I2582 Chronic total occlusion of coronary artery: Secondary | ICD-10-CM

## 2023-01-31 DIAGNOSIS — I25110 Atherosclerotic heart disease of native coronary artery with unstable angina pectoris: Secondary | ICD-10-CM | POA: Diagnosis not present

## 2023-01-31 HISTORY — PX: CORONARY ATHERECTOMY: CATH118238

## 2023-01-31 HISTORY — PX: CORONARY CTO INTERVENTION: CATH118236

## 2023-01-31 LAB — GLUCOSE, CAPILLARY: Glucose-Capillary: 188 mg/dL — ABNORMAL HIGH (ref 70–99)

## 2023-01-31 LAB — POCT ACTIVATED CLOTTING TIME
Activated Clotting Time: 281 seconds
Activated Clotting Time: 287 seconds
Activated Clotting Time: 293 seconds
Activated Clotting Time: 317 seconds
Activated Clotting Time: 305 seconds
Activated Clotting Time: 305 seconds
Activated Clotting Time: 317 seconds
Activated Clotting Time: 287 seconds
Activated Clotting Time: 256 seconds
Activated Clotting Time: 232 seconds
Activated Clotting Time: 183 seconds
Activated Clotting Time: 207 seconds

## 2023-01-31 LAB — HEPARIN LEVEL (UNFRACTIONATED)
Heparin Unfractionated: 0.34 IU/mL (ref 0.30–0.70)
Heparin Unfractionated: 0.39 IU/mL (ref 0.30–0.70)

## 2023-01-31 SURGERY — CORONARY CTO INTERVENTION
Anesthesia: LOCAL

## 2023-01-31 MED ORDER — HYDRALAZINE HCL 20 MG/ML IJ SOLN: 10.0000 mg | INTRAMUSCULAR | Status: AC | PRN

## 2023-01-31 MED ORDER — ICOSAPENT ETHYL 1 G PO CAPS
1.0000 g | ORAL_CAPSULE | Freq: Every day | ORAL | Status: DC
  Administered 2023-01-31 – 2023-02-01 (×2): 1 g via ORAL
  Filled 2023-01-31 (×2): qty 1

## 2023-01-31 MED ORDER — HYDROCHLOROTHIAZIDE 12.5 MG PO TABS
12.5000 mg | ORAL_TABLET | Freq: Every morning | ORAL | Status: DC
  Administered 2023-02-01: 12.5 mg via ORAL
  Filled 2023-01-31: qty 1

## 2023-01-31 MED ORDER — MIDAZOLAM HCL 2 MG/2ML IJ SOLN
INTRAMUSCULAR | Status: DC | PRN
  Administered 2023-01-31: 1 mg via INTRAVENOUS
  Administered 2023-01-31: 2 mg via INTRAVENOUS

## 2023-01-31 MED ORDER — LABETALOL HCL 5 MG/ML IV SOLN: 10.0000 mg | INTRAVENOUS | Status: AC | PRN

## 2023-01-31 MED ORDER — ACETAMINOPHEN 325 MG PO TABS: 650.0000 mg | ORAL_TABLET | ORAL | Status: DC | PRN

## 2023-01-31 MED ORDER — SODIUM CHLORIDE 0.9 % IV SOLN: TOPICAL | Status: DC | PRN

## 2023-01-31 MED ORDER — MIDAZOLAM HCL 2 MG/2ML IJ SOLN
INTRAMUSCULAR | Status: AC
  Filled 2023-01-31: qty 2

## 2023-01-31 MED ORDER — VITAMIN D 25 MCG (1000 UNIT) PO TABS
1000.0000 [IU] | ORAL_TABLET | Freq: Every day | ORAL | Status: DC
  Administered 2023-01-31 – 2023-02-01 (×2): 1000 [IU] via ORAL
  Filled 2023-01-31 (×2): qty 1

## 2023-01-31 MED ORDER — SODIUM CHLORIDE 0.9 % IV SOLN
INTRAVENOUS | Status: AC | PRN
  Administered 2023-01-31: 10 mL/h via INTRAVENOUS

## 2023-01-31 MED ORDER — AMLODIPINE BESYLATE 2.5 MG PO TABS
2.5000 mg | ORAL_TABLET | Freq: Every evening | ORAL | Status: DC
  Administered 2023-01-31: 2.5 mg via ORAL
  Filled 2023-01-31: qty 1

## 2023-01-31 MED ORDER — TRIAMCINOLONE ACETONIDE 40 MG/ML IJ SUSP: 40.0000 mg | Freq: Once | INTRAMUSCULAR | Status: DC

## 2023-01-31 MED ORDER — SODIUM CHLORIDE 0.9% FLUSH
3.0000 mL | Freq: Two times a day (BID) | INTRAVENOUS | Status: DC
  Administered 2023-01-31 – 2023-02-01 (×2): 3 mL via INTRAVENOUS

## 2023-01-31 MED ORDER — SODIUM CHLORIDE 0.9 % IV SOLN: INTRAVENOUS | Status: AC

## 2023-01-31 MED ORDER — FENTANYL CITRATE (PF) 100 MCG/2ML IJ SOLN
INTRAMUSCULAR | Status: DC | PRN
  Administered 2023-01-31 (×2): 25 ug via INTRAVENOUS

## 2023-01-31 MED ORDER — HEPARIN SODIUM (PORCINE) 1000 UNIT/ML IJ SOLN
INTRAMUSCULAR | Status: AC
  Filled 2023-01-31: qty 20

## 2023-01-31 MED ORDER — ATROPINE SULFATE 1 MG/10ML IJ SOSY
PREFILLED_SYRINGE | INTRAMUSCULAR | Status: AC
  Filled 2023-01-31: qty 10

## 2023-01-31 MED ORDER — IOHEXOL 350 MG/ML SOLN
INTRAVENOUS | Status: DC | PRN
  Administered 2023-01-31: 90 mL

## 2023-01-31 MED ORDER — ISOSORBIDE MONONITRATE ER 30 MG PO TB24
30.0000 mg | ORAL_TABLET | Freq: Every evening | ORAL | Status: DC
  Administered 2023-01-31: 30 mg via ORAL
  Filled 2023-01-31: qty 1

## 2023-01-31 MED ORDER — LOSARTAN POTASSIUM 50 MG PO TABS
50.0000 mg | ORAL_TABLET | Freq: Every day | ORAL | Status: DC
  Administered 2023-02-01: 50 mg via ORAL
  Filled 2023-01-31: qty 1

## 2023-01-31 MED ORDER — HEPARIN SODIUM (PORCINE) 1000 UNIT/ML IJ SOLN
INTRAMUSCULAR | Status: AC
  Filled 2023-01-31: qty 10

## 2023-01-31 MED ORDER — SODIUM CHLORIDE 0.9 % IV SOLN: 250.0000 mL | INTRAVENOUS | Status: DC | PRN

## 2023-01-31 MED ORDER — HEPARIN (PORCINE) IN NACL 1000-0.9 UT/500ML-% IV SOLN
INTRAVENOUS | Status: DC | PRN
  Administered 2023-01-31: 1000 mL

## 2023-01-31 MED ORDER — LIDOCAINE HCL (PF) 1 % IJ SOLN
INTRAMUSCULAR | Status: DC | PRN
  Administered 2023-01-31: 15 mL

## 2023-01-31 MED ORDER — LIDOCAINE HCL (PF) 1 % IJ SOLN
INTRAMUSCULAR | Status: AC
  Filled 2023-01-31: qty 30

## 2023-01-31 MED ORDER — HEPARIN SODIUM (PORCINE) 1000 UNIT/ML IJ SOLN
INTRAMUSCULAR | Status: DC | PRN
  Administered 2023-01-31: 10000 [IU] via INTRAVENOUS
  Administered 2023-01-31: 4000 [IU] via INTRAVENOUS
  Administered 2023-01-31: 3000 [IU] via INTRAVENOUS
  Administered 2023-01-31: 1000 [IU] via INTRAVENOUS
  Administered 2023-01-31: 3000 [IU] via INTRAVENOUS

## 2023-01-31 MED ORDER — CLOPIDOGREL BISULFATE 75 MG PO TABS
75.0000 mg | ORAL_TABLET | Freq: Every day | ORAL | Status: DC
  Administered 2023-02-01: 75 mg via ORAL
  Filled 2023-01-31: qty 1

## 2023-01-31 MED ORDER — FENTANYL CITRATE (PF) 100 MCG/2ML IJ SOLN
INTRAMUSCULAR | Status: AC
  Filled 2023-01-31: qty 2

## 2023-01-31 MED ORDER — ISOSORBIDE MONONITRATE ER 30 MG PO TB24
45.0000 mg | ORAL_TABLET | ORAL | Status: DC
  Administered 2023-02-01: 45 mg via ORAL
  Filled 2023-01-31: qty 2

## 2023-01-31 MED ORDER — ASPIRIN 81 MG PO CHEW
81.0000 mg | CHEWABLE_TABLET | Freq: Every day | ORAL | Status: DC
  Administered 2023-02-01: 81 mg via ORAL
  Filled 2023-01-31: qty 1

## 2023-01-31 MED ORDER — ONDANSETRON HCL 4 MG/2ML IJ SOLN: 4.0000 mg | Freq: Four times a day (QID) | INTRAMUSCULAR | Status: DC | PRN

## 2023-01-31 MED ORDER — SODIUM CHLORIDE 0.9% FLUSH: 3.0000 mL | INTRAVENOUS | Status: DC | PRN

## 2023-01-31 SURGICAL SUPPLY — 30 items
BALLN EMERGE MR 2.25X12 (BALLOONS) ×1
BALLN ~~LOC~~ EMERGE MR 3.25X12 (BALLOONS) ×1
BALLOON EMERGE MR 2.25X12 (BALLOONS) IMPLANT
BALLOON ~~LOC~~ EMERGE MR 3.25X12 (BALLOONS) IMPLANT
CATH 7FR TRAPLINER (CATHETERS) IMPLANT
CATH LAUNCHER 6FR JR4 (CATHETERS) IMPLANT
CATH MACH1 8F AL1 90CM (CATHETERS) IMPLANT
CATH MAMBA 135 (CATHETERS) IMPLANT
ELECT DEFIB PAD ADLT CADENCE (PAD) IMPLANT
GUIDEWIRE JUDO 3 190 (WIRE) IMPLANT
GUIDEWIRE ROTADRIVE FLOPPY (WIRE) IMPLANT
KIT ENCORE 26 ADVANTAGE (KITS) IMPLANT
KIT HEART LEFT (KITS) ×1 IMPLANT
KIT HEMO VALVE WATCHDOG (MISCELLANEOUS) IMPLANT
KIT MICROPUNCTURE NIT STIFF (SHEATH) IMPLANT
LUBRICANT ROTAGLIDE 20CC VIAL (MISCELLANEOUS) IMPLANT
MAT PREVALON FULL STRYKER (MISCELLANEOUS) IMPLANT
PACK CARDIAC CATHETERIZATION (CUSTOM PROCEDURE TRAY) ×1 IMPLANT
SHEATH CATAPULT 8F 45 ST (SHEATH) IMPLANT
SHEATH PINNACLE 6F 10CM (SHEATH) IMPLANT
SHEATH PINNACLE 8F 10CM (SHEATH) IMPLANT
SHEATH PROBE COVER 6X72 (BAG) IMPLANT
STENT SYNERGY XD 3.0X16 (Permanent Stent) IMPLANT
SYNERGY XD 3.0X16 (Permanent Stent) ×1 IMPLANT
TRANSDUCER W/STOPCOCK (MISCELLANEOUS) ×1 IMPLANT
TUBING CIL FLEX 10 FLL-RA (TUBING) ×1 IMPLANT
WIRE ASAHI FIELDER XT 190CM (WIRE) IMPLANT
WIRE ASAHI PROWATER 180CM (WIRE) IMPLANT
WIRE EMERALD 3MM-J .035X150CM (WIRE) IMPLANT
WIRE HI TORQ WHISPER MS 300CM (WIRE) IMPLANT

## 2023-01-31 NOTE — Interval H&P Note (Signed)
History and Physical Interval Note:  01/31/2023 11:16 AM  Craig Copeland  has presented today for surgery, with the diagnosis of cad.  The various methods of treatment have been discussed with the patient and family. After consideration of risks, benefits and other options for treatment, the patient has consented to  Procedure(s): CORONARY CTO INTERVENTION (N/A) CORONARY ATHERECTOMY (N/A) as a surgical intervention.  The patient's history has been reviewed, patient examined, no change in status, stable for surgery.  I have reviewed the patient's chart and labs.  Questions were answered to the patient's satisfaction.    Cath Lab Visit (complete for each Cath Lab visit)  Clinical Evaluation Leading to the Procedure:   ACS: Yes.    Non-ACS:    Anginal Classification: CCS IV  Anti-ischemic medical therapy: Maximal Therapy (2 or more classes of medications)  Non-Invasive Test Results: No non-invasive testing performed  Prior CABG: No previous CABG       [name] [facility] 01/31/2023 11:16 AM

## 2023-01-31 NOTE — H&P (View-Only) (Signed)
Rounding Note    Patient Name: Craig Copeland Date of Encounter: 01/31/2023  [facility] Cardiologist: Thurmon Fair, MD   Subjective   No complaints, planned for cardiac cath.  Inpatient Medications    Scheduled Meds:  aspirin EC  81 mg Oral Daily   clopidogrel  75 mg Oral Daily   ezetimibe  10 mg Oral QPM   metoprolol tartrate  25 mg Oral BID   pravastatin  80 mg Oral QHS   ranolazine  1,000 mg Oral BID   Continuous Infusions:  sodium chloride 1 mL/kg/hr (01/31/23 0555)   heparin 1,300 Units/hr (01/31/23 0400)   PRN Meds: acetaminophen, nitroGLYCERIN, ondansetron (ZOFRAN) IV   Vital Signs    Vitals:   01/30/23 2219 01/30/23 2230 01/30/23 2250 01/31/23 0448  BP:  100/87 139/72 138/68  Pulse: 61 60 66 73  Resp: (!) 21 20    Temp: 98.2 F (36.8 C)  98.2 F (36.8 C) (!) 97.5 F (36.4 C)  TempSrc: Oral  Oral Oral  SpO2: 97% 97% 98% 100%  Weight:   132.5 kg 132 kg  Height:        Intake/Output Summary (Last 24 hours) at 01/31/2023 0926 Last data filed at 01/31/2023 0617 Gross per 24 hour  Intake 215.5 ml  Output 400 ml  Net -184.5 ml      01/31/2023    4:48 AM 01/30/2023   10:50 PM 01/30/2023    5:13 PM  Last 3 Weights  Weight (lbs) 291 lb 292 lb 290 lb  Weight (kg) 131.997 kg 132.45 kg 131.543 kg      Telemetry    Sinus Rhythm - Personally Reviewed  Physical Exam   GEN: No acute distress.   Neck: No JVD Cardiac: RRR, no murmurs, rubs, or gallops.  Respiratory: Clear to auscultation bilaterally. GI: Soft, nontender, non-distended  MS: No edema; No deformity. Neuro:  Nonfocal  Psych: Normal affect   Labs    High Sensitivity Troponin:   Recent Labs  Lab 01/06/23 1458 01/06/23 1740 01/30/23 1508 01/30/23 1929  TROPONINIHS 8 8 4 5      Chemistry Recent Labs  Lab 01/30/23 1207 01/30/23 1929 01/30/23 1949 01/31/23 0253  NA 140 138  --  140  K 2.8* 3.7  --  4.6  CL 118* 104  --  102  CO2 15* 24  --  25  GLUCOSE 88 123*   --  105*  BUN 12 12  --  15  CREATININE 0.69 1.00  --  1.25*  CALCIUM 6.1* 9.3  --  9.3  MG  --   --  2.1  --   PROT  --  7.0  --   --   ALBUMIN  --  3.8  --   --   AST  --  32  --   --   ALT  --  45*  --   --   ALKPHOS  --  43  --   --   BILITOT  --  1.2  --   --   GFRNONAA >60 >60  --  >60  ANIONGAP 7 10  --  13    Lipids  Recent Labs  Lab 01/31/23 0253  CHOL 116  TRIG 81  HDL 39*  LDLCALC 61  CHOLHDL 3.0    Hematology Recent Labs  Lab 01/30/23 1207 01/31/23 0253  WBC 5.1 6.1  RBC 3.95* 4.78  HGB 11.6* 14.3  HCT 39.1 43.5  MCV 99.0  91.0  MCH 29.4 29.9  MCHC 29.7* 32.9  RDW 13.7 13.6  PLT 131* 180   Thyroid  Recent Labs  Lab 01/30/23 1949  TSH 1.326    BNPNo results for input(s): "BNP", "PROBNP" in the last 168 hours.  DDimer No results for input(s): "DDIMER" in the last 168 hours.   Radiology    DG Chest Portable 1 View  Result Date: 01/30/2023 CLINICAL DATA:  Chest pain. EXAM: PORTABLE CHEST 1 VIEW COMPARISON:  Chest x-ray dated January 06, 2023. FINDINGS: The heart size and mediastinal contours are within normal limits. Both lungs are clear. The visualized skeletal structures are unremarkable. IMPRESSION: No active disease. Electronically Signed   By: Obie Dredge M.D.   On: 01/30/2023 13:35    Cardiac Studies   Heart cath 01/01/23: ([facility]) Summary:  Unsuccessful PCI of mid right coronary artery.  The procedure was aborted  on account of inability to advance the interventional wire more than 20 to  30 mm distal to the lesion, and the inability to advance a 1.5 mm diameter  balloon and low-profile exchange catheter across the lesion.  Failure may  be due to severe calcification and rigidity of the vessel versus  inadvertent passing of the wire behind stent struts due to insufficient  overlap of sequential stents in the proximal and mid RCA.    Plan:  Pursue aggressive medical therapy aimed at angina prophylaxis.      Heart cath 12/31/22:  ([facility]) LEFT VENTRICLE  End-diastolic pressure 25 mmHg.  Angiography not performed.   Summary:  The angiogram showed a focal area of 99-100% luminal narrowing with TIMI I  flow in the mid RCA.  The occlusion appears to be due to the edge  restenosis of 2 previously placed stents which do not appear to overlap.   Attempts to advance a wire across the lesion were difficult but  successful.  However, attempts to complete the PCI were unsuccessful due  to to a combination of heavy vessel calcification, tortuosity, and poor  radial guide support.  In addition, it is likely that the wire passed  underneath a stent strut.   Calcification without luminal narrowing or stenosis of the left coronary  artery.  Left ventricular end-diastolic pressure 25 mmHg.   Plan:  Attempt PCI with right common femoral artery access and larger diameter  guide catheter tomorrow.  Continue telemetry monitoring.  Continue antiplatelet therapy.     Patient Profile     67 y.o. male with CAD, HTN, HLD, obesity, and OSA not currently on CPAP who was seen 01/30/2023 for the evaluation of chest pain.   Assessment & Plan    Unstable angina CAD s/p remote PCI of RCA -- has a known occlusion w/ ISR of the RCA with 2 failed previous attempts. Was initially scheduled for outpatient CTO but presented with chest pain, therefore admitted overnight with plans for cath today. Recent echo at [facility] with LVEF of 60-65%, no rWMA -- continue IV heparin, ASA, plavix, metoprolol 25mg  BID, ranexa 1000mg  BID  HLD -- issues with myaglias on crestor/lipitor, has been on pravastatin -- planned for outpatient referral to lipid clinic from previous office visits  HTN -- did have some soft BPs while in the ED but now stable. Reports he has had some lower blood pressures at home recently as well with meds reduced. -- continue metoprolol 25mg  BID, holding hydrochlorothiazide, losartan, Imdur for now  Hypokalemia -- K+ 2.8 on  admission, 4.6 this morning -- may consider  holding hydrochlorothiazide at discharge  AKI -- Cr 1.0>>1.25 this morning, suspect in the setting of soft blood pressures on admission -- receiving pre-cath IVFs  -- follow BMET  For questions or updates, please contact [facility] Please consult [email or web address] for contact info under        Signed, Laverda Page, NP  01/31/2023, 9:26 AM    Patient seen, examined. Available data reviewed. Agree with findings, assessment, and plan as outlined by Laverda Page, NP.  Patient is independently interviewed and examined.  He has had minor chest discomfort overnight currently rated at 2/10 but feels comfortable.  HEENT normal, JVP normal, lungs clear bilaterally, heart regular rate and rhythm with no murmur gallop, abdomen soft, obese, nontender, extremities with no edema.  Plans for complex PCI of the RCA today after 2 recent failed attempts at RCA PCI.  Case discussed with Dr. [place] who has reviewed the patient's films.  All the patient's questions have been answered.  Full informed consent for the procedure is obtained.  Otherwise as above.  Tonny Bollman, M.D. 01/31/2023 10:43 AM

## 2023-01-31 NOTE — Progress Notes (Signed)
ANTICOAGULATION CONSULT NOTE - Follow Up  Pharmacy Consult for heparin infusion Indication: chest pain/ACS  Allergies  Allergen Reactions   Atorvastatin     numbness in his fingertips   Augmentin [Amoxicillin-Pot Clavulanate] Other (See Comments)    Pt unsure of Allergy    Rosuvastatin Other (See Comments)    numbness in his fingertips    Patient Measurements: Height: 6\' 1"  (185.4 cm) Weight: 132 kg (291 lb) IBW/kg (Calculated) : 79.9 Heparin Dosing Weight: 110 kg  Vital Signs: Temp: 97.5 F (36.4 C) (08/01 0448) Temp Source: Oral (08/01 0448) BP: 138/68 (08/01 0448) Pulse Rate: 73 (08/01 0448)  Labs: Recent Labs    01/30/23 1207 01/30/23 1508 01/30/23 1929 01/31/23 0253 01/31/23 0731  HGB 11.6*  --   --  14.3  --   HCT 39.1  --   --  43.5  --   PLT 131*  --   --  180  --   HEPARINUNFRC  --   --   --  0.34 0.39  CREATININE 0.69  --  1.00 1.25*  --   TROPONINIHS  --  4 5  --   --     Estimated Creatinine Clearance: 85 mL/min (A) (by C-G formula based on SCr of 1.25 mg/dL (H)).   Medical History: Past Medical History:  Diagnosis Date   CAD (coronary artery disease)    Dizziness    Dyslipidemia    Flu 06/2016   Had again 01/208 with PNA   Obesity    OSA (obstructive sleep apnea)    Systemic hypertension    Vasospastic angina [facility])     Assessment: 66yoM w/ known history of CAD, PCI w/ DES in 2004, and recent unsuccessful PCI 01/01/23. Presenting with chief complaint of chest pain with plan for PCI 01/31/23. Prior to admission was on DAPT with ASA and clopidogrel, no anticoagulation. Pharmacy is consulted to dose heparin.  Heparin level returned at 0.39 on 1300 units/hr.   Goal of Therapy:  Heparin level 0.3-0.7 units/ml Monitor platelets by anticoagulation protocol: Yes   Plan:  Continue heparin infusion at 1300 units/hr Will follow plans post cath  Harland German, PharmD Clinical Pharmacist **Pharmacist phone directory can now be found on  [email or web address] ([email or web address]).  Listed under [facility].

## 2023-01-31 NOTE — ED Notes (Signed)
Pt transferred to inpatient unit via monitored stretcher by this RN.  All belongings taken with pt.

## 2023-01-31 NOTE — Plan of Care (Signed)
  Problem: Education: Goal: Understanding of cardiac disease, CV risk reduction, and recovery process will improve Outcome: Progressing   Problem: Activity: Goal: Ability to tolerate increased activity will improve Outcome: Progressing   Problem: Activity: Goal: Ability to return to baseline activity level will improve Outcome: Progressing

## 2023-01-31 NOTE — Progress Notes (Signed)
ANTICOAGULATION CONSULT NOTE - Follow Up  Pharmacy Consult for heparin infusion Indication: chest pain/ACS  Allergies  Allergen Reactions   Augmentin [Amoxicillin-Pot Clavulanate] Other (See Comments)    Pt unsure of Allergy     Patient Measurements: Height: 6\' 1"  (185.4 cm) Weight: 132.5 kg (292 lb) IBW/kg (Calculated) : 79.9 Heparin Dosing Weight: 110 kg  Vital Signs: Temp: 98.2 F (36.8 C) (07/31 2250) Temp Source: Oral (07/31 2250) BP: 139/72 (07/31 2250) Pulse Rate: 66 (07/31 2250)  Labs: Recent Labs    01/30/23 1207 01/30/23 1508 01/30/23 1929 01/31/23 0253  HGB 11.6*  --   --  14.3  HCT 39.1  --   --  43.5  PLT 131*  --   --  180  HEPARINUNFRC  --   --   --  0.34  CREATININE 0.69  --  1.00  --   TROPONINIHS  --  4 5  --     Estimated Creatinine Clearance: 106.5 mL/min (by C-G formula based on SCr of 1 mg/dL).   Medical History: Past Medical History:  Diagnosis Date   CAD (coronary artery disease)    Dizziness    Dyslipidemia    Flu 06/2016   Had again 01/208 with PNA   Obesity    OSA (obstructive sleep apnea)    Systemic hypertension    Vasospastic angina [facility])     Assessment: 66yoM w/ known history of CAD, PCI w/ DES in 2004, and recent unsuccessful PCI 01/01/23. Presenting with chief complaint of chest pain with plan for PCI 01/31/23. Prior to admission was on DAPT with ASA and clopidogrel, no anticoagulation. Pharmacy is consulted to dose heparin.  8/1 AM: Heparin level returned at 0.34 on 1300 units/hr. No signs/symptoms of bleeding noted or issues with the infusion running continuously. CBC stable  Goal of Therapy:  Heparin level 0.3-0.7 units/ml Monitor platelets by anticoagulation protocol: Yes   Plan:  Continue heparin infusion at 1300 units/hr Follow up heparin level at 0800 on 01/31/23  Arabella Merles, PharmD. Clinical Pharmacist 01/31/2023 3:45 AM

## 2023-01-31 NOTE — Progress Notes (Signed)
31fr sheath aspirated and removed from right femoral artery, 26fr venous sheath aspirated and removed 10 minutes later. Manual pressure continued over both sites for 40 minutes. Site level 1 due to bruising. No hematoma. Tegaderm dressing applied , bedrest instructions given. Bilateral dp and pt pulses easily present with doppler.    Bedrest begins at 19:25:00

## 2023-01-31 NOTE — Progress Notes (Deleted)
Pt refused pre-procedure chest clipping this am. Per pt if the procedure isn't until 2pm he doesn't need to be shaved at this time. Pt also refused standing weight.

## 2023-01-31 NOTE — Progress Notes (Addendum)
Rounding Note    Patient Name: Craig Copeland Date of Encounter: 01/31/2023  [facility] Cardiologist: Thurmon Fair, MD   Subjective   No complaints, planned for cardiac cath.  Inpatient Medications    Scheduled Meds:  aspirin EC  81 mg Oral Daily   clopidogrel  75 mg Oral Daily   ezetimibe  10 mg Oral QPM   metoprolol tartrate  25 mg Oral BID   pravastatin  80 mg Oral QHS   ranolazine  1,000 mg Oral BID   Continuous Infusions:  sodium chloride 1 mL/kg/hr (01/31/23 0555)   heparin 1,300 Units/hr (01/31/23 0400)   PRN Meds: acetaminophen, nitroGLYCERIN, ondansetron (ZOFRAN) IV   Vital Signs    Vitals:   01/30/23 2219 01/30/23 2230 01/30/23 2250 01/31/23 0448  BP:  100/87 139/72 138/68  Pulse: 61 60 66 73  Resp: (!) 21 20    Temp: 98.2 F (36.8 C)  98.2 F (36.8 C) (!) 97.5 F (36.4 C)  TempSrc: Oral  Oral Oral  SpO2: 97% 97% 98% 100%  Weight:   132.5 kg 132 kg  Height:        Intake/Output Summary (Last 24 hours) at 01/31/2023 0926 Last data filed at 01/31/2023 0617 Gross per 24 hour  Intake 215.5 ml  Output 400 ml  Net -184.5 ml      01/31/2023    4:48 AM 01/30/2023   10:50 PM 01/30/2023    5:13 PM  Last 3 Weights  Weight (lbs) 291 lb 292 lb 290 lb  Weight (kg) 131.997 kg 132.45 kg 131.543 kg      Telemetry    Sinus Rhythm - Personally Reviewed  Physical Exam   GEN: No acute distress.   Neck: No JVD Cardiac: RRR, no murmurs, rubs, or gallops.  Respiratory: Clear to auscultation bilaterally. GI: Soft, nontender, non-distended  MS: No edema; No deformity. Neuro:  Nonfocal  Psych: Normal affect   Labs    High Sensitivity Troponin:   Recent Labs  Lab 01/06/23 1458 01/06/23 1740 01/30/23 1508 01/30/23 1929  TROPONINIHS 8 8 4 5      Chemistry Recent Labs  Lab 01/30/23 1207 01/30/23 1929 01/30/23 1949 01/31/23 0253  NA 140 138  --  140  K 2.8* 3.7  --  4.6  CL 118* 104  --  102  CO2 15* 24  --  25  GLUCOSE 88 123*   --  105*  BUN 12 12  --  15  CREATININE 0.69 1.00  --  1.25*  CALCIUM 6.1* 9.3  --  9.3  MG  --   --  2.1  --   PROT  --  7.0  --   --   ALBUMIN  --  3.8  --   --   AST  --  32  --   --   ALT  --  45*  --   --   ALKPHOS  --  43  --   --   BILITOT  --  1.2  --   --   GFRNONAA >60 >60  --  >60  ANIONGAP 7 10  --  13    Lipids  Recent Labs  Lab 01/31/23 0253  CHOL 116  TRIG 81  HDL 39*  LDLCALC 61  CHOLHDL 3.0    Hematology Recent Labs  Lab 01/30/23 1207 01/31/23 0253  WBC 5.1 6.1  RBC 3.95* 4.78  HGB 11.6* 14.3  HCT 39.1 43.5  MCV 99.0  91.0  MCH 29.4 29.9  MCHC 29.7* 32.9  RDW 13.7 13.6  PLT 131* 180   Thyroid  Recent Labs  Lab 01/30/23 1949  TSH 1.326    BNPNo results for input(s): "BNP", "PROBNP" in the last 168 hours.  DDimer No results for input(s): "DDIMER" in the last 168 hours.   Radiology    DG Chest Portable 1 View  Result Date: 01/30/2023 CLINICAL DATA:  Chest pain. EXAM: PORTABLE CHEST 1 VIEW COMPARISON:  Chest x-ray dated January 06, 2023. FINDINGS: The heart size and mediastinal contours are within normal limits. Both lungs are clear. The visualized skeletal structures are unremarkable. IMPRESSION: No active disease. Electronically Signed   By: Obie Dredge M.D.   On: 01/30/2023 13:35    Cardiac Studies   Heart cath 01/01/23: ([facility]) Summary:  Unsuccessful PCI of mid right coronary artery.  The procedure was aborted  on account of inability to advance the interventional wire more than 20 to  30 mm distal to the lesion, and the inability to advance a 1.5 mm diameter  balloon and low-profile exchange catheter across the lesion.  Failure may  be due to severe calcification and rigidity of the vessel versus  inadvertent passing of the wire behind stent struts due to insufficient  overlap of sequential stents in the proximal and mid RCA.    Plan:  Pursue aggressive medical therapy aimed at angina prophylaxis.      Heart cath 12/31/22:  ([facility]) LEFT VENTRICLE  End-diastolic pressure 25 mmHg.  Angiography not performed.   Summary:  The angiogram showed a focal area of 99-100% luminal narrowing with TIMI I  flow in the mid RCA.  The occlusion appears to be due to the edge  restenosis of 2 previously placed stents which do not appear to overlap.   Attempts to advance a wire across the lesion were difficult but  successful.  However, attempts to complete the PCI were unsuccessful due  to to a combination of heavy vessel calcification, tortuosity, and poor  radial guide support.  In addition, it is likely that the wire passed  underneath a stent strut.   Calcification without luminal narrowing or stenosis of the left coronary  artery.  Left ventricular end-diastolic pressure 25 mmHg.   Plan:  Attempt PCI with right common femoral artery access and larger diameter  guide catheter tomorrow.  Continue telemetry monitoring.  Continue antiplatelet therapy.     Patient Profile     67 y.o. male with CAD, HTN, HLD, obesity, and OSA not currently on CPAP who was seen 01/30/2023 for the evaluation of chest pain.   Assessment & Plan    Unstable angina CAD s/p remote PCI of RCA -- has a known occlusion w/ ISR of the RCA with 2 failed previous attempts. Was initially scheduled for outpatient CTO but presented with chest pain, therefore admitted overnight with plans for cath today. Recent echo at [facility] with LVEF of 60-65%, no rWMA -- continue IV heparin, ASA, plavix, metoprolol 25mg  BID, ranexa 1000mg  BID  HLD -- issues with myaglias on crestor/lipitor, has been on pravastatin -- planned for outpatient referral to lipid clinic from previous office visits  HTN -- did have some soft BPs while in the ED but now stable. Reports he has had some lower blood pressures at home recently as well with meds reduced. -- continue metoprolol 25mg  BID, holding hydrochlorothiazide, losartan, Imdur for now  Hypokalemia -- K+ 2.8 on  admission, 4.6 this morning -- may consider  holding hydrochlorothiazide at discharge  AKI -- Cr 1.0>>1.25 this morning, suspect in the setting of soft blood pressures on admission -- receiving pre-cath IVFs  -- follow BMET  For questions or updates, please contact [facility] Please consult [email or web address] for contact info under        Signed, Laverda Page, NP  01/31/2023, 9:26 AM    Patient seen, examined. Available data reviewed. Agree with findings, assessment, and plan as outlined by Laverda Page, NP.  Patient is independently interviewed and examined.  He has had minor chest discomfort overnight currently rated at 2/10 but feels comfortable.  HEENT normal, JVP normal, lungs clear bilaterally, heart regular rate and rhythm with no murmur gallop, abdomen soft, obese, nontender, extremities with no edema.  Plans for complex PCI of the RCA today after 2 recent failed attempts at RCA PCI.  Case discussed with Dr. [place] who has reviewed the patient's films.  All the patient's questions have been answered.  Full informed consent for the procedure is obtained.  Otherwise as above.  Tonny Bollman, M.D. 01/31/2023 10:43 AM

## 2023-02-01 ENCOUNTER — Encounter (HOSPITAL_COMMUNITY): Payer: Self-pay | Admitting: Cardiology

## 2023-02-01 ENCOUNTER — Other Ambulatory Visit (HOSPITAL_COMMUNITY): Payer: Self-pay

## 2023-02-01 DIAGNOSIS — I200 Unstable angina: Secondary | ICD-10-CM | POA: Diagnosis not present

## 2023-02-01 MED ORDER — AMLODIPINE BESYLATE 5 MG PO TABS: 5.0000 mg | ORAL_TABLET | Freq: Every evening | ORAL | Status: DC

## 2023-02-01 MED ORDER — RANOLAZINE ER 1000 MG PO TB12
1000.0000 mg | ORAL_TABLET | Freq: Two times a day (BID) | ORAL | 0 refills | Status: AC
  Filled 2023-02-01: qty 60, 30d supply, fill #0

## 2023-02-01 MED ORDER — AMLODIPINE BESYLATE 5 MG PO TABS
ORAL_TABLET | ORAL | 3 refills | Status: DC
Start: 2023-02-01 — End: 2023-10-30
  Filled 2023-02-01: qty 30, 30d supply, fill #0

## 2023-02-01 MED FILL — Atropine Sulfate Soln Prefill Syr 1 MG/10ML (0.1 MG/ML): INTRAMUSCULAR | Qty: 10 | Status: AC

## 2023-02-01 NOTE — Plan of Care (Signed)

## 2023-02-01 NOTE — Progress Notes (Signed)
CARDIAC REHAB PHASE I   PRE:  Rate/Rhythm: 67 NSR  BP:  Sitting: 131/70      SaO2: 95 RA  MODE:  Ambulation: 100 ft   AD:   no AD  POST:  Rate/Rhythm: 78 NSR  BP:  Sitting: 131/79      SaO2: 95 RA  Pt amb with supervision assistance, pt reports 1-2/10 CP and  denies SOB during amb  Pt c/p of soreness in chest and R groin, he was able to ambulate at slow pace without worsening symptoms.  I encouraged pt to consider cardiac rehab in the future and began a light walking program at home to help build collaterals. 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 I encouraged pt to consider a medical ID bracelet which wife was in agreement. Pt received education on [facility] diet, restrictions, NTG use and calling 911, ex guidelines, and CR at [facility].   Faustino Congress  ACSM-CEP 9:11 AM 02/01/2023    Service time is from 0830 to 0905.

## 2023-02-01 NOTE — Discharge Summary (Addendum)
Discharge Summary    Patient ID: Craig Copeland MRN: [id number]; DOB: 07-Nov-1955  Admit date: 01/30/2023 Discharge date: 02/01/2023  PCP:  [name], MD   [facility] Providers Cardiologist:  Thurmon Fair, MD     Discharge Diagnoses    Principal Problem:   Unstable angina [facility]) Active Problems:   Hyperlipidemia   Essential hypertension  Diagnostic Studies/Procedures    Cath: 01/31/2023    Ost RCA to Prox RCA lesion is 25% stenosed.   Prox RCA to Mid RCA lesion is 99% stenosed.   A drug-eluting stent was successfully placed using a SYNERGY XD 3.0X16.   Post intervention, there is a 0% residual stenosis.   Post intervention, there is a 99% residual stenosis.   Recommend uninterrupted dual antiplatelet therapy with Aspirin 81mg  daily and Clopidogrel 75mg  daily for a minimum of 6 months (stable ischemic heart disease-Class I recommendation).   Unsuccessful CTO PCI of the mid RCA. Inability to cross with a balloon. Wire appeared to cross but was likely subintimal throughout.  Stenting of the proximal RCA to cover a guide induced vessel dissection   Plan: continue medical therapy. Would need a minimum of 8 weeks for vessel to heal and given multiple attempts to cross unsuccessfully now I think reattempt at PCI is unlikely to be successful.   Diagnostic Dominance: Right  Intervention   _____________   History of Present Illness     Craig Copeland is a 67 y.o. male with a history of CAD with PCI-mRCA 2004 (overlapping DES), moderate to severe ostial lesion of a small to medium ramus too small for PCI. Heart cath in 2018 with unchanged anatomy. He was seen 12/28/22 at [facility] for chest pain and dizziness that occurred with exertion. He ruled out with negative enzymes and nonischemic EKG. However, nuclear stress test showed inducible ischemia. He proceeded to heart catheterization 12/31/22 that showed a lesion in th RCA that could not be crossed, 99-100% in stent  restenosis with TIMI-1 flow.  PCI was attempted on 01/01/23 but they were unable to cross the lesion again and recommended medical therapy. Echocardiogram showed a preserved EF 60-65%, no RWMA, mild LAE, no significant valvular disease. He was subsequently seen in the ER for groin pain, CT negative for PSA. He was discharged without admission.     He was seen by Reather Littler NP 01/09/23 and reported ongoing 1/10 chest pressure. He has been having ongoing CP intermittently for a few months, somewhat improved by increasing imdur. She further increased imdur to 45 mg qAM and 30 mg qPM.  He was also started on ranexa. He continued ASA and plavix.  Previously did not tolerate BB due to bradycardia, but has done well with lopressor 25 mg BID for the past week.  Films were obtained and reviewed by Dr. Swaziland and subsequently scheduled for CTO/coronary atherectomy 01/31/23.    Unfortunately, he presented to [facility] 01/30/23 for chest pain. HR 67, BP 119/75, O2 99% on room air.  Hb 11.6, PLT 131, WBC 5.1 sCr 0.69, CO2 15, Ca 6.1, K 2.8 HS troponin 4, delta pending EKG with inferior Q waves (old)     He was given 325 mg ASA, calcium gluconate, and PO K. Cardiology asked to evaluate for admission.    He recounted dizziness, blurry vision, and chest pressure that prompted his [facility] admission. The morning of admission, he described walking to another room in his home and developing 9/10 chest pressure that felt like an elephant on  his chest. No associated symptoms.  He took 4 NTG tablets over the course of 30 min with minimal relief. Of note, NTG did not tingle under his tongue and he did not experience hypotension, question efficacy. Here, he has not received additional NTG, CP is 3/10. EKG with inferior Q waves (old).    Of  note, HS troponin was negative during his [facility] admission.    He quit wearing CPAP greater than 5 years ago. [facility] recommended for repeat sleep study to reinstate CPAP - awaiting sleep study.      Hospital Course     Unstable angina CAD s/p remote PCI of RCA -- has a known occlusion w/ ISR of the RCA with 2 failed previous attempts. Was initially scheduled for outpatient CTO but presented with chest pain, therefore admitted overnight with plans for cath today. Recent echo at [facility] with LVEF of 60-65%, no rWMA -- treated with IV heparin, ASA, plavix, metoprolol 25mg  BID, ranexa 1000mg  BID -- underwent cardiac cath with Dr. Swaziland and Eldridge Dace yesterday but unsuccessful CTO PCI of the mRCA, did have guide induced vessel dissection of pRCA treated with DES x1. Will need continued DAPT with ASA/plavix. 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 Reports he has had some lower blood pressures at home recently as well with meds reduced. -- continue metoprolol 25mg  BID, losartan, Imdur. Stopping hydrochlorothiazide at discharge. Will increase norvasc to 5mg  daily   Hypokalemia Hypocalcemia  -- resolved -- holding hydrochlorothiazide at discharge   AKI -- Cr 1.0>>1.25>>1.1, resolved prior to discharge  General: Well developed, well nourished, male appearing in no acute distress. Head: Normocephalic, atraumatic.  Neck: Supple without bruits, JVD. Lungs:  Resp regular and unlabored, CTA. Heart: RRR, S1, S2, no S3, S4, or murmur; no rub. Abdomen: Soft, non-tender, non-distended with normoactive bowel sounds. No hepatomegaly. No rebound/guarding. No obvious abdominal masses. Extremities: No clubbing, cyanosis, edema. Distal pedal pulses are 2+ bilaterally. Right femoral cath site stable with mild bruising, no  hematoma Neuro: Alert and oriented X 3. Moves all extremities spontaneously. Psych: Normal affect.  Patient was seen by Dr. Lynnette Caffey and deemed stable for discharge home. Follow up in the office arranged. Medications sent to pharmacy.     _____________  Discharge Vitals Blood pressure 131/70, pulse 67, temperature 97.8 F (36.6 C), temperature source Oral, resp. rate 16, height 6\' 1"  (1.854 m), weight 132.6 kg, SpO2 98%.  Filed Weights   01/30/23 2250 01/31/23 0448 01/31/23 1949  Weight: 132.5 kg 132 kg 132.6 kg    Labs & Radiologic Studies    CBC Recent Labs    01/30/23 1207 01/31/23 0253 01/31/23 2348  WBC 5.1 6.1 6.3  NEUTROABS 3.2  --   --   HGB 11.6* 14.3 13.7  HCT 39.1 43.5 41.8  MCV 99.0 91.0 90.5  PLT 131* 180 164   Basic Metabolic Panel Recent Labs    54/09/81 1949 01/31/23 0253 01/31/23 2348  NA  --  140 138  K  --  4.6 3.6  CL  --  102 103  CO2  --  25 24  GLUCOSE  --  105* 98  BUN  --  15 15  CREATININE  --  1.25* 1.11  CALCIUM  --  9.3 8.7*  MG 2.1  --   --    Liver Function Tests Recent Labs    01/30/23 1929  AST 32  ALT 45*  ALKPHOS 43  BILITOT 1.2  PROT 7.0  ALBUMIN 3.8   No results for input(s): "LIPASE", "AMYLASE" in the last 72 hours. High Sensitivity Troponin:   Recent Labs  Lab 01/06/23 1458 01/06/23 1740 01/30/23 1508 01/30/23 1929  TROPONINIHS 8 8 4 5     BNP Invalid input(s): "POCBNP" D-Dimer No results for input(s): "DDIMER" in the last 72 hours. Hemoglobin A1C No results for input(s): "HGBA1C" in the last 72 hours. Fasting Lipid Panel Recent Labs    01/31/23 0253  CHOL 116  HDL 39*  LDLCALC 61  TRIG 81  CHOLHDL 3.0   Thyroid Function Tests Recent Labs    01/30/23 1949  TSH 1.326   _____________  CARDIAC CATHETERIZATION  Result Date: 02/01/2023   Ost RCA to Prox RCA lesion is 25% stenosed.   Prox RCA to Mid RCA lesion is 99% stenosed.   A drug-eluting stent was successfully placed using a SYNERGY XD  3.0X16.   Post intervention, there is a 0% residual stenosis.   Post intervention, there is a 99% residual stenosis.   Recommend uninterrupted dual antiplatelet therapy with Aspirin 81mg  daily and Clopidogrel 75mg  daily for a minimum of 6 months (stable ischemic heart disease-Class I recommendation). Unsuccessful CTO PCI of the mid RCA. Inability to cross with a balloon. Wire appeared to cross but was likely subintimal throughout. Stenting of the proximal RCA to cover a guide induced vessel dissection Plan: continue medical therapy. Would need a minimum of 8 weeks for vessel to heal and given multiple attempts to cross unsuccessfully now I think reattempt at PCI is unlikely to be successful.   DG Chest Portable 1 View  Result Date: 01/30/2023 CLINICAL DATA:  Chest pain. EXAM: PORTABLE CHEST 1 VIEW COMPARISON:  Chest x-ray dated January 06, 2023. FINDINGS: The heart size and mediastinal contours are within normal limits. Both lungs are clear. The visualized skeletal structures are unremarkable. IMPRESSION: No active disease. Electronically Signed   By: Obie Dredge M.D.   On: 01/30/2023 13:35   CT ABDOMEN PELVIS W CONTRAST  Result Date: 01/06/2023 CLINICAL DATA:  Abdominal pain EXAM: CT ABDOMEN AND PELVIS WITH CONTRAST TECHNIQUE: Multidetector CT imaging of the abdomen and pelvis was performed using the standard protocol following bolus administration of intravenous contrast. RADIATION DOSE REDUCTION: This exam was performed according to the departmental dose-optimization program which includes automated exposure control, adjustment of the mA and/or kV according to patient size and/or use of iterative reconstruction technique. CONTRAST:  75mL OMNIPAQUE IOHEXOL 350 MG/ML SOLN COMPARISON:  None Available. FINDINGS: Lower chest: No acute abnormality. Left and right coronary artery calcifications and stents. Hepatobiliary: No solid liver abnormality is seen. Hepatomegaly, maximum coronal span 22.8 cm. Hepatic  steatosis no gallstones, gallbladder wall thickening, or biliary dilatation. Pancreas: Unremarkable. No pancreatic ductal dilatation or surrounding inflammatory changes. Spleen: Normal in size without significant abnormality. Adrenals/Urinary Tract: Adrenal glands are unremarkable. Kidneys are normal, without renal calculi, solid lesion, or hydronephrosis. Bladder is unremarkable. Stomach/Bowel: Stomach is within normal limits. Appendix appears normal. No evidence of bowel wall thickening, distention, or inflammatory changes. Moderate burden of stool and stool balls throughout the colon and rectum. Vascular/Lymphatic: Aortic atherosclerosis. No enlarged  abdominal or pelvic lymph nodes. Reproductive: No mass or other significant abnormality. Other: Small, fat containing umbilical hernia.  No ascites. Musculoskeletal: No acute or significant osseous findings. IMPRESSION: 1. No acute CT findings of the abdomen or pelvis to explain abdominal pain. 2. Moderate burden of stool and stool balls throughout the colon and rectum. 3. Hepatomegaly and hepatic steatosis. 4. Coronary artery disease. Aortic Atherosclerosis (ICD10-I70.0). Electronically Signed   By: Jearld Lesch M.D.   On: 01/06/2023 18:09   DG Chest 2 View  Result Date: 01/06/2023 CLINICAL DATA:  Chest pain. EXAM: CHEST - 2 VIEW COMPARISON:  10/30/2018. FINDINGS: Cardiac silhouette is normal in size. No mediastinal or hilar masses. No evidence of adenopathy. Clear lungs.  No pleural effusion or pneumothorax. Skeletal structures are intact IMPRESSION: No active cardiopulmonary disease. Electronically Signed   By: Amie Portland M.D.   On: 01/06/2023 15:47   Disposition   Pt is being discharged home today in good condition.  Follow-up Plans & Appointments     Discharge Instructions     Amb Referral to Cardiac Rehabilitation   Complete by: As directed    Diagnosis: Coronary Stents   After initial evaluation and assessments completed: Virtual Based  Care may be provided alone or in conjunction with Phase 2 Cardiac Rehab based on patient barriers.: Yes   Intensive Cardiac Rehabilitation (ICR) [facility] location only OR Traditional Cardiac Rehabilitation (TCR) *If criteria for ICR are not met will enroll in TCR [facility] only): Yes   Call MD for:  difficulty breathing, headache or visual disturbances   Complete by: As directed    Call MD for:  persistant dizziness or light-headedness   Complete by: As directed    Call MD for:  redness, tenderness, or signs of infection (pain, swelling, redness, odor or green/yellow discharge around incision site)   Complete by: As directed    Diet - low sodium heart healthy   Complete by: As directed    Discharge instructions   Complete by: As directed    Groin Site Care Refer to this sheet in the next few weeks. These instructions provide you with information on caring for yourself after your procedure. Your caregiver may also give you more specific instructions. Your treatment has been planned according to current medical practices, but problems sometimes occur. Call your caregiver if you have any problems or questions after your procedure. HOME CARE INSTRUCTIONS You may shower 24 hours after the procedure. Remove the bandage (dressing) and gently wash the site with plain soap and water. Gently pat the site dry.  Do not apply powder or lotion to the site.  Do not sit in a bathtub, swimming pool, or whirlpool for 5 to 7 days.  No bending, squatting, or lifting anything over 10 pounds (4.5 kg) as directed by your caregiver.  Inspect the site at least twice daily.  Do not drive home if you are discharged the same day of the procedure. Have someone else drive you.  You may drive 24 hours after the procedure unless otherwise instructed by your caregiver.  What to expect: Any bruising will usually fade within 1 to 2 weeks.  Blood that collects in the tissue (hematoma) may be painful to the touch. It should usually  decrease in size and tenderness within 1 to 2 weeks.  SEEK IMMEDIATE MEDICAL CARE IF: You have unusual pain at the groin site or down the affected leg.  You have redness, warmth, swelling, or pain at the groin site.  You  have drainage (other than a small amount of blood on the dressing).  You have chills.  You have a fever or persistent symptoms for more than 72 hours.  You have a fever and your symptoms suddenly get worse.  Your leg becomes pale, cool, tingly, or numb.  You have heavy bleeding from the site. Hold pressure on the site. .   Increase activity slowly   Complete by: As directed         Discharge Medications   Allergies as of 02/01/2023       Reactions   Atorvastatin    numbness in his fingertips   Augmentin [amoxicillin-pot Clavulanate] Other (See Comments)   Pt unsure of Allergy    Rosuvastatin Other (See Comments)   numbness in his fingertips        Medication List     STOP taking these medications    hydrochlorothiazide 12.5 MG capsule Commonly known as: MICROZIDE       TAKE these medications    acetaminophen 500 MG tablet Commonly known as: TYLENOL Take 500 mg by mouth as needed for moderate pain.   amLODipine 5 MG tablet Commonly known as: NORVASC TAKE 1 TABLET BY MOUTH EVERY DAY IN THE EVENING What changed:  how much to take how to take this when to take this additional instructions   aspirin EC 81 MG tablet Take 81 mg by mouth daily.   cholecalciferol 1000 units tablet Commonly known as: VITAMIN D Take 1,000 Units by mouth daily.   clopidogrel 75 MG tablet Commonly known as: PLAVIX Take 75 mg by mouth daily.   ezetimibe 10 MG tablet Commonly known as: ZETIA Take 10 mg by mouth every evening.   fish oil-omega-3 fatty acids 1000 MG capsule Take 1 g by mouth daily.   isosorbide mononitrate 30 MG 24 hr tablet Commonly known as: IMDUR Take 1.5 tablets (45 mg total) by mouth every morning AND 1 tablet (30 mg total) every  evening.   losartan 100 MG tablet Commonly known as: COZAAR Take 0.5 tablets (50 mg total) by mouth daily.   metoprolol tartrate 25 MG tablet Commonly known as: LOPRESSOR Take 25 mg by mouth 2 (two) times daily.   nitroGLYCERIN 0.4 MG SL tablet Commonly known as: NITROSTAT PLACE 1 TABLET UNDER THE TONGUE EVERY 5 MINUTES AS NEEDED FOR CHEST PAIN.   pravastatin 80 MG tablet Commonly known as: PRAVACHOL Take 80 mg by mouth at bedtime.   ranolazine 1000 MG SR tablet Commonly known as: RANEXA Take 1 tablet (1,000 mg total) by mouth 2 (two) times daily.        Outstanding Labs/Studies   N/a   Duration of Discharge Encounter   Greater than 30 minutes including physician time.  Signed, Laverda Page, NP 02/01/2023, 12:19 PM   ATTENDING ATTESTATION:  After conducting a review of all available clinical information with the care team, interviewing the patient, and performing a physical exam, I agree with the findings and plan described in this note.   GEN: No acute distress.   HEENT:  MMM, no JVD, no scleral icterus Cardiac: RRR, no murmurs, rubs, or gallops.  Respiratory: Clear to auscultation bilaterally. GI: Soft, nontender, non-distended  MS: No edema; No deformity. Neuro:  Nonfocal  Vasc:  +2 radial pulses; mild bruising RFA  Patient doing well after unsuccessful CTO PCI due to inability to cross the lesion.  This is now his third attempt at revascularization of this right coronary artery CTO.  I did discuss  with the patient and his wife that medical management should be pursued at this time.  The vessel is too small for bypass.  Discharge today with close cardiology follow-up.   Alverda Skeans, MD Pager [phone]

## 2023-02-08 ENCOUNTER — Ambulatory Visit: Payer: 59 | Admitting: Student

## 2023-02-08 ENCOUNTER — Encounter: Payer: Self-pay | Admitting: Student

## 2023-02-08 VITALS — BP 126/68 | HR 60 | Ht 73.0 in | Wt 297.4 lb

## 2023-02-08 DIAGNOSIS — E785 Hyperlipidemia, unspecified: Secondary | ICD-10-CM

## 2023-02-08 DIAGNOSIS — I10 Essential (primary) hypertension: Secondary | ICD-10-CM

## 2023-02-08 DIAGNOSIS — I25119 Atherosclerotic heart disease of native coronary artery with unspecified angina pectoris: Secondary | ICD-10-CM

## 2023-02-08 NOTE — Patient Instructions (Signed)
Medication Instructions:  You may take the isosorbide mononitrate 30mg  in the morning and 30mg  in the afternoon. You may increase this dose to 45mg  by taking a tablet and a half in the morning and again in the afternoon as needed.  *If you need a refill on your cardiac medications before your next appointment, please call your pharmacy*   Lab Work: None  If you have labs (blood work) drawn today and your tests are completely normal, you will receive your results only by: MyChart Message (if you have MyChart) OR A paper copy in the mail If you have any lab test that is abnormal or we need to change your treatment, we will call you to review the results.   Testing/Procedures: none   Follow-Up: At [facility], you and your health needs are our priority.  As part of our continuing mission to provide you with exceptional heart care, we have created designated Provider Care Teams.  These Care Teams include your primary Cardiologist (physician) and Advanced Practice Providers (APPs -  Physician Assistants and Nurse Practitioners) who all work together to provide you with the care you need, when you need it.  We recommend signing up for the patient portal called "MyChart".  Sign up information is provided on this After Visit Summary.  MyChart is used to connect with patients for Virtual Visits (Telemedicine).  Patients are able to view lab/test results, encounter notes, upcoming appointments, etc.  Non-urgent messages can be sent to your provider as well.   To learn more about what you can do with MyChart, go to [email or web address].    Your next appointment:   3-4 month(s)   Provider:   Thurmon Fair, MD

## 2023-02-08 NOTE — Progress Notes (Signed)
Cardiology Clinic Note   Date: 02/08/2023 ID: Dewain, Fogg [birth year], MRN [id number]  Primary Cardiologist:  Thurmon Fair, MD  Patient Profile    Craig Copeland is a 67 y.o. male who presents to the clinic today for hospital follow up.     Past medical history significant for: CAD. LHC 11/13/2002 (inferior MI): PTCA and overlapping stent x 2 mid to proximal RCA. LHC 12/02/2002 entheses chest pain): Patent RCA stents with <20% restenosis. LHC 09/05/2016 (stable angina): Proximal to distal RCA 20%.  Ostial ramus 70%.  Ostial D1-D1 20%.  Mid to distal LAD 20%.  Proximal LAD 20%.  Minimal stent restenosis proximal to mid RCA. Echo 12/31/2022 performed at [facility]: EF 60 to 65%.  Mild asymmetric LVH.  Normal wall motion.  Mild LAE. LHC 12/31/2022 (positive stress test) performed at [facility]: Mid RCA 99 to 100%.  Edge restenosis of previously placed stents.  Unsuccessful PCI due to heavy calcification, tortuosity and poor radial guide support.  Plan to attempt PCI with femoral access. LHC 01/01/2023 (staged intervention) performed at [facility]: Unsuccessful PCI to RCA.  Plan for aggressive medical therapy. LHC 01/31/2023 (staged CTO intervention): Ostial proximal RCA 25%.  Proximal to mid RCA 99%.  Unsuccessful CTO PCI of the mid RCA.  DES to proximal RCA successfully placed to cover guide induced vessel dissection.  Recommendation to continue medical therapy.  We need a minimum of 8 weeks for vessel to heal but given multiple attempts to cross unsuccessfully believe reattempt of PCI is unlikely to be successful. Hypertension. Hyperlipidemia. Lipid panel 01/31/2023: LDL 61, HDL 39, TG 81, total 116. LPa 01/31/2023: <8.4. OSA.     History of Present Illness    Craig Copeland is a longtime patient of cardiology.  He is followed by Dr. Royann Shivers for the above outlined history.  He was last seen in the office by Dr. Royann Shivers on 08/08/2022 for routine follow-up.  He was doing well  at that time and no changes were made.  On 12/28/2022 he presented to [facility] health ED for dizziness and chest tightness.  Patient reported the day prior he had been going up and down a ladder a few times at work when he developed dizziness, headache and visual disturbance with chest tightness.  He rested at home and return to work the next day with similar symptoms.  Symptoms are somewhat relieved with SL NTG.  EKG was negative for ischemic changes.  Troponin negative.  He had a positive stress test during hospital admission.  LHC showed 99 to 100% mid RCA stenosis.  Attempted PCI was unsuccessful and decision was made to reattempt the following day utilizing femoral access.  Unfortunately second attempt was also unsuccessful.  He was started on metoprolol and Ranexa in addition to Imdur.  At the time of his visit he complained of ongoing chest pressure described as constant 1/10 pain that increases with exertion.  He also reported fatigue and dizziness and DOE.  The day prior to procedure patient presented to the ED (01/30/2023) for chest pain and shortness of breath that temporarily responded to NTG x 4. He was admitted. Troponin negative x 2.  Patient underwent staged CTO intervention by Dr. Excell Seltzer that was unsuccessful.  He did have a DES placed to proximal RCA for guide induced vessel dissection.  It is noted that vessel would need a minimum of 8 weeks to heal however given multiple attempts to cross unsuccessfully it was believed reattempt of PCI is  likely to be unsuccessful.  Today, patient is accompanied by his wife. He continues to have episodes of chest tightness. He can only walk short distances before the tightness starts. Worst episode of tightness was last night when he walked the longest distance ("which wasn't very long at all") since discharge from hospital. He also feels very fatigued. 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 He experienced some fatigue, shortness of  breath and tightness but "if I got tired I would sit for a few minutes and could go right back to it." His wife wonders if Metoprolol is causing him to feel so fatigued. He reports right femoral cath site is well healed without drainage but still has extensive bruising. He reports in the hospital when the sheath was pulled they held manual pressure for 90 minutes. Went over all medications. He feels he had better control of his chest tightness when he was taking the second dose of isosorbide in the afternoon versus at nighttime but he was told by the doctor at [facility] to take a dose in the morning and evening.     ROS: All other systems reviewed and are otherwise negative except as noted in History of Present Illness.  Studies Reviewed    EKG is not ordered today.          Physical Exam    VS:  BP 126/68   Pulse 60   Ht 6\' 1"  (1.854 m)   Wt 297 lb 6.4 oz (134.9 kg)   SpO2 98%   BMI 39.24 kg/m  , BMI Body mass index is 39.24 kg/m.  GEN: Well nourished, well developed, in no acute distress. Neck: No JVD or carotid bruits. Cardiac:  RRR. No murmurs. No rubs or gallops.   Respiratory:  Respirations regular and unlabored. Clear to auscultation without rales, wheezing or rhonchi. GI: Soft, nontender, nondistended. Extremities: Radials/DP/PT 2+ and equal bilaterally. No clubbing or cyanosis. No edema.  Skin: Warm and dry, no rash. Neuro: Strength intact.  Assessment & Plan    CAD.  S/p PCI with overlapping stents x 2 to mid RCA May 2004.  LHC March 2018 showed patent stents.  LHC performed at [facility] health showed 99 to 100% mid RCA stenosis.  PCI attempts x 2 at [facility] health unsuccessful.  CTO intervention by Dr. Excell Seltzer was also unsuccessful.  Patient did have DES placed to proximal RCA secondary to guide induced dissection. Patient continues to have chest tightness that has been a chronic issue but has been worse since [facility] hospital admission in July. Tightness occurs with short  distances. He is also very fatigued. He feels he had better control of chest tightness when he was taking a dose of isosorbide in the morning and afternoon. The [facility] physician changed it to a dose in the morning and evening. Patient is instructed to take 30 mg in the morning and 30 mg in the afternoon. If this provides only some relief from the tightness he can increase the dose to 45 mg bid. If he continues to feel fatigued would consider change metoprolol tartrate to Metoprolol Succinate dosed at night.  Continue aspirin, Plavix, amlodipine, metoprolol, Ranexa, Pravachol, Zetia, as needed SL NTG. Hypertension: BP today 126/68. Patient denies headaches, dizziness or vision changes. Continue amlodipine, isosorbide, losartan, metoprolol. Hyperlipidemia.  LDL August 2024 61, at goal.  Continue Pravachol and Zetia.  Disposition: Return in 3 months or sooner as needed.     Cardiac Rehabilitation Eligibility Assessment  The patient  is NOT ready to start cardiac rehabilitation due to: Other (Defer for medication optimizaiton.)        Signed, Etta Grandchild. Cassidy Tashiro, DNP, NP-C

## 2023-02-12 ENCOUNTER — Emergency Department (HOSPITAL_BASED_OUTPATIENT_CLINIC_OR_DEPARTMENT_OTHER)
Admission: EM | Admit: 2023-02-12 | Discharge: 2023-02-12 | Disposition: A | Discharge status: Home / Self Care | Payer: 59 | Attending: Emergency Medicine | Admitting: Emergency Medicine

## 2023-02-12 ENCOUNTER — Telehealth: Payer: Self-pay | Admitting: Cardiovascular Disease

## 2023-02-12 ENCOUNTER — Emergency Department (HOSPITAL_BASED_OUTPATIENT_CLINIC_OR_DEPARTMENT_OTHER): Payer: 59

## 2023-02-12 ENCOUNTER — Other Ambulatory Visit: Payer: Self-pay

## 2023-02-12 DIAGNOSIS — I2510 Atherosclerotic heart disease of native coronary artery without angina pectoris: Secondary | ICD-10-CM | POA: Diagnosis not present

## 2023-02-12 DIAGNOSIS — Z79899 Other long term (current) drug therapy: Secondary | ICD-10-CM | POA: Diagnosis not present

## 2023-02-12 DIAGNOSIS — Z7902 Long term (current) use of antithrombotics/antiplatelets: Secondary | ICD-10-CM | POA: Diagnosis not present

## 2023-02-12 DIAGNOSIS — Z7982 Long term (current) use of aspirin: Secondary | ICD-10-CM | POA: Insufficient documentation

## 2023-02-12 DIAGNOSIS — R0789 Other chest pain: Secondary | ICD-10-CM | POA: Diagnosis present

## 2023-02-12 DIAGNOSIS — U071 COVID-19: Secondary | ICD-10-CM | POA: Insufficient documentation

## 2023-02-12 MED ORDER — ACETAMINOPHEN 500 MG PO TABS
1000.0000 mg | ORAL_TABLET | Freq: Once | ORAL | Status: AC
  Administered 2023-02-12: 1000 mg via ORAL
  Filled 2023-02-12: qty 2

## 2023-02-12 NOTE — Telephone Encounter (Signed)
Pt spouse called in stating pt has covid and experiencing a fever, cough, and mild cp. She wants to know if Dr. Royann Shivers can prescribe something. Please advise.

## 2023-02-12 NOTE — ED Notes (Signed)
 RN reviewed discharge instructions with pt. Pt verbalized understanding and had no further questions. VSS upon discharge.  

## 2023-02-12 NOTE — ED Triage Notes (Signed)
Pt presents to the er with a positive at home covid test. Pt reports coughing, sob, chest pain, headache, and congestion that started last night.

## 2023-02-12 NOTE — ED Provider Notes (Signed)
[place] EMERGENCY DEPARTMENT AT [facility] Provider Note   CSN: [id number] Arrival date & time: 02/12/23  1448     History  No chief complaint on file.   Craig Copeland is a 67 y.o. male.  Patient is a 67 year old male with a history of CAD, recent catheterizations showing restenosis of prior stents 20 years ago that have been difficult to manage by recurrent catheterizations so patient has to take nitroglycerin fairly frequently with exertion to relieve his shortness of breath and chest pressure who is presenting today with a 2-day history of fever, cough, chills, shortness of breath.  Patient reports that today he tested positive for COVID.  [place] and had dinner on Friday with family who tested positive shortly after they had been with them.  He reports that on Sunday he felt just a mild cough and scratchy throat but yesterday he felt very badly with severe coughing and difficulty sleeping.  He reports that today the cough has not been quite as bad but he still is having chills shakes, fever and feeling constantly short of breath.  No vomiting or diarrhea.  He called his doctor who recommended he come to the emergency room to have an x-ray done.  The history is provided by the patient, the spouse and medical records.       Home Medications Prior to Admission medications   Medication Sig Start Date End Date Taking? Authorizing Provider  acetaminophen (TYLENOL) 500 MG tablet Take 500 mg by mouth as needed for moderate pain.    [provider]  amLODipine (NORVASC) 5 MG tablet TAKE 1 TABLET BY MOUTH EVERY DAY IN THE EVENING 02/01/23   Laverda Page B, NP  aspirin EC 81 MG tablet Take 81 mg by mouth daily.    [provider]  cholecalciferol (VITAMIN D) 1000 units tablet Take 1,000 Units by mouth daily.    [provider]  clopidogrel (PLAVIX) 75 MG tablet Take 75 mg by mouth daily.    [provider]  ezetimibe (ZETIA) 10 MG tablet Take 10 mg by mouth every evening.     [provider]  fish oil-omega-3 fatty acids 1000 MG capsule Take 1 g by mouth daily.     [provider]  isosorbide mononitrate (IMDUR) 30 MG 24 hr tablet Take 1.5 tablets (45 mg total) by mouth every morning AND 1 tablet (30 mg total) every evening. 01/09/23   Reather Littler D, NP  losartan (COZAAR) 100 MG tablet Take 0.5 tablets (50 mg total) by mouth daily. 12/22/12   Croitoru, Mihai, MD  metoprolol tartrate (LOPRESSOR) 25 MG tablet Take 25 mg by mouth 2 (two) times daily. 01/01/23   [provider]  nitroGLYCERIN (NITROSTAT) 0.4 MG SL tablet PLACE 1 TABLET UNDER THE TONGUE EVERY 5 MINUTES AS NEEDED FOR CHEST PAIN. 05/01/22   Croitoru, Mihai, MD  pravastatin (PRAVACHOL) 80 MG tablet Take 80 mg by mouth at bedtime.     [provider]  ranolazine (RANEXA) 1000 MG SR tablet Take 1 tablet (1,000 mg total) by mouth 2 (two) times daily. 02/01/23 03/03/23  Arty Baumgartner, NP      Allergies    Atorvastatin, Augmentin [amoxicillin-pot clavulanate], and Rosuvastatin    Review of Systems   Review of Systems  Physical Exam Updated Vital Signs BP 124/73   Pulse 69   Temp (!) 101.2 F (38.4 C)   Resp (!) 24   SpO2  94%  Physical Exam Vitals and nursing note reviewed.  Constitutional:      General: He is not in acute distress.    Appearance: He is well-developed.  HENT:     Head: Normocephalic and atraumatic.     Right Ear: Tympanic membrane normal.     Left Ear: Tympanic membrane normal.     Nose: Rhinorrhea present.     Mouth/Throat:     Mouth: Mucous membranes are moist.  Eyes:     Conjunctiva/sclera: Conjunctivae normal.     Pupils: Pupils are equal, round, and reactive to light.  Cardiovascular:     Rate and Rhythm: Normal rate and regular rhythm.     Pulses: Normal pulses.     Heart sounds: Murmur heard.  Pulmonary:     Effort: Pulmonary effort is normal. No respiratory  distress.     Breath sounds: Normal breath sounds. No wheezing or rales.  Abdominal:     General: There is no distension.     Palpations: Abdomen is soft.     Tenderness: There is no abdominal tenderness. There is no guarding or rebound.  Musculoskeletal:        General: No tenderness. Normal range of motion.     Cervical back: Normal range of motion and neck supple.     Right lower leg: No edema.     Left lower leg: No edema.  Skin:    General: Skin is warm and dry.     Findings: No erythema or rash.  Neurological:     Mental Status: He is alert and oriented to person, place, and time. Mental status is at baseline.  Psychiatric:        Behavior: Behavior normal.     ED Results / Procedures / Treatments   Labs (all labs ordered are listed, but only abnormal results are displayed) Labs Reviewed - No data to display  EKG EKG Interpretation Date/Time:  Tuesday February 12 2023 15:02:23 EDT Ventricular Rate:  74 PR Interval:  202 QRS Duration:  88 QT Interval:  378 QTC Calculation: 419 R Axis:   20  Text Interpretation: Normal sinus rhythm Anterior infarct , age undetermined Baseline wander Confirmed by Gwyneth Sprout ([id number]) on 02/12/2023 3:28:40 PM  Radiology DG Chest Port 1 View  Result Date: 02/12/2023 CLINICAL DATA:  Cough and shortness of breath EXAM: PORTABLE CHEST 1 VIEW COMPARISON:  X-ray 01/30/2023 FINDINGS: Underinflation. No consolidation, pneumothorax or effusion. No edema. Normal cardiopericardial silhouette. Film is under penetrated. Overlapping cardiac leads. IMPRESSION: Underinflation.  No acute cardiopulmonary disease. Electronically Signed   By: Karen Kays M.D.   On: 02/12/2023 16:59    Procedures Procedures    Medications Ordered in ED Medications  acetaminophen (TYLENOL) tablet 1,000 mg (1,000 mg Oral Given 02/12/23 1527)    ED Course/ Medical Decision Making/ A&P                                 Medical Decision Making Amount and/or  Complexity of Data Reviewed Radiology: ordered and independent interpretation performed. Decision-making details documented in ED Course. ECG/medicine tests: ordered and independent interpretation performed. Decision-making details documented in ED Course.  Risk OTC drugs.   Pt with symptoms consistent with viral URI/COVID.  Well appearing here but patient is complaining of feeling short of breath.  He is not tachypneic and sats are greater than 98% on room air even with wearing a mask and while  speaking.  Breath sounds are clear.  Unfortunately patient has history of significant cardiac disease and is on multiple medications for these illnesses that are contraindicated with Paxlovid.  He did test positive at home.  No signs of pharyngitis, otitis or abnormal abdominal findings.  Will get a chest x-ray to evaluate.  I independently interpreted patient's EKG today and it has a wandering baseline but no significant ST changes concerning for ACS.  Patient is febrile upon arrival here and was given Tylenol.  No findings concerning for sepsis.  No wheezing suggestive of bronchitis and he has no respiratory illnesses other than sleep apnea. I have independently visualized and interpreted pt's images today. CXR is a poor inspiratory film but o/w no evidence of PNA today or pulm edema.  Findings discussed with pt and spouse. pt to return with any further problems, supportive care at this time.         Final Clinical Impression(s) / ED Diagnoses Final diagnoses:  COVID    Rx / DC Orders ED Discharge Orders     None         Gwyneth Sprout, MD 02/12/23 1738

## 2023-02-12 NOTE — Discharge Instructions (Signed)
Continue Tylenol every 4-6 hours as needed for fever.  Get plenty of rest and stay hydrated.  Continue your current heart medications.  If you start feeling better and then suddenly come down with a high fever, worsening shortness of breath you need a repeat x-ray.  But your x-ray today was clear.

## 2023-02-12 NOTE — Telephone Encounter (Signed)
Wife aware she will need to reach out to PCP for covid concerns

## 2023-02-21 ENCOUNTER — Telehealth: Payer: Self-pay | Admitting: Cardiovascular Disease

## 2023-02-21 NOTE — Telephone Encounter (Signed)
Pt c/o medication issue:  1. Name of Medication:  ranolazine (RANEXA) 1000 MG SR tablet metoprolol tartrate (LOPRESSOR) 25 MG tablet  2. How are you currently taking this medication (dosage and times per day)?  Patient's wife states patient takes both medications twice daily as prescribed.  3. Are you having a reaction (difficulty breathing--STAT)?   4. What is your medication issue?   Patient's wife states one of these new medications is causing a severe headache every morning about an hour after taking them.

## 2023-02-21 NOTE — Telephone Encounter (Signed)
Returned pt wife call. Wife states that pt gets a headache every morning after he takes them which is about an hour. Wife thinks it is from either the Ranexa or Lopressor. They would like advice on what to do. Pt has no other symptoms.

## 2023-02-21 NOTE — Telephone Encounter (Signed)
Actually, by far the most likely cause of headache would be the isosorbide mononitrate. Try stopping that for 2-3 days to see if it helps.

## 2023-02-21 NOTE — Telephone Encounter (Signed)
Returned call to wife Dewayne Hatch, LVM with Dr. Renaye Rakers recommendation.

## 2023-02-22 NOTE — Telephone Encounter (Signed)
Left message repeating the directions. Asked her to call back with confirmation and/or with any questions.

## 2023-04-05 ENCOUNTER — Other Ambulatory Visit: Payer: Self-pay | Admitting: Cardiovascular Disease

## 2023-06-04 DIAGNOSIS — I25118 Atherosclerotic heart disease of native coronary artery with other forms of angina pectoris: Secondary | ICD-10-CM | POA: Diagnosis not present

## 2023-06-04 DIAGNOSIS — I252 Old myocardial infarction: Secondary | ICD-10-CM | POA: Diagnosis not present

## 2023-06-18 ENCOUNTER — Ambulatory Visit: Payer: Medicare Other | Attending: Cardiovascular Disease | Admitting: Cardiovascular Disease

## 2023-06-18 ENCOUNTER — Encounter: Payer: Self-pay | Admitting: Cardiovascular Disease

## 2023-06-18 ENCOUNTER — Telehealth: Payer: Self-pay | Admitting: Emergency Medicine

## 2023-06-18 VITALS — BP 122/68 | HR 70 | Ht 73.0 in | Wt 310.0 lb

## 2023-06-18 DIAGNOSIS — E785 Hyperlipidemia, unspecified: Secondary | ICD-10-CM

## 2023-06-18 DIAGNOSIS — G4733 Obstructive sleep apnea (adult) (pediatric): Secondary | ICD-10-CM | POA: Diagnosis not present

## 2023-06-18 DIAGNOSIS — I7121 Aneurysm of the ascending aorta, without rupture: Secondary | ICD-10-CM

## 2023-06-18 DIAGNOSIS — I25118 Atherosclerotic heart disease of native coronary artery with other forms of angina pectoris: Secondary | ICD-10-CM

## 2023-06-18 DIAGNOSIS — I10 Essential (primary) hypertension: Secondary | ICD-10-CM

## 2023-06-18 MED ORDER — ISOSORBIDE MONONITRATE ER 30 MG PO TB24: 30.0000 mg | ORAL_TABLET | Freq: Every day | ORAL | 3 refills | Status: DC

## 2023-06-18 MED ORDER — CARVEDILOL 6.25 MG PO TABS: 6.2500 mg | ORAL_TABLET | Freq: Two times a day (BID) | ORAL | 3 refills | Status: DC

## 2023-06-18 NOTE — Telephone Encounter (Signed)
Left message- Informed the patient that I forgot to put on his After Visit Summary that he needs a 6 week follow up appointment with one of our APPs, then see Dr C in 6 months. Asked him to call back to schedule this appt. Left call back number.

## 2023-06-18 NOTE — Progress Notes (Signed)
Patient ID: TREQUAN WALMSLEY, male   DOB: [birth year], 67 y.o.   MRN: [id number]      Cardiology Office Note   Date:  06/21/2023   ID:  Blair, Brull 04-Jun-1956, MRN [id number]  PCP:  [name], MD  Cardiologist:   Thurmon Fair, MD   No chief complaint on file.    History of Present Illness: ALFREDA FAGIN is a 67 y.o. male who presents for follow-up for coronary artery disease, hypertension and hyperlipidemia.  In July he presented to [facility] with nitrate responsive angina, but without elevated troponin or ischemic ECG changes.  He underwent cardiac catheterization in was found to have a high-grade stenosis of the right coronary artery.  He had an unsuccessful attempt at revascularization on 01/01/2023.  On 01/31/2023 chronic total occlusion team try to open up his subtotal occlusion of the proximal-mid right coronary artery stenosis at the level of 2 previously placed stents.  They were not able to cross the lesion with a balloon.  They did stent the proximal right coronary artery after a guide provoked proximal vessel dissection.  He is having some angina, and a pattern consistent with stable angina.  He can work for about an hour before develops and it resolves if he rests for just a few minutes.  He feels mild chest tightness if he climbs a flight of stairs without stopping. 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  Denies any chest pain or dyspnea at rest.  His edema is well-controlled and he rarely has symptoms of orthostatic dizziness.  He denies palpitations or syncope.  He is compliant with CPAP and denies daytime hypersomnolence.    Despite trying to eat a vegan diet together, he remains a morbidly obese range with a BMI of almost 41.  He did not tolerate treatment with GLP-1 agonist due to the GI side effects.  Most recent lipid profile showed an excellent LDL of only 51 but also with a chronically low HDL at 38.  Triglycerides are normal and the  hemoglobin A1c is borderline at 5.7%.  He has normal renal function.  He last underwent cardiac catheterization on September 05, 2016. The previously placed stents in his right coronary artery were widely patent and the only significant stenosis was an ostial 70% lesion in a relatively small caliber ramus intermedius artery. No revascularization was performed.  He seems to be tolerating pravastatin without problems. He was not tolerant of either rosuvastatin or atorvastatin.    He has a history of previous stent to the right coronary artery(2004, drug-eluting 3.0 x 33 mm Cypher stent overlapping 3.0 x 23 mm Cypher stent in the mid to proximal right coronary artery) and a moderate to severe ostial lesion of a small to medium ramus intermedius artery (too small for percutaneous revascularization). No change on cardiac cath performed in 2018. 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            His last cardiac catheterization was in March 2018, that showed findings identical to 2014, 2011 and 2004. He has normal left ventricle systolic function by echo most recently performed in October 2016; EF 50-55% by LV angiography in March 2018.  History of hypertension and hyperlipidemia. He is morbidly obese. He is compliant with CPAP for obstructive sleep apnea. He is on chronic treatment with aspirin and clopidogrel.    Past Medical History:  Diagnosis Date   CAD (coronary artery  disease)    Dizziness    Dyslipidemia    Flu 06/2016   Had again 01/208 with PNA   Obesity    OSA (obstructive sleep apnea)    Systemic hypertension    Vasospastic angina [facility])     Past Surgical History:  Procedure Laterality Date   CARDIAC CATHETERIZATION  12/02/2002   <20% restenosis RCA   CARDIAC CATHETERIZATION  01/30/2010   20-30% in-stent restenosis,60-70% ostial stenosis   CORONARY ANGIOPLASTY WITH STENT PLACEMENT  11/13/2002   mid RCA   CORONARY  ATHERECTOMY N/A 01/31/2023   Procedure: CORONARY ATHERECTOMY;  Surgeon: [place]azil[place], Peter M, MD;  Location: [facility] INVASIVE CV LAB;  Service: Cardiovascular;  Laterality: N/A;   CORONARY CTO INTERVENTION N/A 01/31/2023   Procedure: CORONARY CTO INTERVENTION;  Surgeon: [place]azil[place], Peter M, MD;  Location: [facility] INVASIVE CV LAB;  Service: Cardiovascular;  Laterality: N/A;   KNEE SURGERY  2003   LEFT HEART CATH AND CORONARY ANGIOGRAPHY N/A 09/05/2016   Procedure: Left Heart Cath and Coronary Angiography;  Surgeon: Kathleene Hazel, MD;  Location: [facility] INVASIVE CV LAB;  Service: Cardiovascular;  Laterality: N/A;   LEFT HEART CATHETERIZATION WITH CORONARY ANGIOGRAM N/A 12/22/2012   Procedure: LEFT HEART CATHETERIZATION WITH CORONARY ANGIOGRAM;  Surgeon: Thurmon Fair, MD;  Location: [facility] CATH LAB;  Service: Cardiovascular;  Laterality: N/A;   NOSE SURGERY     TOTAL KNEE ARTHROPLASTY Left 03/22/2017   Procedure: LEFT TOTAL KNEE ARTHROPLASTY;  Surgeon: Eugenia Mcalpine, MD;  Location: [facility] ORS;  Service: Orthopedics;  Laterality: Left;   VASECTOMY       Current Outpatient Medications  Medication Sig Dispense Refill   acetaminophen (TYLENOL) 500 MG tablet Take 500 mg by mouth as needed for moderate pain.     amLODipine (NORVASC) 5 MG tablet TAKE 1 TABLET BY MOUTH EVERY DAY IN THE EVENING 90 tablet 3   aspirin EC 81 MG tablet Take 81 mg by mouth daily.     carvedilol (COREG) 6.25 MG tablet Take 1 tablet (6.25 mg total) by mouth 2 (two) times daily. 180 tablet 3   cholecalciferol (VITAMIN D) 1000 units tablet Take 1,000 Units by mouth daily.     clopidogrel (PLAVIX) 75 MG tablet Take 75 mg by mouth daily.     ezetimibe (ZETIA) 10 MG tablet Take 10 mg by mouth every evening.      fish oil-omega-3 fatty acids 1000 MG capsule Take 1 g by mouth daily.      isosorbide mononitrate (IMDUR) 30 MG 24 hr tablet Take 1 tablet (30 mg total) by mouth daily. 90 tablet 3   losartan (COZAAR) 100 MG tablet Take 0.5 tablets (50 mg total) by  mouth daily. 90 tablet 3   nitroGLYCERIN (NITROSTAT) 0.4 MG SL tablet PLACE 1 TABLET UNDER THE TONGUE EVERY 5 MINUTES AS NEEDED FOR CHEST PAIN. 25 tablet 2   pravastatin (PRAVACHOL) 80 MG tablet Take 80 mg by mouth at bedtime.      ranolazine (RANEXA) 1000 MG SR tablet Take 1 tablet (1,000 mg total) by mouth 2 (two) times daily. 60 tablet 0   No current facility-administered medications for this visit.    Allergies:   Atorvastatin, Augmentin [amoxicillin-pot clavulanate], and Rosuvastatin    Social History:  The patient  reports that he has never smoked. He has never used smokeless tobacco. He reports current alcohol use of about 1.0 standard drink of alcohol per week. He reports that he does not use drugs.   Family History:  The  patient's family history includes Alzheimer's disease in his paternal grandfather; Cancer in his paternal grandfather; Heart failure in his father and maternal grandmother; Hypertension in his father; Stroke in his paternal grandmother.    ROS:  Please see the history of present illness.  He denies shortness of breath at rest or with activity, orthopnea, PND, claudication, focal neurological complaints.  All other systems are reviewed and are negative.  PHYSICAL EXAM: VS:  BP 122/68 (BP Location: Left Arm, Patient Position: Sitting, Cuff Size: Large)   Pulse 70   Ht 6\' 1"  (1.854 m)   Wt (!) 310 lb (140.6 kg)   SpO2 97%   BMI 40.90 kg/m  , BMI Body mass index is 40.9 kg/m.   General: Alert, oriented x3, no distress, morbidly obese Head: no evidence of trauma, PERRL, EOMI, no exophtalmos or lid lag, no myxedema, no xanthelasma; normal ears, nose and oropharynx Neck: normal jugular venous pulsations and no hepatojugular reflux; brisk carotid pulses without delay and no carotid bruits Chest: clear to auscultation, no signs of consolidation by percussion or palpation, normal fremitus, symmetrical and full respiratory excursions Cardiovascular: normal position  and quality of the apical impulse, regular rhythm, normal first and second heart sounds, no murmurs, rubs or gallops Abdomen: no tenderness or distention, no masses by palpation, no abnormal pulsatility or arterial bruits, normal bowel sounds, no hepatosplenomegaly Extremities: no clubbing, cyanosis or edema; 2+ radial, ulnar and brachial pulses bilaterally; 2+ right femoral, posterior tibial and dorsalis pedis pulses; 2+ left femoral, posterior tibial and dorsalis pedis pulses; no subclavian or femoral bruits Neurological: grossly nonfocal Psych: Normal mood and affect    EKG: Personally reviewed the tracing from 02/12/2023 which shows normal sinus rhythm and poor anterior R wave progression  EKG Interpretation Date/Time:    Ventricular Rate:    PR Interval:    QRS Duration:    QT Interval:    QTC Calculation:   R Axis:      Text Interpretation:          Recent Labs: 10/24/2020 Creatinine 0.94, ALT 54, glucose 114 Lipid Panel     Component Value Date/Time   CHOL 116 01/31/2023 0253   TRIG 81 01/31/2023 0253   HDL 39 (L) 01/31/2023 0253   CHOLHDL 3.0 01/31/2023 0253   VLDL 16 01/31/2023 0253   LDLCALC 61 01/31/2023 0253    February 20, 2018 Total cholesterol 117, HDL 32, triglycerides 108, calculated LDL 67.  9 08 2020 Total cholesterol 119, HDL 34, triglycerides 72 Creatinine 0.7, normal liver function test  04/26/2021 Cholesterol 122, HDL 33, LDL 70, triglycerides 93  56/21/3086 Cholesterol 110, HDL 38, LDL 51, triglycerides 114, hemoglobin A1c 5.7%  Wt Readings from Last 3 Encounters:  06/18/23 (!) 310 lb (140.6 kg)  02/08/23 297 lb 6.4 oz (134.9 kg)  01/31/23 292 lb 5.3 oz (132.6 kg)     ASSESSMENT AND PLAN:  1. Coronary artery disease of native artery of native heart with stable angina pectoris (HCC)   2. Dyslipidemia (high LDL; low HDL)   3. Essential hypertension   4. Morbid obesity (HCC)   5. OSA on CPAP   6. Aneurysm of ascending aorta without  rupture (HCC)      1. CAD s/p PCI RCA: He has had 2 unsuccessful attempts at revascularization of the right coronary artery including by our CTO team.  He could undergo surgical revascularization, but this would be a rather radical approach for what is a low risk for coronary situation.  He does have bridging collaterals to the distal vessel.  He does not have significant stenoses in the left coronary system.  Will keep trying to titrate his antianginal medications.  He has an excellent lipid profile.  He should continue on dual antiplatelet therapy at least through early February (6 months since the placement of a stent in his proximal right coronary artery).  Good symptomatic response to twice daily long-acting nitrates added to his amlodipine.  This seems to confirm the suspicion that he has some degree of coronary vasospasm.  He is not requiring any sublingual nitroglycerin anymore.  He is able to be physically active. He did not tolerate higher beta-blocker doses be due to bradycardia.  Continue lipid-lowering therapy dual antiplatelet therapy. 2. Hyperlipidemia.  Other than the low HDL cholesterol, which will only improve with substantial weight loss and more physical activity, his lipid profile is excellent on the current combination of pravastatin and ezetimibe.  Continue. 3. Hypertension: His blood pressure is very well-controlled.  There is a little room for [place] to enhance his antianginals.  He does have occasional mild symptoms of orthostatic hypotension. 4. Morbid obesity: He has made just a little progress with weight loss despite changing his diet radically.  Unfortunately he was intolerant of GLP-1 agonist due to GI side effects. 5. OSA: He reports 100% percent compliance with CPAP.  He does not have daytime hypersomnolence. 6. Aortic sclerosis/AS:   Murmur is unchanged and not impressive.  Echo in 2016 did not show any increased gradients across the aortic valve.  Reviewed the symptoms of  aortic stenosis (exertional dyspnea, worsening exertional angina, exertional syncope).  If these occur he should call us and we will repeat his echo.  Current medicines are reviewed at length with the patient today.   Labs/ tests ordered today include:   Orders Placed This Encounter  Procedures   AMB referral to cardiac rehabilitation     Patient Instructions  Medication Instructions:  STOP Metoprolol  START CARVEDILOL 6.25 MG TWICE A DAY START ISOSORBIDE MONONITRATE 30 MG DAILY *If you need a refill on your cardiac medications before your next appointment, please call your pharmacy*  Follow-Up: At [facility], you and your health needs are our priority.  As part of our continuing mission to provide you with exceptional heart care, we have created designated Provider Care Teams.  These Care Teams include your primary Cardiologist (physician) and Advanced Practice Providers (APPs -  Physician Assistants and Nurse Practitioners) who all work together to provide you with the care you need, when you need it.  We recommend signing up for the patient portal called "MyChart".  Sign up information is provided on this After Visit Summary.  MyChart is used to connect with patients for Virtual Visits (Telemedicine).  Patients are able to view lab/test results, encounter notes, upcoming appointments, etc.  Non-urgent messages can be sent to your provider as well.   To learn more about what you can do with MyChart, go to [email or web address].    Your next appointment:   6 weeks with an APP   6 month(s) with Thurmon Fair, MD              Signed, Thurmon Fair, MD  06/21/2023 1:49 PM    Thurmon Fair, MD, [facility] [phone] office [phone] pager

## 2023-06-18 NOTE — Patient Instructions (Addendum)
Medication Instructions:  STOP Metoprolol  START CARVEDILOL 6.25 MG TWICE A DAY START ISOSORBIDE MONONITRATE 30 MG DAILY *If you need a refill on your cardiac medications before your next appointment, please call your pharmacy*  Follow-Up: At [facility], you and your health needs are our priority.  As part of our continuing mission to provide you with exceptional heart care, we have created designated Provider Care Teams.  These Care Teams include your primary Cardiologist (physician) and Advanced Practice Providers (APPs -  Physician Assistants and Nurse Practitioners) who all work together to provide you with the care you need, when you need it.  We recommend signing up for the patient portal called "MyChart".  Sign up information is provided on this After Visit Summary.  MyChart is used to connect with patients for Virtual Visits (Telemedicine).  Patients are able to view lab/test results, encounter notes, upcoming appointments, etc.  Non-urgent messages can be sent to your provider as well.   To learn more about what you can do with MyChart, go to [email or web address].    Your next appointment:   6 weeks with an APP   6 month(s) with Thurmon Fair, MD

## 2023-06-19 DIAGNOSIS — Z961 Presence of intraocular lens: Secondary | ICD-10-CM | POA: Diagnosis not present

## 2023-06-19 DIAGNOSIS — H2511 Age-related nuclear cataract, right eye: Secondary | ICD-10-CM | POA: Diagnosis not present

## 2023-06-27 NOTE — Telephone Encounter (Signed)
Left message on pt's voicemail informing that we need to make an appointment with an APP- for 6 week follow up from his visit with Dr Royann Shivers (06/18/23)= 07/30/23. Left call back number.  Called his spouse, Dewayne Hatch (dpr) and asked her to pass this message to him as well. She states that she will let him know.

## 2023-07-04 ENCOUNTER — Encounter (HOSPITAL_COMMUNITY)
Admission: RE | Admit: 2023-07-04 | Discharge: 2023-07-04 | Disposition: A | Discharge status: Home / Self Care | Payer: Medicare Other | Source: Ambulatory Visit | Attending: Cardiovascular Disease | Admitting: Cardiovascular Disease

## 2023-07-04 VITALS — Ht 71.5 in | Wt 311.4 lb

## 2023-07-04 DIAGNOSIS — Z955 Presence of coronary angioplasty implant and graft: Secondary | ICD-10-CM | POA: Insufficient documentation

## 2023-07-04 NOTE — Progress Notes (Signed)
 Cardiac Individual Treatment Plan  Patient Details  Name: Craig Copeland MRN: [id number] Date of Birth: Mar 17, 1956 Referring Provider:   Flowsheet Row CARDIAC REHAB PHASE II ORIENTATION from 07/04/2023 in [facility] CARDIAC REHABILITATION  Referring Provider Croitoru, Jerel MD       Initial Encounter Date:  Flowsheet Row CARDIAC REHAB PHASE II ORIENTATION from 07/04/2023 in [facility] CARDIAC REHABILITATION  Date 07/04/23       Visit Diagnosis: Status post coronary artery stent placement  Patient's Home Medications on Admission:  Current Outpatient Medications:    amLODipine  (NORVASC ) 5 MG tablet, TAKE 1 TABLET BY MOUTH EVERY DAY IN THE EVENING, Disp: 90 tablet, Rfl: 3   aspirin  EC 81 MG tablet, Take 81 mg by mouth daily., Disp: , Rfl:    carvedilol  (COREG ) 6.25 MG tablet, Take 1 tablet (6.25 mg total) by mouth 2 (two) times daily., Disp: 180 tablet, Rfl: 3   cholecalciferol  (VITAMIN D ) 1000 units tablet, Take 1,000 Units by mouth daily., Disp: , Rfl:    clopidogrel  (PLAVIX ) 75 MG tablet, Take 75 mg by mouth daily., Disp: , Rfl:    ezetimibe  (ZETIA ) 10 MG tablet, Take 10 mg by mouth every evening. , Disp: , Rfl:    fish oil-omega-3 fatty acids 1000 MG capsule, Take 1 g by mouth daily. , Disp: , Rfl:    isosorbide  mononitrate (IMDUR ) 30 MG 24 hr tablet, Take 1 tablet (30 mg total) by mouth daily., Disp: 90 tablet, Rfl: 3   losartan  (COZAAR ) 100 MG tablet, Take 0.5 tablets (50 mg total) by mouth daily., Disp: 90 tablet, Rfl: 3   nitroGLYCERIN  (NITROSTAT ) 0.4 MG SL tablet, PLACE 1 TABLET UNDER THE TONGUE EVERY 5 MINUTES AS NEEDED FOR CHEST PAIN., Disp: 25 tablet, Rfl: 2   pravastatin  (PRAVACHOL ) 80 MG tablet, Take 80 mg by mouth at bedtime. , Disp: , Rfl:    ranolazine  (RANEXA ) 1000 MG SR tablet, Take 1 tablet (1,000 mg total) by mouth 2 (two) times daily., Disp: 60 tablet, Rfl: 0   acetaminophen  (TYLENOL ) 500 MG tablet, Take 500 mg by mouth as needed for moderate pain., Disp: , Rfl:     WEGOVY 0.25 MG/0.5ML SOAJ, SMARTSIG:0.25 Milligram(s) SUB-Q Every 4 Weeks (Patient not taking: Reported on 07/04/2023), Disp: , Rfl:   Past Medical History: Past Medical History:  Diagnosis Date   CAD (coronary artery disease)    Dizziness    Dyslipidemia    Flu 06/2016   Had again 01/208 with PNA   Obesity    OSA (obstructive sleep apnea)    Systemic hypertension    Vasospastic angina (HCC)     Tobacco Use: Social History   Tobacco Use  Smoking Status Never  Smokeless Tobacco Never    Labs: Review Flowsheet       Latest Ref Rng & Units 12/24/2013 04/08/2015 01/31/2023  Labs for ITP Cardiac and Pulmonary Rehab  Cholestrol 0 - 200 mg/dL - 862  883   LDL (calc) 0 - 99 mg/dL - 74  61   HDL-C >59 mg/dL - 32  39   Trlycerides <150 mg/dL - 842  81   Hemoglobin A1c 4.8 - 5.6 % 5.6  - -    Capillary Blood Glucose: Lab Results  Component Value Date   GLUCAP 188 (H) 01/31/2023     Exercise Target Goals: Exercise Program Goal: Individual exercise prescription set using results from initial 6 min walk test and THRR while considering  patient's activity barriers and safety.  Exercise Prescription Goal: Starting with aerobic activity 30 plus minutes a day, 3 days per week for initial exercise prescription. Provide home exercise prescription and guidelines that participant acknowledges understanding prior to discharge.  Activity Barriers & Risk Stratification:  Activity Barriers & Cardiac Risk Stratification - 07/04/23 1334       Activity Barriers & Cardiac Risk Stratification   Activity Barriers Left Knee Replacement;Deconditioning;Muscular Weakness;Balance Concerns;Joint Problems;History of Falls;Shortness of Breath   needs R hip, three tears in rotator cuff R side   Cardiac Risk Stratification Moderate             6 Minute Walk:  6 Minute Walk     Row Name 07/04/23 1518         6 Minute Walk   Phase Initial     Distance 1330 feet     Walk Time 6 minutes      # of Rest Breaks 0     MPH 2.52     METS 2.5     RPE 8     VO2 Peak 8.74     Symptoms Yes (comment)     Comments L knee clicking like normal     Resting HR 63 bpm     Resting BP 104/56     Resting Oxygen Saturation  97 %     Exercise Oxygen Saturation  during 6 min walk 97 %     Max Ex. HR 112 bpm     Max Ex. BP 142/74     2 Minute Post BP 120/66              Oxygen Initial Assessment:   Oxygen Re-Evaluation:   Oxygen Discharge (Final Oxygen Re-Evaluation):   Initial Exercise Prescription:  Initial Exercise Prescription - 07/04/23 1500       Date of Initial Exercise RX and Referring Provider   Date 07/04/23    Referring Provider Croitoru, Mihai MD      Oxygen   Maintain Oxygen Saturation 88% or higher      Treadmill   MPH 2.5    Grade 0.5    Minutes 15    METs 3.09      REL-XR   Level 3    Speed 50    Minutes 15    METs 3      Prescription Details   Frequency (times per week) 2    Duration Progress to 30 minutes of continuous aerobic without signs/symptoms of physical distress      Intensity   THRR 40-80% of Max Heartrate 99-137    Ratings of Perceived Exertion 11-13    Perceived Dyspnea 0-4      Progression   Progression Continue to progress workloads to maintain intensity without signs/symptoms of physical distress.      Resistance Training   Training Prescription Yes    Weight 5 lb    Reps 10-15             Perform Capillary Blood Glucose checks as needed.  Exercise Prescription Changes:   Exercise Prescription Changes     Row Name 07/04/23 1500             Response to Exercise   Blood Pressure (Admit) 104/56       Blood Pressure (Exercise) 142/74       Blood Pressure (Exit) 120/66       Heart Rate (Admit) 63 bpm       Heart Rate (Exercise) 112 bpm  Heart Rate (Exit) 59 bpm       Oxygen Saturation (Admit) 97 %       Oxygen Saturation (Exercise) 97 %       Rating of Perceived Exertion (Exercise) 8        Symptoms L knee clicking like normal       Comments walk test results                Exercise Comments:   Exercise Goals and Review:   Exercise Goals     Row Name 07/04/23 1521             Exercise Goals   Increase Physical Activity Yes       Intervention Develop an individualized exercise prescription for aerobic and resistive training based on initial evaluation findings, risk stratification, comorbidities and participant's personal goals.;Provide advice, education, support and counseling about physical activity/exercise needs.       Expected Outcomes Short Term: Attend rehab on a regular basis to increase amount of physical activity.;Long Term: Add in home exercise to make exercise part of routine and to increase amount of physical activity.;Long Term: Exercising regularly at least 3-5 days a week.       Increase Strength and Stamina Yes       Intervention Provide advice, education, support and counseling about physical activity/exercise needs.;Develop an individualized exercise prescription for aerobic and resistive training based on initial evaluation findings, risk stratification, comorbidities and participant's personal goals.       Expected Outcomes Short Term: Increase workloads from initial exercise prescription for resistance, speed, and METs.;Short Term: Perform resistance training exercises routinely during rehab and add in resistance training at home;Long Term: Improve cardiorespiratory fitness, muscular endurance and strength as measured by increased METs and functional capacity ( )       Able to understand and use rate of perceived exertion (RPE) scale Yes       Intervention Provide education and explanation on how to use RPE scale       Expected Outcomes Short Term: Able to use RPE daily in rehab to express subjective intensity level;Long Term:  Able to use RPE to guide intensity level when exercising independently       Able to understand and use Dyspnea scale Yes        Intervention Provide education and explanation on how to use Dyspnea scale       Expected Outcomes Short Term: Able to use Dyspnea scale daily in rehab to express subjective sense of shortness of breath during exertion;Long Term: Able to use Dyspnea scale to guide intensity level when exercising independently       Knowledge and understanding of Target Heart Rate Range (THRR) Yes       Intervention Provide education and explanation of THRR including how the numbers were predicted and where they are located for reference       Expected Outcomes Short Term: Able to state/look up THRR;Short Term: Able to use daily as guideline for intensity in rehab;Long Term: Able to use THRR to govern intensity when exercising independently       Able to check pulse independently Yes       Intervention Provide education and demonstration on how to check pulse in carotid and radial arteries.;Review the importance of being able to check your own pulse for safety during independent exercise       Expected Outcomes Short Term: Able to explain why pulse checking is important during independent exercise;Long Term: Able to  check pulse independently and accurately       Understanding of Exercise Prescription Yes       Intervention Provide education, explanation, and written materials on patient's individual exercise prescription       Expected Outcomes Short Term: Able to explain program exercise prescription;Long Term: Able to explain home exercise prescription to exercise independently                Exercise Goals Re-Evaluation :    Discharge Exercise Prescription (Final Exercise Prescription Changes):  Exercise Prescription Changes - 07/04/23 1500       Response to Exercise   Blood Pressure (Admit) 104/56    Blood Pressure (Exercise) 142/74    Blood Pressure (Exit) 120/66    Heart Rate (Admit) 63 bpm    Heart Rate (Exercise) 112 bpm    Heart Rate (Exit) 59 bpm    Oxygen Saturation (Admit) 97 %     Oxygen Saturation (Exercise) 97 %    Rating of Perceived Exertion (Exercise) 8    Symptoms L knee clicking like normal    Comments walk test results             Nutrition:  Target Goals: Understanding of nutrition guidelines, daily intake of sodium 1500mg , cholesterol 200mg , calories 30% from fat and 7% or less from saturated fats, daily to have 5 or more servings of fruits and vegetables.  Biometrics:  Pre Biometrics - 07/04/23 1522       Pre Biometrics   Height 5' 11.5 (1.816 m)    Weight 141.3 kg    Waist Circumference 49.5 inches    Hip Circumference 45 inches    Waist to Hip Ratio 1.1 %    BMI (Calculated) 42.83    Grip Strength 43.9 kg    Single Leg Stand 30 seconds              Nutrition Therapy Plan and Nutrition Goals:  Nutrition Therapy & Goals - 07/04/23 1533       Intervention Plan   Intervention Prescribe, educate and counsel regarding individualized specific dietary modifications aiming towards targeted core components such as weight, hypertension, lipid management, diabetes, heart failure and other comorbidities.;Nutrition handout(s) given to patient.    Expected Outcomes Short Term Goal: Understand basic principles of dietary content, such as calories, fat, sodium, cholesterol and nutrients.;Long Term Goal: Adherence to prescribed nutrition plan.             Nutrition Assessments:  MEDIFICTS Score Key: >=70 Need to make dietary changes  40-70 Heart Healthy Diet <= 40 Therapeutic Level Cholesterol Diet  Flowsheet Row CARDIAC REHAB PHASE II ORIENTATION from 07/04/2023 in [facility] CARDIAC REHABILITATION  Picture Your Plate Total Score on Admission 50      Picture Your Plate Scores: <59 Unhealthy dietary pattern with much room for improvement. 41-50 Dietary pattern unlikely to meet recommendations for good health and room for improvement. 51-60 More healthful dietary pattern, with some room for improvement.  >60 Healthy dietary pattern,  although there may be some specific behaviors that could be improved.    Nutrition Goals Re-Evaluation:   Nutrition Goals Discharge (Final Nutrition Goals Re-Evaluation):   Psychosocial: Target Goals: Acknowledge presence or absence of significant depression and/or stress, maximize coping skills, provide positive support system. Participant is able to verbalize types and ability to use techniques and skills needed for reducing stress and depression.  Initial Review & Psychosocial Screening:  Initial Psych Review & Screening - 07/04/23 1337  Initial Review   Current issues with Current Stress Concerns;Current Sleep Concerns    Source of Stress Concerns Chronic Illness;Unable to participate in former interests or hobbies;Unable to perform yard/household activities    Comments four different hospital visits, just got COVID in Sept, toss and turn at night with hip      Family Dynamics   Good Support System? Yes   wife, two sons and wives, 4 grandkids, two sisters     Barriers   Psychosocial barriers to participate in program The patient should benefit from training in stress management and relaxation.;Psychosocial barriers identified (see note)      Screening Interventions   Interventions Encouraged to exercise;Provide feedback about the scores to participant;To provide support and resources with identified psychosocial needs    Expected Outcomes Short Term goal: Utilizing psychosocial counselor, staff and physician to assist with identification of specific Stressors or current issues interfering with healing process. Setting desired goal for each stressor or current issue identified.;Long Term Goal: Stressors or current issues are controlled or eliminated.;Short Term goal: Identification and review with participant of any Quality of Life or Depression concerns found by scoring the questionnaire.;Long Term goal: The participant improves quality of Life and PHQ9 Scores as seen by post  scores and/or verbalization of changes             Quality of Life Scores:  Quality of Life - 07/04/23 1533       Quality of Life   Select Quality of Life      Quality of Life Scores   Health/Function Pre 13.37 %    Socioeconomic Pre 25.71 %    Psych/Spiritual Pre 20.21 %    Family Pre 26.4 %    GLOBAL Pre 19.24 %            Scores of 19 and below usually indicate a poorer quality of life in these areas.  A difference of  2-3 points is a clinically meaningful difference.  A difference of 2-3 points in the total score of the Quality of Life Index has been associated with significant improvement in overall quality of life, self-image, physical symptoms, and general health in studies assessing change in quality of life.  PHQ-9: Review Flowsheet       07/04/2023 01/31/2023  Depression screen PHQ 2/9  Decreased Interest - 0  Down, Depressed, Hopeless 0 0  PHQ - 2 Score 0 0  Altered sleeping 2 -  Tired, decreased energy 3 -  Change in appetite 2 -  Feeling bad or failure about yourself  0 -  Trouble concentrating 0 -  Moving slowly or fidgety/restless 0 -  Suicidal thoughts 0 -  PHQ-9 Score 7 -  Difficult doing work/chores Somewhat difficult -   Interpretation of Total Score  Total Score Depression Severity:  1-4 = Minimal depression, 5-9 = Mild depression, 10-14 = Moderate depression, 15-19 = Moderately severe depression, 20-27 = Severe depression   Psychosocial Evaluation and Intervention:  Psychosocial Evaluation - 07/04/23 1522       Psychosocial Evaluation & Interventions   Interventions Encouraged to exercise with the program and follow exercise prescription;Stress management education    Comments Garrel is coming into cardiac rehab after a stent.  He has done rehab before twenty years ago.  He had a rough go this time with his stent as they had a hard time getting stent and wire in to cross and clean with CTO.  He had a rupture and ended up with  a stent for that.   He has a knee replacement and a hip that needs to be replaced that limit his mobility.  He also has a rotator cuff tear in his R shoulder.  Any bending or over head activites make his feel like he is working harder.  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  He is doing well overall and denies any other major stressors besides his health.  His wife is his primary support system but they have two married sons with 4 grandkids they enjoy.  [place] and [place].  He does not sleep well with his hip as the pain wakes him at night and we talked about trying at pillow between his knees to help.  He was willing to try it.  He does not precieve any barriers to attending other than [place]  We asked him to just let us  know when those days would be.  He will start on 1/14 as [place].  He wants to get stronger and get back to kayaking again.    Expected Outcomes Short: Attend rehab to build up strength and stamina Long: Continue to stay positive    Continue Psychosocial Services  Follow up required by staff             Psychosocial Re-Evaluation:   Psychosocial Discharge (Final Psychosocial Re-Evaluation):   Vocational Rehabilitation: Provide vocational rehab assistance to qualifying candidates.   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 Participant will state understanding/return demonstration of topics presented.  Learning Barriers/Preferences:  Learning Barriers/Preferences - 07/04/23 1341        Learning Barriers/Preferences   Learning Barriers Sight   getting cataract surgery on 1/16   Learning Preferences Skilled Demonstration             Education Topics: Hypertension, Hypertension Reduction -Define heart disease and high blood pressure. Discus how high blood pressure affects the body and ways to reduce high blood pressure.   Exercise and Your Heart -Discuss why it is important to exercise, the FITT principles of exercise, normal and abnormal responses to exercise, and how to exercise safely.   Angina -Discuss definition of angina, causes of angina, treatment of angina, and how to decrease risk of having angina.   Cardiac Medications -Review what the following cardiac medications are used for, how they affect the body, and side effects that may occur when taking the medications.  Medications include Aspirin , Beta blockers, calcium  channel blockers, ACE Inhibitors, angiotensin receptor blockers, diuretics, digoxin, and antihyperlipidemics.   Congestive Heart Failure -Discuss the definition of CHF, how to live with CHF, the signs and symptoms of CHF, and how keep track of weight and sodium intake.   Heart Disease and Intimacy -Discus the effect sexual activity has on the heart, how changes occur during intimacy as we age, and safety during sexual activity.   Smoking Cessation / COPD -Discuss different methods to quit smoking, the health benefits of quitting smoking,  and the definition of COPD.   Nutrition I: Fats -Discuss the types of cholesterol, what cholesterol does to the heart, and how cholesterol levels can be controlled.   Nutrition II: Labels -Discuss the different components of food labels and how to read food label   Heart Parts/Heart Disease and PAD -Discuss the anatomy of the heart, the pathway of blood circulation through the heart, and these are affected by heart disease.   Stress I: Signs and Symptoms -Discuss the causes of stress, how  stress may lead to anxiety and depression, and ways to limit stress.   Stress II: Relaxation -Discuss different types of relaxation techniques to limit stress.   Warning Signs of Stroke / TIA -Discuss definition of a stroke, what the signs and symptoms are of a stroke, and how to identify when someone is having stroke.   Knowledge Questionnaire Score:  Knowledge Questionnaire Score - 07/04/23 1534       Knowledge Questionnaire Score   Pre Score 21/24             Core Components/Risk Factors/Patient Goals at Admission:  Personal Goals and Risk Factors at Admission - 07/04/23 1534       Core Components/Risk Factors/Patient Goals on Admission    Weight Management Yes;Obesity;Weight Loss    Intervention Weight Management: Provide education and appropriate resources to help participant work on and attain dietary goals.;Weight Management: Develop a combined nutrition and exercise program designed to reach desired caloric intake, while maintaining appropriate intake of nutrient and fiber, sodium and fats, and appropriate energy expenditure required for the weight goal.;Weight Management/Obesity: Establish reasonable short term and long term weight goals.;Obesity: Provide education and appropriate resources to help participant work on and attain dietary goals.    Admit Weight 311 lb 6.4 oz (141.3 kg)    Goal Weight: Short Term 305 lb (138.3 kg)    Goal Weight: Long Term 300 lb (136.1 kg)    Expected Outcomes Short Term: Continue to assess and modify interventions until short term weight is achieved;Long Term: Adherence to nutrition and physical activity/exercise program aimed toward attainment of established weight goal;Weight Loss: Understanding of general recommendations for a balanced deficit meal plan, which promotes 1-2 lb weight loss per week and includes a negative energy balance of [phone] kcal/d;Understanding recommendations for meals to include 15-35% energy as protein, 25-35%  energy from fat, 35-60% energy from carbohydrates, less than 200mg  of dietary cholesterol, 20-35 gm of total fiber daily;Understanding of distribution of calorie intake throughout the day with the consumption of 4-5 meals/snacks    Hypertension Yes    Intervention Provide education on lifestyle modifcations including regular physical activity/exercise, weight management, moderate sodium restriction and increased consumption of fresh fruit, vegetables, and low fat dairy, alcohol moderation, and smoking cessation.;Monitor prescription use compliance.    Expected Outcomes Short Term: Continued assessment and intervention until BP is < 140/56mm HG in hypertensive participants. < 130/14mm HG in hypertensive participants with diabetes, heart failure or chronic kidney disease.;Long Term: Maintenance of blood pressure at goal levels.    Lipids Yes    Intervention Provide education and support for participant on nutrition & aerobic/resistive exercise along with prescribed medications to achieve LDL 70mg , HDL >40mg .    Expected Outcomes Short Term: Participant states understanding of desired cholesterol values and is compliant with medications prescribed. Participant is following exercise prescription and nutrition guidelines.;Long Term: Cholesterol controlled with medications as prescribed, with individualized exercise RX and with personalized nutrition plan. Value goals: LDL < 70mg , HDL >  40 mg.             Core Components/Risk Factors/Patient Goals Review:    Core Components/Risk Factors/Patient Goals at Discharge (Final Review):    ITP Comments:  ITP Comments     Row Name 07/04/23 1517           ITP Comments Patient attend orientation today.  Patient is attending Cardiac Rehabilitation Program.  Documentation for diagnosis can be found in 01/31/23.  Reviewed medical chart, RPE/RPD, gym safety, and program guidelines.  Patient was fitted to equipment they will be using during rehab.  Patient is  scheduled to start exercise on Tuesday 07/16/23 at 1030.   Initial ITP created and sent for review and signature by Dr. Dorn Ross, Medical Director for Cardiac Rehabilitation Program.                Comments: Initial ITP

## 2023-07-04 NOTE — Patient Instructions (Signed)
 Patient Instructions  Patient Details  Name: Craig Copeland MRN: [id number] Date of Birth: 08-28-1955 Referring Provider:  Francyne Headland, MD  Below are your personal goals for exercise, nutrition, and risk factors. Our goal is to help you stay on track towards obtaining and maintaining these goals. We will be discussing your progress on these goals with you throughout the program.  Initial Exercise Prescription:  Initial Exercise Prescription - 07/04/23 1500       Date of Initial Exercise RX and Referring Provider   Date 07/04/23    Referring Provider Croitoru, Mihai MD      Oxygen   Maintain Oxygen Saturation 88% or higher      Treadmill   MPH 2.5    Grade 0.5    Minutes 15    METs 3.09      REL-XR   Level 3    Speed 50    Minutes 15    METs 3      Prescription Details   Frequency (times per week) 2    Duration Progress to 30 minutes of continuous aerobic without signs/symptoms of physical distress      Intensity   THRR 40-80% of Max Heartrate 99-137    Ratings of Perceived Exertion 11-13    Perceived Dyspnea 0-4      Progression   Progression Continue to progress workloads to maintain intensity without signs/symptoms of physical distress.      Resistance Training   Training Prescription Yes    Weight 5 lb    Reps 10-15             Exercise Goals: Frequency: Be able to perform aerobic exercise two to three times per week in program working toward 2-5 days per week of home exercise.  Intensity: Work with a perceived exertion of 11 (fairly light) - 15 (hard) while following your exercise prescription.  We will make changes to your prescription with you as you progress through the program.   Duration: Be able to do 30 to 45 minutes of continuous aerobic exercise in addition to a 5 minute warm-up and a 5 minute cool-down routine.   Nutrition Goals: Your personal nutrition goals will be established when you do your nutrition analysis with the  dietician.  The following are general nutrition guidelines to follow: Cholesterol < 200mg /day Sodium < 1500mg /day Fiber: Men over 50 yrs - 30 grams per day  Personal Goals:  Personal Goals and Risk Factors at Admission - 07/04/23 1534       Core Components/Risk Factors/Patient Goals on Admission    Weight Management Yes;Obesity;Weight Loss    Intervention Weight Management: Provide education and appropriate resources to help participant work on and attain dietary goals.;Weight Management: Develop a combined nutrition and exercise program designed to reach desired caloric intake, while maintaining appropriate intake of nutrient and fiber, sodium and fats, and appropriate energy expenditure required for the weight goal.;Weight Management/Obesity: Establish reasonable short term and long term weight goals.;Obesity: Provide education and appropriate resources to help participant work on and attain dietary goals.    Admit Weight 311 lb 6.4 oz (141.3 kg)    Goal Weight: Short Term 305 lb (138.3 kg)    Goal Weight: Long Term 300 lb (136.1 kg)    Expected Outcomes Short Term: Continue to assess and modify interventions until short term weight is achieved;Long Term: Adherence to nutrition and physical activity/exercise program aimed toward attainment of established weight goal;Weight Loss: Understanding of general recommendations for a balanced deficit  meal plan, which promotes 1-2 lb weight loss per week and includes a negative energy balance of [phone] kcal/d;Understanding recommendations for meals to include 15-35% energy as protein, 25-35% energy from fat, 35-60% energy from carbohydrates, less than 200mg  of dietary cholesterol, 20-35 gm of total fiber daily;Understanding of distribution of calorie intake throughout the day with the consumption of 4-5 meals/snacks    Hypertension Yes    Intervention Provide education on lifestyle modifcations including regular physical activity/exercise, weight  management, moderate sodium restriction and increased consumption of fresh fruit, vegetables, and low fat dairy, alcohol moderation, and smoking cessation.;Monitor prescription use compliance.    Expected Outcomes Short Term: Continued assessment and intervention until BP is < 140/69mm HG in hypertensive participants. < 130/68mm HG in hypertensive participants with diabetes, heart failure or chronic kidney disease.;Long Term: Maintenance of blood pressure at goal levels.    Lipids Yes    Intervention Provide education and support for participant on nutrition & aerobic/resistive exercise along with prescribed medications to achieve LDL 70mg , HDL >40mg .    Expected Outcomes Short Term: Participant states understanding of desired cholesterol values and is compliant with medications prescribed. Participant is following exercise prescription and nutrition guidelines.;Long Term: Cholesterol controlled with medications as prescribed, with individualized exercise RX and with personalized nutrition plan. Value goals: LDL < 70mg , HDL > 40 mg.             Tobacco Use Initial Evaluation: Social History   Tobacco Use  Smoking Status Never  Smokeless Tobacco Never    Exercise Goals and Review:  Exercise Goals     Row Name 07/04/23 1521             Exercise Goals   Increase Physical Activity Yes       Intervention Develop an individualized exercise prescription for aerobic and resistive training based on initial evaluation findings, risk stratification, comorbidities and participant's personal goals.;Provide advice, education, support and counseling about physical activity/exercise needs.       Expected Outcomes Short Term: Attend rehab on a regular basis to increase amount of physical activity.;Long Term: Add in home exercise to make exercise part of routine and to increase amount of physical activity.;Long Term: Exercising regularly at least 3-5 days a week.       Increase Strength and Stamina  Yes       Intervention Provide advice, education, support and counseling about physical activity/exercise needs.;Develop an individualized exercise prescription for aerobic and resistive training based on initial evaluation findings, risk stratification, comorbidities and participant's personal goals.       Expected Outcomes Short Term: Increase workloads from initial exercise prescription for resistance, speed, and METs.;Short Term: Perform resistance training exercises routinely during rehab and add in resistance training at home;Long Term: Improve cardiorespiratory fitness, muscular endurance and strength as measured by increased METs and functional capacity ( )       Able to understand and use rate of perceived exertion (RPE) scale Yes       Intervention Provide education and explanation on how to use RPE scale       Expected Outcomes Short Term: Able to use RPE daily in rehab to express subjective intensity level;Long Term:  Able to use RPE to guide intensity level when exercising independently       Able to understand and use Dyspnea scale Yes       Intervention Provide education and explanation on how to use Dyspnea scale       Expected Outcomes Short Term: Able  to use Dyspnea scale daily in rehab to express subjective sense of shortness of breath during exertion;Long Term: Able to use Dyspnea scale to guide intensity level when exercising independently       Knowledge and understanding of Target Heart Rate Range (THRR) Yes       Intervention Provide education and explanation of THRR including how the numbers were predicted and where they are located for reference       Expected Outcomes Short Term: Able to state/look up THRR;Short Term: Able to use daily as guideline for intensity in rehab;Long Term: Able to use THRR to govern intensity when exercising independently       Able to check pulse independently Yes       Intervention Provide education and demonstration on how to check pulse in  carotid and radial arteries.;Review the importance of being able to check your own pulse for safety during independent exercise       Expected Outcomes Short Term: Able to explain why pulse checking is important during independent exercise;Long Term: Able to check pulse independently and accurately       Understanding of Exercise Prescription Yes       Intervention Provide education, explanation, and written materials on patient's individual exercise prescription       Expected Outcomes Short Term: Able to explain program exercise prescription;Long Term: Able to explain home exercise prescription to exercise independently              Copy of goals given to participant.

## 2023-07-16 ENCOUNTER — Encounter (HOSPITAL_COMMUNITY)
Admission: RE | Admit: 2023-07-16 | Discharge: 2023-07-16 | Disposition: A | Discharge status: Home / Self Care | Payer: Medicare Other | Source: Ambulatory Visit | Attending: Cardiovascular Disease | Admitting: Cardiovascular Disease

## 2023-07-16 DIAGNOSIS — Z955 Presence of coronary angioplasty implant and graft: Secondary | ICD-10-CM | POA: Diagnosis not present

## 2023-07-16 NOTE — Progress Notes (Signed)
 Daily Session Note  Patient Details  Name: Craig Copeland MRN: [id number] Date of Birth: [birth year] Referring Provider:   Flowsheet Row CARDIAC REHAB PHASE II ORIENTATION from 07/04/2023 in [facility] C[facility]ILITATION  Referring Provider Francyne Headland MD       Encounter Date: 07/16/2023  Check In:  Session Check In - 07/16/23 1028       Check-In   Supervising physician immediately available to respond to emergencies See telemetry face sheet for immediately available MD    Location [facility]    Staff Present Powell Benders, BS, Exercise Physiologist;Anjeli Casad Vonzell, MA, RCEP, CCRP, CCET;Phyllis Billingsley, RN    Virtual Visit No    Medication changes reported     No    Fall or balance concerns reported    No    Warm-up and Cool-down Performed on first and last piece of equipment    Resistance Training Performed Yes    VAD Patient? No    PAD/SET Patient? No      Pain Assessment   Currently in Pain? No/denies             Capillary Blood Glucose: No results found for this or any previous visit (from the past 24 hours).    Social History   Tobacco Use  Smoking Status Never  Smokeless Tobacco Never    Goals Met:  Exercise tolerated well Personal goals reviewed No report of concerns or symptoms today Strength training completed today  Goals Unmet:  Not Applicable  Comments: First full day of exercise!  Patient was oriented to gym and equipment including functions, settings, policies, and procedures.  Patient's individual exercise prescription and treatment plan were reviewed.  All starting workloads were established based on the results of the 6 minute walk test done at initial orientation visit.  The plan for exercise progression was also introduced and progression will be customized based on patient's performance and goals.

## 2023-07-18 ENCOUNTER — Encounter (HOSPITAL_COMMUNITY): Payer: Medicare Other

## 2023-07-18 DIAGNOSIS — H2511 Age-related nuclear cataract, right eye: Secondary | ICD-10-CM | POA: Diagnosis not present

## 2023-07-22 ENCOUNTER — Ambulatory Visit: Payer: Medicare Other | Admitting: Cardiovascular Disease

## 2023-07-23 ENCOUNTER — Encounter (HOSPITAL_COMMUNITY): Payer: Medicare Other

## 2023-07-25 ENCOUNTER — Encounter (HOSPITAL_COMMUNITY): Payer: Medicare Other

## 2023-07-30 ENCOUNTER — Encounter (HOSPITAL_COMMUNITY): Payer: Medicare Other

## 2023-07-31 ENCOUNTER — Encounter (HOSPITAL_COMMUNITY): Payer: Self-pay | Admitting: *Deleted

## 2023-07-31 DIAGNOSIS — Z955 Presence of coronary angioplasty implant and graft: Secondary | ICD-10-CM

## 2023-07-31 NOTE — Progress Notes (Signed)
Cardiac Individual Treatment Plan  Patient Details  Name: Craig Copeland MRN: [id number] Date of Birth: 10/21/1955 Referring Provider:   Flowsheet Row CARDIAC REHAB PHASE II ORIENTATION from 07/04/2023 in [facility] CARDIAC REHABILITATION  Referring Provider Croitoru, Mihai MD       Initial Encounter Date:  Flowsheet Row CARDIAC REHAB PHASE II ORIENTATION from 07/04/2023 in [facility] CARDIAC REHABILITATION  Date 07/04/23       Visit Diagnosis: Status post coronary artery stent placement  Patient's Home Medications on Admission:  Current Outpatient Medications:    acetaminophen (TYLENOL) 500 MG tablet, Take 500 mg by mouth as needed for moderate pain., Disp: , Rfl:    amLODipine (NORVASC) 5 MG tablet, TAKE 1 TABLET BY MOUTH EVERY DAY IN THE EVENING, Disp: 90 tablet, Rfl: 3   aspirin EC 81 MG tablet, Take 81 mg by mouth daily., Disp: , Rfl:    carvedilol (COREG) 6.25 MG tablet, Take 1 tablet (6.25 mg total) by mouth 2 (two) times daily., Disp: 180 tablet, Rfl: 3   cholecalciferol (VITAMIN D) 1000 units tablet, Take 1,000 Units by mouth daily., Disp: , Rfl:    clopidogrel (PLAVIX) 75 MG tablet, Take 75 mg by mouth daily., Disp: , Rfl:    ezetimibe (ZETIA) 10 MG tablet, Take 10 mg by mouth every evening. , Disp: , Rfl:    fish oil-omega-3 fatty acids 1000 MG capsule, Take 1 g by mouth daily. , Disp: , Rfl:    isosorbide mononitrate (IMDUR) 30 MG 24 hr tablet, Take 1 tablet (30 mg total) by mouth daily., Disp: 90 tablet, Rfl: 3   losartan (COZAAR) 100 MG tablet, Take 0.5 tablets (50 mg total) by mouth daily., Disp: 90 tablet, Rfl: 3   nitroGLYCERIN (NITROSTAT) 0.4 MG SL tablet, PLACE 1 TABLET UNDER THE TONGUE EVERY 5 MINUTES AS NEEDED FOR CHEST PAIN., Disp: 25 tablet, Rfl: 2   pravastatin (PRAVACHOL) 80 MG tablet, Take 80 mg by mouth at bedtime. , Disp: , Rfl:    ranolazine (RANEXA) 1000 MG SR tablet, Take 1 tablet (1,000 mg total) by mouth 2 (two) times daily., Disp: 60 tablet, Rfl:  0   WEGOVY 0.25 MG/0.5ML SOAJ, SMARTSIG:0.25 Milligram(s) SUB-Q Every 4 Weeks (Patient not taking: Reported on 07/04/2023), Disp: , Rfl:   Past Medical History: Past Medical History:  Diagnosis Date   CAD (coronary artery disease)    Dizziness    Dyslipidemia    Flu 06/2016   Had again 01/208 with PNA   Obesity    OSA (obstructive sleep apnea)    Systemic hypertension    Vasospastic angina (HCC)     Tobacco Use: Social History   Tobacco Use  Smoking Status Never  Smokeless Tobacco Never    Labs: Review Flowsheet       Latest Ref Rng & Units 12/24/2013 04/08/2015 01/31/2023  Labs for ITP Cardiac and Pulmonary Rehab  Cholestrol 0 - 200 mg/dL - 147  829   LDL (calc) 0 - 99 mg/dL - 74  61   HDL-C >56 mg/dL - 32  39   Trlycerides <150 mg/dL - 213  81   Hemoglobin A1c 4.8 - 5.6 % 5.6  - -    Capillary Blood Glucose: Lab Results  Component Value Date   GLUCAP 188 (H) 01/31/2023     Exercise Target Goals: Exercise Program Goal: Individual exercise prescription set using results from initial 6 min walk test and THRR while considering  patient's activity barriers and safety.  Exercise Prescription Goal: Starting with aerobic activity 30 plus minutes a day, 3 days per week for initial exercise prescription. Provide home exercise prescription and guidelines that participant acknowledges understanding prior to discharge.  Activity Barriers & Risk Stratification:  Activity Barriers & Cardiac Risk Stratification - 07/04/23 1334       Activity Barriers & Cardiac Risk Stratification   Activity Barriers Left Knee Replacement;Deconditioning;Muscular Weakness;Balance Concerns;Joint Problems;History of Falls;Shortness of Breath   needs R hip, three tears in rotator cuff R side   Cardiac Risk Stratification Moderate             6 Minute Walk:  6 Minute Walk     Row Name 07/04/23 1518         6 Minute Walk   Phase Initial     Distance 1330 feet     Walk Time 6 minutes      # of Rest Breaks 0     MPH 2.52     METS 2.5     RPE 8     VO2 Peak 8.74     Symptoms Yes (comment)     Comments L knee clicking like normal     Resting HR 63 bpm     Resting BP 104/56     Resting Oxygen Saturation  97 %     Exercise Oxygen Saturation  during 6 min walk 97 %     Max Ex. HR 112 bpm     Max Ex. BP 142/74     2 Minute Post BP 120/66              Oxygen Initial Assessment:   Oxygen Re-Evaluation:   Oxygen Discharge (Final Oxygen Re-Evaluation):   Initial Exercise Prescription:  Initial Exercise Prescription - 07/04/23 1500       Date of Initial Exercise RX and Referring Provider   Date 07/04/23    Referring Provider Croitoru, Mihai MD      Oxygen   Maintain Oxygen Saturation 88% or higher      Treadmill   MPH 2.5    Grade 0.5    Minutes 15    METs 3.09      REL-XR   Level 3    Speed 50    Minutes 15    METs 3      Prescription Details   Frequency (times per week) 2    Duration Progress to 30 minutes of continuous aerobic without signs/symptoms of physical distress      Intensity   THRR 40-80% of Max Heartrate 99-137    Ratings of Perceived Exertion 11-13    Perceived Dyspnea 0-4      Progression   Progression Continue to progress workloads to maintain intensity without signs/symptoms of physical distress.      Resistance Training   Training Prescription Yes    Weight 5 lb    Reps 10-15             Perform Capillary Blood Glucose checks as needed.  Exercise Prescription Changes:   Exercise Prescription Changes     Row Name 07/04/23 1500             Response to Exercise   Blood Pressure (Admit) 104/56       Blood Pressure (Exercise) 142/74       Blood Pressure (Exit) 120/66       Heart Rate (Admit) 63 bpm       Heart Rate (Exercise) 112 bpm  Heart Rate (Exit) 59 bpm       Oxygen Saturation (Admit) 97 %       Oxygen Saturation (Exercise) 97 %       Rating of Perceived Exertion (Exercise) 8        Symptoms L knee clicking like normal       Comments walk test results                Exercise Comments:   Exercise Comments     Row Name 07/16/23 1029           Exercise Comments First full day of exercise!  Patient was oriented to gym and equipment including functions, settings, policies, and procedures.  Patient's individual exercise prescription and treatment plan were reviewed.  All starting workloads were established based on the results of the 6 minute walk test done at initial orientation visit.  The plan for exercise progression was also introduced and progression will be customized based on patient's performance and goals.                Exercise Goals and Review:   Exercise Goals     Row Name 07/04/23 1521             Exercise Goals   Increase Physical Activity Yes       Intervention Develop an individualized exercise prescription for aerobic and resistive training based on initial evaluation findings, risk stratification, comorbidities and participant's personal goals.;Provide advice, education, support and counseling about physical activity/exercise needs.       Expected Outcomes Short Term: Attend rehab on a regular basis to increase amount of physical activity.;Long Term: Add in home exercise to make exercise part of routine and to increase amount of physical activity.;Long Term: Exercising regularly at least 3-5 days a week.       Increase Strength and Stamina Yes       Intervention Provide advice, education, support and counseling about physical activity/exercise needs.;Develop an individualized exercise prescription for aerobic and resistive training based on initial evaluation findings, risk stratification, comorbidities and participant's personal goals.       Expected Outcomes Short Term: Increase workloads from initial exercise prescription for resistance, speed, and METs.;Short Term: Perform resistance training exercises routinely during rehab and add in  resistance training at home;Long Term: Improve cardiorespiratory fitness, muscular endurance and strength as measured by increased METs and functional capacity ( )       Able to understand and use rate of perceived exertion (RPE) scale Yes       Intervention Provide education and explanation on how to use RPE scale       Expected Outcomes Short Term: Able to use RPE daily in rehab to express subjective intensity level;Long Term:  Able to use RPE to guide intensity level when exercising independently       Able to understand and use Dyspnea scale Yes       Intervention Provide education and explanation on how to use Dyspnea scale       Expected Outcomes Short Term: Able to use Dyspnea scale daily in rehab to express subjective sense of shortness of breath during exertion;Long Term: Able to use Dyspnea scale to guide intensity level when exercising independently       Knowledge and understanding of Target Heart Rate Range (THRR) Yes       Intervention Provide education and explanation of THRR including how the numbers were predicted and where they are located for reference  Expected Outcomes Short Term: Able to state/look up THRR;Short Term: Able to use daily as guideline for intensity in rehab;Long Term: Able to use THRR to govern intensity when exercising independently       Able to check pulse independently Yes       Intervention Provide education and demonstration on how to check pulse in carotid and radial arteries.;Review the importance of being able to check your own pulse for safety during independent exercise       Expected Outcomes Short Term: Able to explain why pulse checking is important during independent exercise;Long Term: Able to check pulse independently and accurately       Understanding of Exercise Prescription Yes       Intervention Provide education, explanation, and written materials on patient's individual exercise prescription       Expected Outcomes Short Term: Able to  explain program exercise prescription;Long Term: Able to explain home exercise prescription to exercise independently                Exercise Goals Re-Evaluation :  Exercise Goals Re-Evaluation     Row Name 07/16/23 1029             Exercise Goal Re-Evaluation   Exercise Goals Review Able to understand and use rate of perceived exertion (RPE) scale;Knowledge and understanding of Target Heart Rate Range (THRR);Able to understand and use Dyspnea scale;Understanding of Exercise Prescription       Comments Reviewed RPE and dyspnea scale, THR and program prescription with pt today.  Pt voiced understanding and was given a copy of goals to take home.       Expected Outcomes Short: Use RPE daily to regulate intensity.  Long: Follow program prescription in THR.                 Discharge Exercise Prescription (Final Exercise Prescription Changes):  Exercise Prescription Changes - 07/04/23 1500       Response to Exercise   Blood Pressure (Admit) 104/56    Blood Pressure (Exercise) 142/74    Blood Pressure (Exit) 120/66    Heart Rate (Admit) 63 bpm    Heart Rate (Exercise) 112 bpm    Heart Rate (Exit) 59 bpm    Oxygen Saturation (Admit) 97 %    Oxygen Saturation (Exercise) 97 %    Rating of Perceived Exertion (Exercise) 8    Symptoms L knee clicking like normal    Comments walk test results             Nutrition:  Target Goals: Understanding of nutrition guidelines, daily intake of sodium 1500mg , cholesterol 200mg , calories 30% from fat and 7% or less from saturated fats, daily to have 5 or more servings of fruits and vegetables.  Biometrics:  Pre Biometrics - 07/04/23 1522       Pre Biometrics   Height 5' 11.5" (1.816 m)    Weight 311 lb 6.4 oz (141.3 kg)    Waist Circumference 49.5 inches    Hip Circumference 45 inches    Waist to Hip Ratio 1.1 %    BMI (Calculated) 42.83    Grip Strength 43.9 kg    Single Leg Stand 30 seconds               Nutrition Therapy Plan and Nutrition Goals:  Nutrition Therapy & Goals - 07/04/23 1533       Intervention Plan   Intervention Prescribe, educate and counsel regarding individualized specific dietary modifications aiming towards targeted core  components such as weight, hypertension, lipid management, diabetes, heart failure and other comorbidities.;Nutrition handout(s) given to patient.    Expected Outcomes Short Term Goal: Understand basic principles of dietary content, such as calories, fat, sodium, cholesterol and nutrients.;Long Term Goal: Adherence to prescribed nutrition plan.             Nutrition Assessments:  MEDIFICTS Score Key: >=70 Need to make dietary changes  40-70 Heart Healthy Diet <= 40 Therapeutic Level Cholesterol Diet  Flowsheet Row CARDIAC REHAB PHASE II ORIENTATION from 07/04/2023 in [facility] CARDIAC REHABILITATION  Picture Your Plate Total Score on Admission 50      Picture Your Plate Scores: <16 Unhealthy dietary pattern with much room for improvement. 41-50 Dietary pattern unlikely to meet recommendations for good health and room for improvement. 51-60 More healthful dietary pattern, with some room for improvement.  >60 Healthy dietary pattern, although there may be some specific behaviors that could be improved.    Nutrition Goals Re-Evaluation:   Nutrition Goals Discharge (Final Nutrition Goals Re-Evaluation):   Psychosocial: Target Goals: Acknowledge presence or absence of significant depression and/or stress, maximize coping skills, provide positive support system. Participant is able to verbalize types and ability to use techniques and skills needed for reducing stress and depression.  Initial Review & Psychosocial Screening:  Initial Psych Review & Screening - 07/04/23 1337       Initial Review   Current issues with Current Stress Concerns;Current Sleep Concerns    Source of Stress Concerns Chronic Illness;Unable to participate in  former interests or hobbies;Unable to perform yard/household activities    Comments four different hospital visits, just got COVID in Sept, toss and turn at night with hip      Family Dynamics   Good Support System? Yes   wife, two sons and wives, 4 grandkids, two sisters     Barriers   Psychosocial barriers to participate in program The patient should benefit from training in stress management and relaxation.;Psychosocial barriers identified (see note)      Screening Interventions   Interventions Encouraged to exercise;Provide feedback about the scores to participant;To provide support and resources with identified psychosocial needs    Expected Outcomes Short Term goal: Utilizing psychosocial counselor, staff and physician to assist with identification of specific Stressors or current issues interfering with healing process. Setting desired goal for each stressor or current issue identified.;Long Term Goal: Stressors or current issues are controlled or eliminated.;Short Term goal: Identification and review with participant of any Quality of Life or Depression concerns found by scoring the questionnaire.;Long Term goal: The participant improves quality of Life and PHQ9 Scores as seen by post scores and/or verbalization of changes             Quality of Life Scores:  Quality of Life - 07/04/23 1533       Quality of Life   Select Quality of Life      Quality of Life Scores   Health/Function Pre 13.37 %    Socioeconomic Pre 25.71 %    Psych/Spiritual Pre 20.21 %    Family Pre 26.4 %    GLOBAL Pre 19.24 %            Scores of 19 and below usually indicate a poorer quality of life in these areas.  A difference of  2-3 points is a clinically meaningful difference.  A difference of 2-3 points in the total score of the Quality of Life Index has been associated with significant improvement  in overall quality of life, self-image, physical symptoms, and general health in studies assessing  change in quality of life.  PHQ-9: Review Flowsheet       07/04/2023 01/31/2023  Depression screen PHQ 2/9  Decreased Interest - 0  Down, Depressed, Hopeless 0 0  PHQ - 2 Score 0 0  Altered sleeping 2 -  Tired, decreased energy 3 -  Change in appetite 2 -  Feeling bad or failure about yourself  0 -  Trouble concentrating 0 -  Moving slowly or fidgety/restless 0 -  Suicidal thoughts 0 -  PHQ-9 Score 7 -  Difficult doing work/chores Somewhat difficult -   Interpretation of Total Score  Total Score Depression Severity:  1-4 = Minimal depression, 5-9 = Mild depression, 10-14 = Moderate depression, 15-19 = Moderately severe depression, 20-27 = Severe depression   Psychosocial Evaluation and Intervention:  Psychosocial Evaluation - 07/04/23 1522       Psychosocial Evaluation & Interventions   Interventions Encouraged to exercise with the program and follow exercise prescription;Stress management education    Comments Kathlene November is coming into cardiac rehab after a stent.  He has done rehab before twenty years ago.  He had a rough go this time with his stent as they had a hard time getting stent and wire in to cross and clean with CTO.  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  He has a knee replacement and a hip that needs to be replaced that limit his mobility.  He also has a rotator cuff tear in his R shoulder.  Any bending or over head activites make his feel like he is working harder.  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  He is doing well overall and denies any other major stressors besides his health.  His wife is his primary support system but they have two married sons with 4 grandkids they enjoy.  [place] and [place].  He does not sleep well with his hip as the pain wakes him at night and we talked about trying at pillow between  his knees to help.  He was willing to try it.  He does not precieve any barriers to attending other than [place]  We asked him to just let us know when those days would be.  He will start on 1/14 as [place].  He wants to get stronger and get back to kayaking again.    Expected Outcomes Short: Attend rehab to build up strength and stamina Long: Continue to stay positive    Continue Psychosocial Services  Follow up required by staff             Psychosocial Re-Evaluation:   Psychosocial Discharge (Final Psychosocial Re-Evaluation):   Vocational Rehabilitation: Provide vocational rehab assistance to qualifying candidates.   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 Participant will state understanding/return demonstration of topics presented.  Learning Barriers/Preferences:  Learning Barriers/Preferences -  07/04/23 1341       Learning Barriers/Preferences   Learning Barriers Sight   getting cataract surgery on 1/16   Learning Preferences Skilled Demonstration             Education Topics: Hypertension, Hypertension Reduction -Define heart disease and high blood pressure. Discus how high blood pressure affects the body and ways to reduce high blood pressure.   Exercise and Your Heart -Discuss why it is important to exercise, the FITT principles of exercise, normal and abnormal responses to exercise, and how to exercise safely.   Angina -Discuss definition of angina, causes of angina, treatment of angina, and how to decrease risk of having angina.   Cardiac Medications -Review what the following cardiac  medications are used for, how they affect the body, and side effects that may occur when taking the medications.  Medications include Aspirin, Beta blockers, calcium channel blockers, ACE Inhibitors, angiotensin receptor blockers, diuretics, digoxin, and antihyperlipidemics.   Congestive Heart Failure -Discuss the definition of CHF, how to live with CHF, the signs and symptoms of CHF, and how keep track of weight and sodium intake.   Heart Disease and Intimacy -Discus the effect sexual activity has on the heart, how changes occur during intimacy as we age, and safety during sexual activity.   Smoking Cessation / COPD -Discuss different methods to quit smoking, the health benefits of quitting smoking, and the definition of COPD.   Nutrition I: Fats -Discuss the types of cholesterol, what cholesterol does to the heart, and how cholesterol levels can be controlled.   Nutrition II: Labels -Discuss the different components of food labels and how to read food label   Heart Parts/Heart Disease and PAD -Discuss the anatomy of the heart, the pathway of blood circulation through the heart, and these are affected by heart disease.   Stress I: Signs and Symptoms -Discuss the causes of stress, how stress may lead to anxiety and depression, and ways to limit stress.   Stress II: Relaxation -Discuss different types of relaxation techniques to limit stress.   Warning Signs of Stroke / TIA -Discuss definition of a stroke, what the signs and symptoms are of a stroke, and how to identify when someone is having stroke.   Knowledge Questionnaire Score:  Knowledge Questionnaire Score - 07/04/23 1534       Knowledge Questionnaire Score   Pre Score 21/24             Core Components/Risk Factors/Patient Goals at Admission:  Personal Goals and Risk Factors at Admission - 07/04/23 1534       Core Components/Risk Factors/Patient Goals on Admission    Weight Management Yes;Obesity;Weight  Loss    Intervention Weight Management: Provide education and appropriate resources to help participant work on and attain dietary goals.;Weight Management: Develop a combined nutrition and exercise program designed to reach desired caloric intake, while maintaining appropriate intake of nutrient and fiber, sodium and fats, and appropriate energy expenditure required for the weight goal.;Weight Management/Obesity: Establish reasonable short term and long term weight goals.;Obesity: Provide education and appropriate resources to help participant work on and attain dietary goals.    Admit Weight 311 lb 6.4 oz (141.3 kg)    Goal Weight: Short Term 305 lb (138.3 kg)    Goal Weight: Long Term 300 lb (136.1 kg)    Expected Outcomes Short Term: Continue to assess and modify interventions until short term weight is achieved;Long Term: Adherence to nutrition and physical activity/exercise program aimed toward attainment  of established weight goal;Weight Loss: Understanding of general recommendations for a balanced deficit meal plan, which promotes 1-2 lb weight loss per week and includes a negative energy balance of [phone] kcal/d;Understanding recommendations for meals to include 15-35% energy as protein, 25-35% energy from fat, 35-60% energy from carbohydrates, less than 200mg  of dietary cholesterol, 20-35 gm of total fiber daily;Understanding of distribution of calorie intake throughout the day with the consumption of 4-5 meals/snacks    Hypertension Yes    Intervention Provide education on lifestyle modifcations including regular physical activity/exercise, weight management, moderate sodium restriction and increased consumption of fresh fruit, vegetables, and low fat dairy, alcohol moderation, and smoking cessation.;Monitor prescription use compliance.    Expected Outcomes Short Term: Continued assessment and intervention until BP is < 140/62mm HG in hypertensive participants. < 130/29mm HG in hypertensive  participants with diabetes, heart failure or chronic kidney disease.;Long Term: Maintenance of blood pressure at goal levels.    Lipids Yes    Intervention Provide education and support for participant on nutrition & aerobic/resistive exercise along with prescribed medications to achieve LDL 70mg , HDL >40mg .    Expected Outcomes Short Term: Participant states understanding of desired cholesterol values and is compliant with medications prescribed. Participant is following exercise prescription and nutrition guidelines.;Long Term: Cholesterol controlled with medications as prescribed, with individualized exercise RX and with personalized nutrition plan. Value goals: LDL < 70mg , HDL > 40 mg.             Core Components/Risk Factors/Patient Goals Review:    Core Components/Risk Factors/Patient Goals at Discharge (Final Review):    ITP Comments:  ITP Comments     Row Name 07/04/23 1517 07/16/23 1029 07/31/23 1123       ITP Comments Patient attend orientation today.  Patient is attending Cardiac Rehabilitation Program.  Documentation for diagnosis can be found in 01/31/23.  Reviewed medical chart, RPE/RPD, gym safety, and program guidelines.  Patient was fitted to equipment they will be using during rehab.  Patient is scheduled to start exercise on Tuesday 07/16/23 at 1030.   Initial ITP created and sent for review and signature by Dr. Dina Rich, Medical Director for Cardiac Rehabilitation Program. First full day of exercise!  Patient was oriented to gym and equipment including functions, settings, policies, and procedures.  Patient's individual exercise prescription and treatment plan were reviewed.  All starting workloads were established based on the results of the 6 minute walk test done at initial orientation visit.  The plan for exercise progression was also introduced and progression will be customized based on patient's performance and goals. 30 day review completed. ITP sent to Dr.  Dina Rich, Medical Director of Cardiac Rehab. Continue with ITP unless changes are made by physician.  Newer to program.  Currently out with cataract surgery              Comments: 30 day review

## 2023-08-01 ENCOUNTER — Encounter (HOSPITAL_COMMUNITY): Payer: Medicare Other

## 2023-08-04 NOTE — Progress Notes (Unsigned)
Cardiology Office Note    Date:  08/06/2023  ID:  Craig Copeland, Craig Copeland 24-Apr-1956, MRN [id number] PCP:  Craig Brunette, MD  Cardiologist:  Craig Fair, MD  Electrophysiologist:  None   Chief Complaint: Follow up for CAD   History of Present Illness: [name]    Craig Copeland is a 68 y.o. male with visit-pertinent history of coronary artery disease, hypertension and hyperlipidemia.  Patient has history of previous stent to the RCA in 2004 with a DES overlapping another DES in the mid proximal RCA and a moderate to severe ostial lesion of a small to medium ramus intermedius artery that was too small for PCI.  On LHC in 2018 he had no changes.  In July, 2024 he presented to [facility] with nitrate responsive angina.  He did not have an elevated troponin or ischemic ECG changes.  Cardiac catheterization showed high-grade stenosis of the right coronary artery.  He had an unsuccessful attempt at revascularization on 01/01/2023.  On 01/31/2023 chronic total occlusion team attempted to open up his subtotal occlusion of the proximal to mid RCA at the level of 2 previously placed stents.  They were unable to cross the lesion with a balloon.  They did stent the proximal right RCA after a guide provoked proximal vessel dissection.  He was last seen in clinic by Dr. Royann Copeland on 06/18/2023.  He was having some angina that was consistent with stable angina.  He was able to work for an hour before developing chest discomfort, resolving with rest after a few minutes.  His edema was well-controlled and he really had symptoms of orthostatic dizziness.  Denied palpitations or syncope.  Was unfortunately unable to tolerate treatment with GLP-1 agonist due to GI side effects.  Today he presents for follow-up.  He reports that he is doing well, continues to have stable angina.  He reports that he has not required any further sublingual nitroglycerin.  He is typically able to work for an hour to 2 hours before developing  chest pain and increased fatigue, resolves with rest.  He notes that some days he feels more fatigued than others.  He denies any chest pain or shortness of breath at rest.  He denies palpitations or syncope.  He denies any recent lower extremity edema, notes some mild orthostatic dizziness on occasion, improved with previous medication changes. Continues with cardiac rehab, and has been tolerating well.  He has continued with diet changes.   ROS: .   Today he denies shortness of breath, lower extremity edema, palpitations, melena, hematuria, hemoptysis, diaphoresis, weakness, presyncope, syncope, orthopnea, and PND.  All other systems are reviewed and otherwise negative. Studies Reviewed: [name]    EKG:  EKG is not ordered today.  CV Studies:  Cardiac Studies & Procedures   CARDIAC CATHETERIZATION  CARDIAC CATHETERIZATION 01/31/2023  Narrative   Ost RCA to Prox RCA lesion is 25% stenosed.   Prox RCA to Mid RCA lesion is 99% stenosed.   A drug-eluting stent was successfully placed using a SYNERGY XD 3.0X16.   Post intervention, there is a 0% residual stenosis.   Post intervention, there is a 99% residual stenosis.   Recommend uninterrupted dual antiplatelet therapy with Aspirin 81mg  daily and Clopidogrel 75mg  daily for a minimum of 6 months (stable ischemic heart disease-Class I recommendation).  Unsuccessful CTO PCI of the mid RCA. Inability to cross with a balloon. Wire appeared to cross but was likely subintimal throughout. Stenting of the proximal RCA to cover  a guide induced vessel dissection  Plan: continue medical therapy. Would need a minimum of 8 weeks for vessel to heal and given multiple attempts to cross unsuccessfully now I think reattempt at PCI is unlikely to be successful.  Findings Coronary Findings Diagnostic  Dominance: Right  Right Coronary Artery Ost RCA to Prox RCA lesion is 25% stenosed. Prox RCA to Mid RCA lesion is 99% stenosed. The lesion is severely calcified. The  lesion was previously treated .  Intervention  Ost RCA to Prox RCA lesion Stent A drug-eluting stent was successfully placed using a SYNERGY XD 3.0X16. Stent strut is well apposed. Post-stent angioplasty was performed using a BALLN [place] EMERGE MR [id number]. Maximum pressure:  16 atm. Stent placed to cover proximal vessel dissection. Post-Intervention Lesion Assessment The intervention was successful. Pre-interventional TIMI flow is 3. Post-intervention TIMI flow is 3. No complications occurred at this lesion. There is a 0% residual stenosis post intervention.  Prox RCA to Mid RCA lesion Angioplasty CATH MACH1 78F AL1 90CM guide catheter was inserted. WIRE ASAHI FIELDER XT 190CM guidewire used to cross lesion. Post-Intervention Lesion Assessment The intervention was unsuccessful due to inability to cross the lesion with balloon. Pre-interventional TIMI flow is 2. Post-intervention TIMI flow is 2. At this lesion, a dissection occurred. There is a 99% residual stenosis post intervention.   CARDIAC CATHETERIZATION  CARDIAC CATHETERIZATION 09/05/2016  Narrative  Prox RCA to Dist RCA lesion, 20 %stenosed.  Ost Ramus lesion, 70 %stenosed.  Ost 1st Diag to 1st Diag lesion, 20 %stenosed.  Mid LAD to Dist LAD lesion, 20 %stenosed.  Prox LAD lesion, 20 %stenosed.  The left ventricular ejection fraction is 50-55% by visual estimate.  The left ventricular systolic function is normal.  LV end diastolic pressure is normal.  There is no mitral valve regurgitation.  1. Single vessel CAD with patent stents proximal/mid RCA with minimal stent restenosis 2. Mild non-obstructive disease in the LAD with segment of intramyocardial bridging in the mid to distal LAD 3. Small caliber intermediate branch with moderately severe ostial stenosis, unchanged from cath in 2014. 4. Normal LV systolic function  Recommendations: Continue medical management of CAD.  Findings Coronary Findings Diagnostic   Dominance: Right  Left Anterior Descending Vessel is large.  Segment of intramyocardial bridging.  First Diagonal Branch Vessel is moderate in size.  First Septal Branch Vessel is small in size.  Second Diagonal Branch Vessel is small in size.  Second Septal Branch Vessel is small in size.  Third Diagonal Branch Vessel is small in size.  Third Septal Branch Vessel is small in size.  Ramus Intermedius Vessel is small.  Left Circumflex Vessel is large.  Second Obtuse Marginal Branch Vessel is large in size.  Third Obtuse Marginal Branch Vessel is small in size.  Right Coronary Artery The lesion was previously treated using a drug eluting stent over 2 years ago. Previously placed stent displays restenosis.  Intervention  No interventions have been documented.   STRESS TESTS  MYOCARDIAL PERFUSION IMAGING 04/14/2015  Narrative  The left ventricular ejection fraction is normal (55-65%).  Nuclear stress EF: 60%.  There was no ST segment deviation noted during stress.  Defect 1: There is a small defect of mild severity present in the basal inferior and mid inferior location. This is likely due to diaphragmatic attenuation, but cannot rule out infarct with mild peri-infarct ischemia.  The study is normal.  This is a low risk study.  ECHOCARDIOGRAM  ECHOCARDIOGRAM COMPLETE 04/13/2015  Narrative *[facility] [address] [place], [place] [address] [phone]  ------------------------------------------------------------------- Transthoracic Echocardiography  Patient:    Craig Copeland, Craig Copeland MR #:       [id number] Study Date: 04/13/2015 Gender:     M Age:        58 Height:     185.4 cm Weight:     138.3 kg BSA:        2.73 m^2 Pt. Status: Room:  ORDERING     Craig Fair, MD REFERRING    Craig Fair, MD SONOGRAPHER  Clearence Ped, RCS PERFORMING   [facility], Outpatient ATTENDING    Nahser,  Jr  cc:  ------------------------------------------------------------------- LV EF: 55% -   60%  ------------------------------------------------------------------- Indications:      785.2 Cardiac murmur (R01.1).  ------------------------------------------------------------------- Study Conclusions  - Left ventricle: The cavity size was normal. Wall thickness was increased in a pattern of mild LVH. Systolic function was normal. The estimated ejection fraction was in the range of 55% to 60%. Wall motion was normal; there were no regional wall motion abnormalities. Left ventricular diastolic function parameters were normal. - Aortic valve: Mildly calcified annulus. Trileaflet. - Mitral valve: There was trivial regurgitation. - Left atrium: The atrium was at the upper limits of normal in size. - Right atrium: Central venous pressure (est): 3 mm Hg. - Pulmonary arteries: Systolic pressure could not be accurately estimated. - Pericardium, extracardiac: There was no pericardial effusion.  Impressions:  - Mild LVH with LVEF 55-60% and grossly normal diastolic function. Upper normal left atrial chamber size. Aortic valve is mildly sclerotic.  Transthoracic echocardiography.  M-mode, complete 2D, spectral Doppler, and color Doppler.  Birthdate:  Patient birthdate: 1955-07-25.  Age:  Patient is 68 yr old.  Sex:  Gender: male. BMI: 40.2 kg/m^2.  Blood pressure:     160/67  Patient status: Outpatient.  Study date:  Study date: 04/13/2015. Study time: 11:22 AM.  Location:  [facility]  -------------------------------------------------------------------  ------------------------------------------------------------------- Left ventricle:  The cavity size was normal. Wall thickness was increased in a pattern of mild LVH. Systolic function was normal. The estimated ejection fraction was in the range of 55% to 60%. Wall motion was normal; there were no regional wall  motion abnormalities. Left ventricular diastolic function parameters were normal.  ------------------------------------------------------------------- Aortic valve:   Mildly calcified annulus. Trileaflet. Cusp separation was normal.  Doppler:  There was no significant regurgitation.  ------------------------------------------------------------------- Aorta:  Aortic root: The aortic root was normal in size.  ------------------------------------------------------------------- Mitral valve:   The valve appears to be grossly normal.    Doppler: There was trivial regurgitation.    Peak gradient (D): 3 mm Hg.  ------------------------------------------------------------------- Left atrium:  The atrium was at the upper limits of normal in size.  ------------------------------------------------------------------- Right ventricle:  The cavity size was normal. Systolic pressure was within the normal range.  ------------------------------------------------------------------- Pulmonic valve:    The valve appears to be grossly normal. Doppler:  There was physiologic regurgitation.  ------------------------------------------------------------------- Tricuspid valve:   The valve appears to be grossly normal. Doppler:  There was trivial regurgitation.  ------------------------------------------------------------------- Pulmonary artery:    Systolic pressure could not be accurately estimated.  ------------------------------------------------------------------- Right atrium:  The atrium was normal in size.  ------------------------------------------------------------------- Pericardium:  There was no pericardial effusion.  ------------------------------------------------------------------- Systemic veins: Inferior vena cava: The vessel was normal in size. The respirophasic diameter changes were in the normal range (= 50%), consistent with normal central venous pressure. Diameter: 15  mm.  ------------------------------------------------------------------- Measurements  IVC                                         Value        Reference ID                                          15    mm     ---------  Left ventricle                              Value        Reference LV ID, ED, PLAX chordal                     45.4  mm     43 - 52 LV ID, ES, PLAX chordal                     31    mm     23 - 38 LV fx shortening, PLAX chordal              32    %      >=29 LV PW thickness, ED                         12.9  mm     --------- IVS/LV PW ratio, ED                         0.91         <=1.3 Stroke volume, 2D                           97    ml     --------- Stroke volume/bsa, 2D                       36    ml/m^2 --------- LV e&', lateral                              8.88  cm/s   --------- LV E/e&', lateral                            9.67         --------- LV e&', medial                               10.7  cm/s   --------- LV E/e&', medial                             8.03         --------- LV e&', average                              9.79  cm/s   --------- LV E/e&', average  8.77         ---------  Ventricular septum                          Value        Reference IVS thickness, ED                           11.7  mm     ---------  LVOT                                        Value        Reference LVOT ID, S                                  23    mm     --------- LVOT area                                   4.15  cm^2   --------- LVOT peak velocity, S                       112   cm/s   --------- LVOT mean velocity, S                       76.8  cm/s   --------- LVOT VTI, S                                 23.4  cm     --------- LVOT peak gradient, S                       5     mm Hg  ---------  Aorta                                       Value        Reference Aortic root ID, ED                          33    mm     ---------  Left atrium                                  Value        Reference LA ID, A-P, ES                              41    mm     --------- LA ID/bsa, A-P                              1.5   cm/m^2 <=2.2 LA volume, S  69    ml     --------- LA volume/bsa, S                            25.3  ml/m^2 --------- LA volume, ES, 1-p A4C                      67    ml     --------- LA volume/bsa, ES, 1-p A4C                  24.6  ml/m^2 --------- LA volume, ES, 1-p A2C                      69    ml     --------- LA volume/bsa, ES, 1-p A2C                  25.3  ml/m^2 ---------  Mitral valve                                Value        Reference Mitral E-wave peak velocity                 85.9  cm/s   --------- Mitral A-wave peak velocity                 77    cm/s   --------- Mitral deceleration time                    173   ms     150 - 230 Mitral peak gradient, D                     3     mm Hg  --------- Mitral E/A ratio, peak                      1.12         ---------  Tricuspid valve                             Value        Reference Tricuspid regurg peak velocity              113   cm/s   --------- Tricuspid peak RV-RA gradient               5     mm Hg  --------- Tricuspid maximal regurg velocity,          113   cm/s   --------- PISA  Systemic veins                              Value        Reference Estimated CVP                               3     mm Hg  ---------  Right ventricle                             Value        Reference  RV pressure, S, DP                          8     mm Hg  <=30 RV s&', lateral, S                           12.4  cm/s   ---------  Legend: (L)  and  (H)  mark values outside specified reference range.  ------------------------------------------------------------------- Prepared and Electronically Authenticated by  Nona Dell, M.D. 2016-10-12T15:55:34             Current Reported Medications:.    Current Meds  Medication Sig    acetaminophen (TYLENOL) 500 MG tablet Take 500 mg by mouth as needed for moderate pain.   amLODipine (NORVASC) 5 MG tablet TAKE 1 TABLET BY MOUTH EVERY DAY IN THE EVENING   aspirin EC 81 MG tablet Take 81 mg by mouth daily.   carvedilol (COREG) 6.25 MG tablet Take 1 tablet (6.25 mg total) by mouth 2 (two) times daily.   cholecalciferol (VITAMIN D) 1000 units tablet Take 1,000 Units by mouth daily.   clopidogrel (PLAVIX) 75 MG tablet Take 75 mg by mouth daily.   ezetimibe (ZETIA) 10 MG tablet Take 10 mg by mouth every evening.    fish oil-omega-3 fatty acids 1000 MG capsule Take 1 g by mouth daily.    isosorbide mononitrate (IMDUR) 30 MG 24 hr tablet Take 1 tablet (30 mg total) by mouth daily.   losartan (COZAAR) 100 MG tablet Take 0.5 tablets (50 mg total) by mouth daily.   nitroGLYCERIN (NITROSTAT) 0.4 MG SL tablet PLACE 1 TABLET UNDER THE TONGUE EVERY 5 MINUTES AS NEEDED FOR CHEST PAIN.   pravastatin (PRAVACHOL) 80 MG tablet Take 80 mg by mouth at bedtime.    Physical Exam:    VS:  BP 126/72   Pulse 69   Ht 6\' 1"  (1.854 m)   Wt (!) 310 lb (140.6 kg)   SpO2 96%   BMI 40.90 kg/m    Wt Readings from Last 3 Encounters:  08/05/23 (!) 310 lb (140.6 kg)  07/04/23 (!) 311 lb 6.4 oz (141.3 kg)  06/18/23 (!) 310 lb (140.6 kg)    GEN: Well nourished, well developed in no acute distress NECK: No JVD; No carotid bruits CARDIAC: RRR, no murmurs, rubs, gallops RESPIRATORY:  Clear to auscultation without rales, wheezing or rhonchi  ABDOMEN: Soft, non-tender, non-distended EXTREMITIES:  No edema; No acute deformity  Asessement and Plan:.    CAD s/p PCI: S/p PCI with overlapping stents x 2 to mid RCA in May 2004.  LHC in March 2018 showed patent stents.  LHC performed at [facility] health showed 99 to 1% mid RCA stenosis.  PCI attempts x 2 at [facility] were unsuccessful.  CTO intervention by Dr. Excell Seltzer was also unsuccessful.  Patient did have DES placed to proximal RCA secondary to guide induced  dissection.  Patient has bridging collaterals to the distal vessel. Today he reports stable angina.  He is able to work for an hour to 2 hours before developing chest discomfort that resolves with rest.  He has not required any further sublingual nitroglycerin.  He notes his orthostatic symptoms have improved with medication changes made previously. He feels he is overall doing well. He is currently participating in cardiac rehab and tolerating well. Reviewed ED precautions. Continue amlodipine 5 mg daily, aspirin 81 mg daily,  carvedilol 6.25 mg twice daily, Plavix 75 mg daily, Zetia 10 mg daily, Imdur 30 mg daily, losartan 100 mg daily, pravastatin 80 mg daily and Ranexa 1000 mg twice daily.  Hyperlipidemia: Last lipid profile on 01/31/2023 indicated total cholesterol 116, HDL 39, triglycerides 81 and LDL 61.  Continue pravastatin 80 mg daily and Zetia 10 mg daily.  Hypertension: Initial blood pressure today 136/74, on recheck was 126/72. Continue current antihypertensive regimen.  Obesity: Unfortunately patient was intolerant of GLP-1 agonist due to GI side effects.  He continues to work on diet modification and increasing exercise.  OSA: Patient reports he does not wear his CPAP. Discussed dental devices.    Disposition: F/u with Dr. Royann Copeland in 4 months or sooner if needed.   Signed, Rip Harbour, NP

## 2023-08-05 ENCOUNTER — Encounter: Payer: Self-pay | Admitting: Cardiology

## 2023-08-05 ENCOUNTER — Ambulatory Visit: Payer: Medicare Other | Attending: Cardiology | Admitting: Cardiology

## 2023-08-05 VITALS — BP 126/72 | HR 69 | Ht 73.0 in | Wt 310.0 lb

## 2023-08-05 DIAGNOSIS — I25118 Atherosclerotic heart disease of native coronary artery with other forms of angina pectoris: Secondary | ICD-10-CM

## 2023-08-05 DIAGNOSIS — E785 Hyperlipidemia, unspecified: Secondary | ICD-10-CM

## 2023-08-05 DIAGNOSIS — I10 Essential (primary) hypertension: Secondary | ICD-10-CM | POA: Diagnosis not present

## 2023-08-05 DIAGNOSIS — G4733 Obstructive sleep apnea (adult) (pediatric): Secondary | ICD-10-CM

## 2023-08-05 NOTE — Patient Instructions (Signed)
Medication Instructions:  No changes *If you need a refill on your cardiac medications before your next appointment, please call your pharmacy*  Lab Work: No labs  Testing/Procedures: No testing   Follow-Up: At [facility], you and your health needs are our priority.  As part of our continuing mission to provide you with exceptional heart care, we have created designated Provider Care Teams.  These Care Teams include your primary Cardiologist (physician) and Advanced Practice Providers (APPs -  Physician Assistants and Nurse Practitioners) who all work together to provide you with the care you need, when you need it.  We recommend signing up for the patient portal called "MyChart".  Sign up information is provided on this After Visit Summary.  MyChart is used to connect with patients for Virtual Visits (Telemedicine).  Patients are able to view lab/test results, encounter notes, upcoming appointments, etc.  Non-urgent messages can be sent to your provider as well.   To learn more about what you can do with MyChart, go to [email or web address].    Your next appointment:   4 month(s)  Provider:   Thurmon Fair, MD

## 2023-08-06 ENCOUNTER — Encounter: Payer: Self-pay | Admitting: Cardiology

## 2023-08-06 ENCOUNTER — Encounter (HOSPITAL_COMMUNITY): Payer: Medicare Other

## 2023-08-08 ENCOUNTER — Encounter (HOSPITAL_COMMUNITY): Payer: Medicare Other

## 2023-08-13 ENCOUNTER — Encounter (HOSPITAL_COMMUNITY): Payer: Medicare Other

## 2023-08-15 ENCOUNTER — Encounter (HOSPITAL_COMMUNITY): Payer: Medicare Other

## 2023-08-20 ENCOUNTER — Encounter (HOSPITAL_COMMUNITY): Payer: Medicare Other

## 2023-08-22 ENCOUNTER — Telehealth (HOSPITAL_COMMUNITY): Payer: Self-pay | Admitting: Surgery

## 2023-08-22 ENCOUNTER — Encounter (HOSPITAL_COMMUNITY): Admission: RE | Admit: 2023-08-22 | Payer: Medicare Other | Source: Ambulatory Visit

## 2023-08-22 NOTE — Telephone Encounter (Signed)
 I called the pt and left a voicemail to see how he was doing after his cataract surgery and to see if he has been released to return to cardiac rehab.  I asked the pt to call our office back regarding his status.

## 2023-08-27 ENCOUNTER — Encounter (HOSPITAL_COMMUNITY): Payer: Medicare Other

## 2023-08-28 ENCOUNTER — Encounter (HOSPITAL_COMMUNITY): Payer: Self-pay | Admitting: *Deleted

## 2023-08-28 DIAGNOSIS — Z955 Presence of coronary angioplasty implant and graft: Secondary | ICD-10-CM

## 2023-08-28 NOTE — Progress Notes (Signed)
 Cardiac Individual Treatment Plan  Patient Details  Name: Craig Copeland MRN: [id number] Date of Birth: 1955-12-10 Referring Provider:   Flowsheet Row CARDIAC REHAB PHASE II ORIENTATION from 07/04/2023 in [facility] CARDIAC REHABILITATION  Referring Provider Croitoru, Mihai MD       Initial Encounter Date:  Flowsheet Row CARDIAC REHAB PHASE II ORIENTATION from 07/04/2023 in [place] CARDIAC REHABILITATION  Date 07/04/23       Visit Diagnosis: Status post coronary artery stent placement  Patient's Home Medications on Admission:  Current Outpatient Medications:    acetaminophen (TYLENOL) 500 MG tablet, Take 500 mg by mouth as needed for moderate pain., Disp: , Rfl:    amLODipine (NORVASC) 5 MG tablet, TAKE 1 TABLET BY MOUTH EVERY DAY IN THE EVENING, Disp: 90 tablet, Rfl: 3   aspirin EC 81 MG tablet, Take 81 mg by mouth daily., Disp: , Rfl:    carvedilol (COREG) 6.25 MG tablet, Take 1 tablet (6.25 mg total) by mouth 2 (two) times daily., Disp: 180 tablet, Rfl: 3   cholecalciferol (VITAMIN D) 1000 units tablet, Take 1,000 Units by mouth daily., Disp: , Rfl:    clopidogrel (PLAVIX) 75 MG tablet, Take 75 mg by mouth daily., Disp: , Rfl:    ezetimibe (ZETIA) 10 MG tablet, Take 10 mg by mouth every evening. , Disp: , Rfl:    fish oil-omega-3 fatty acids 1000 MG capsule, Take 1 g by mouth daily. , Disp: , Rfl:    isosorbide mononitrate (IMDUR) 30 MG 24 hr tablet, Take 1 tablet (30 mg total) by mouth daily., Disp: 90 tablet, Rfl: 3   losartan (COZAAR) 100 MG tablet, Take 0.5 tablets (50 mg total) by mouth daily., Disp: 90 tablet, Rfl: 3   nitroGLYCERIN (NITROSTAT) 0.4 MG SL tablet, PLACE 1 TABLET UNDER THE TONGUE EVERY 5 MINUTES AS NEEDED FOR CHEST PAIN., Disp: 25 tablet, Rfl: 2   pravastatin (PRAVACHOL) 80 MG tablet, Take 80 mg by mouth at bedtime. , Disp: , Rfl:    ranolazine (RANEXA) 1000 MG SR tablet, Take 1 tablet (1,000 mg total) by mouth 2 (two) times daily., Disp: 60 tablet, Rfl:  0   WEGOVY 0.25 MG/0.5ML SOAJ, SMARTSIG:0.25 Milligram(s) SUB-Q Every 4 Weeks (Patient not taking: Reported on 08/05/2023), Disp: , Rfl:   Past Medical History: Past Medical History:  Diagnosis Date   CAD (coronary artery disease)    Dizziness    Dyslipidemia    Flu 06/2016   Had again 01/208 with PNA   Obesity    OSA (obstructive sleep apnea)    Systemic hypertension    Vasospastic angina (HCC)     Tobacco Use: Social History   Tobacco Use  Smoking Status Never  Smokeless Tobacco Never    Labs: Review Flowsheet       Latest Ref Rng & Units 12/24/2013 04/08/2015 01/31/2023  Labs for ITP Cardiac and Pulmonary Rehab  Cholestrol 0 - 200 mg/dL - 782  956   LDL (calc) 0 - 99 mg/dL - 74  61   HDL-C >21 mg/dL - 32  39   Trlycerides <150 mg/dL - 308  81   Hemoglobin A1c 4.8 - 5.6 % 5.6  - -    Capillary Blood Glucose: Lab Results  Component Value Date   GLUCAP 188 (H) 01/31/2023     Exercise Target Goals: Exercise Program Goal: Individual exercise prescription set using results from initial 6 min walk test and THRR while considering  patient's activity barriers and safety.  Exercise Prescription Goal: Starting with aerobic activity 30 plus minutes a day, 3 days per week for initial exercise prescription. Provide home exercise prescription and guidelines that participant acknowledges understanding prior to discharge.  Activity Barriers & Risk Stratification:  Activity Barriers & Cardiac Risk Stratification - 07/04/23 1334       Activity Barriers & Cardiac Risk Stratification   Activity Barriers Left Knee Replacement;Deconditioning;Muscular Weakness;Balance Concerns;Joint Problems;History of Falls;Shortness of Breath   needs R hip, three tears in rotator cuff R side   Cardiac Risk Stratification Moderate             6 Minute Walk:  6 Minute Walk     Row Name 07/04/23 1518         6 Minute Walk   Phase Initial     Distance 1330 feet     Walk Time 6 minutes      # of Rest Breaks 0     MPH 2.52     METS 2.5     RPE 8     VO2 Peak 8.74     Symptoms Yes (comment)     Comments L knee clicking like normal     Resting HR 63 bpm     Resting BP 104/56     Resting Oxygen Saturation  97 %     Exercise Oxygen Saturation  during 6 min walk 97 %     Max Ex. HR 112 bpm     Max Ex. BP 142/74     2 Minute Post BP 120/66              Oxygen Initial Assessment:   Oxygen Re-Evaluation:   Oxygen Discharge (Final Oxygen Re-Evaluation):   Initial Exercise Prescription:  Initial Exercise Prescription - 07/04/23 1500       Date of Initial Exercise RX and Referring Provider   Date 07/04/23    Referring Provider Croitoru, Mihai MD      Oxygen   Maintain Oxygen Saturation 88% or higher      Treadmill   MPH 2.5    Grade 0.5    Minutes 15    METs 3.09      REL-XR   Level 3    Speed 50    Minutes 15    METs 3      Prescription Details   Frequency (times per week) 2    Duration Progress to 30 minutes of continuous aerobic without signs/symptoms of physical distress      Intensity   THRR 40-80% of Max Heartrate 99-137    Ratings of Perceived Exertion 11-13    Perceived Dyspnea 0-4      Progression   Progression Continue to progress workloads to maintain intensity without signs/symptoms of physical distress.      Resistance Training   Training Prescription Yes    Weight 5 lb    Reps 10-15             Perform Capillary Blood Glucose checks as needed.  Exercise Prescription Changes:   Exercise Prescription Changes     Row Name 07/04/23 1500             Response to Exercise   Blood Pressure (Admit) 104/56       Blood Pressure (Exercise) 142/74       Blood Pressure (Exit) 120/66       Heart Rate (Admit) 63 bpm       Heart Rate (Exercise) 112 bpm  Heart Rate (Exit) 59 bpm       Oxygen Saturation (Admit) 97 %       Oxygen Saturation (Exercise) 97 %       Rating of Perceived Exertion (Exercise) 8        Symptoms L knee clicking like normal       Comments walk test results                Exercise Comments:   Exercise Comments     Row Name 07/16/23 1029           Exercise Comments First full day of exercise!  Patient was oriented to gym and equipment including functions, settings, policies, and procedures.  Patient's individual exercise prescription and treatment plan were reviewed.  All starting workloads were established based on the results of the 6 minute walk test done at initial orientation visit.  The plan for exercise progression was also introduced and progression will be customized based on patient's performance and goals.                Exercise Goals and Review:   Exercise Goals     Row Name 07/04/23 1521             Exercise Goals   Increase Physical Activity Yes       Intervention Develop an individualized exercise prescription for aerobic and resistive training based on initial evaluation findings, risk stratification, comorbidities and participant's personal goals.;Provide advice, education, support and counseling about physical activity/exercise needs.       Expected Outcomes Short Term: Attend rehab on a regular basis to increase amount of physical activity.;Long Term: Add in home exercise to make exercise part of routine and to increase amount of physical activity.;Long Term: Exercising regularly at least 3-5 days a week.       Increase Strength and Stamina Yes       Intervention Provide advice, education, support and counseling about physical activity/exercise needs.;Develop an individualized exercise prescription for aerobic and resistive training based on initial evaluation findings, risk stratification, comorbidities and participant's personal goals.       Expected Outcomes Short Term: Increase workloads from initial exercise prescription for resistance, speed, and METs.;Short Term: Perform resistance training exercises routinely during rehab and add in  resistance training at home;Long Term: Improve cardiorespiratory fitness, muscular endurance and strength as measured by increased METs and functional capacity ( )       Able to understand and use rate of perceived exertion (RPE) scale Yes       Intervention Provide education and explanation on how to use RPE scale       Expected Outcomes Short Term: Able to use RPE daily in rehab to express subjective intensity level;Long Term:  Able to use RPE to guide intensity level when exercising independently       Able to understand and use Dyspnea scale Yes       Intervention Provide education and explanation on how to use Dyspnea scale       Expected Outcomes Short Term: Able to use Dyspnea scale daily in rehab to express subjective sense of shortness of breath during exertion;Long Term: Able to use Dyspnea scale to guide intensity level when exercising independently       Knowledge and understanding of Target Heart Rate Range (THRR) Yes       Intervention Provide education and explanation of THRR including how the numbers were predicted and where they are located for reference  Expected Outcomes Short Term: Able to state/look up THRR;Short Term: Able to use daily as guideline for intensity in rehab;Long Term: Able to use THRR to govern intensity when exercising independently       Able to check pulse independently Yes       Intervention Provide education and demonstration on how to check pulse in carotid and radial arteries.;Review the importance of being able to check your own pulse for safety during independent exercise       Expected Outcomes Short Term: Able to explain why pulse checking is important during independent exercise;Long Term: Able to check pulse independently and accurately       Understanding of Exercise Prescription Yes       Intervention Provide education, explanation, and written materials on patient's individual exercise prescription       Expected Outcomes Short Term: Able to  explain program exercise prescription;Long Term: Able to explain home exercise prescription to exercise independently                Exercise Goals Re-Evaluation :  Exercise Goals Re-Evaluation     Row Name 07/16/23 1029             Exercise Goal Re-Evaluation   Exercise Goals Review Able to understand and use rate of perceived exertion (RPE) scale;Knowledge and understanding of Target Heart Rate Range (THRR);Able to understand and use Dyspnea scale;Understanding of Exercise Prescription       Comments Reviewed RPE and dyspnea scale, THR and program prescription with pt today.  Pt voiced understanding and was given a copy of goals to take home.       Expected Outcomes Short: Use RPE daily to regulate intensity.  Long: Follow program prescription in THR.                 Discharge Exercise Prescription (Final Exercise Prescription Changes):  Exercise Prescription Changes - 07/04/23 1500       Response to Exercise   Blood Pressure (Admit) 104/56    Blood Pressure (Exercise) 142/74    Blood Pressure (Exit) 120/66    Heart Rate (Admit) 63 bpm    Heart Rate (Exercise) 112 bpm    Heart Rate (Exit) 59 bpm    Oxygen Saturation (Admit) 97 %    Oxygen Saturation (Exercise) 97 %    Rating of Perceived Exertion (Exercise) 8    Symptoms L knee clicking like normal    Comments walk test results             Nutrition:  Target Goals: Understanding of nutrition guidelines, daily intake of sodium 1500mg , cholesterol 200mg , calories 30% from fat and 7% or less from saturated fats, daily to have 5 or more servings of fruits and vegetables.  Biometrics:  Pre Biometrics - 07/04/23 1522       Pre Biometrics   Height 5' 11.5" (1.816 m)    Weight 311 lb 6.4 oz (141.3 kg)    Waist Circumference 49.5 inches    Hip Circumference 45 inches    Waist to Hip Ratio 1.1 %    BMI (Calculated) 42.83    Grip Strength 43.9 kg    Single Leg Stand 30 seconds               Nutrition Therapy Plan and Nutrition Goals:  Nutrition Therapy & Goals - 07/04/23 1533       Intervention Plan   Intervention Prescribe, educate and counsel regarding individualized specific dietary modifications aiming towards targeted core  components such as weight, hypertension, lipid management, diabetes, heart failure and other comorbidities.;Nutrition handout(s) given to patient.    Expected Outcomes Short Term Goal: Understand basic principles of dietary content, such as calories, fat, sodium, cholesterol and nutrients.;Long Term Goal: Adherence to prescribed nutrition plan.             Nutrition Assessments:  MEDIFICTS Score Key: >=70 Need to make dietary changes  40-70 Heart Healthy Diet <= 40 Therapeutic Level Cholesterol Diet  Flowsheet Row CARDIAC REHAB PHASE II ORIENTATION from 07/04/2023 in [facility] CARDIAC REHABILITATION  Picture Your Plate Total Score on Admission 50      Picture Your Plate Scores: <40 Unhealthy dietary pattern with much room for improvement. 41-50 Dietary pattern unlikely to meet recommendations for good health and room for improvement. 51-60 More healthful dietary pattern, with some room for improvement.  >60 Healthy dietary pattern, although there may be some specific behaviors that could be improved.    Nutrition Goals Re-Evaluation:   Nutrition Goals Discharge (Final Nutrition Goals Re-Evaluation):   Psychosocial: Target Goals: Acknowledge presence or absence of significant depression and/or stress, maximize coping skills, provide positive support system. Participant is able to verbalize types and ability to use techniques and skills needed for reducing stress and depression.  Initial Review & Psychosocial Screening:  Initial Psych Review & Screening - 07/04/23 1337       Initial Review   Current issues with Current Stress Concerns;Current Sleep Concerns    Source of Stress Concerns Chronic Illness;Unable to participate in  former interests or hobbies;Unable to perform yard/household activities    Comments four different hospital visits, just got COVID in Sept, toss and turn at night with hip      Family Dynamics   Good Support System? Yes   wife, two sons and wives, 4 grandkids, two sisters     Barriers   Psychosocial barriers to participate in program The patient should benefit from training in stress management and relaxation.;Psychosocial barriers identified (see note)      Screening Interventions   Interventions Encouraged to exercise;Provide feedback about the scores to participant;To provide support and resources with identified psychosocial needs    Expected Outcomes Short Term goal: Utilizing psychosocial counselor, staff and physician to assist with identification of specific Stressors or current issues interfering with healing process. Setting desired goal for each stressor or current issue identified.;Long Term Goal: Stressors or current issues are controlled or eliminated.;Short Term goal: Identification and review with participant of any Quality of Life or Depression concerns found by scoring the questionnaire.;Long Term goal: The participant improves quality of Life and PHQ9 Scores as seen by post scores and/or verbalization of changes             Quality of Life Scores:  Quality of Life - 07/04/23 1533       Quality of Life   Select Quality of Life      Quality of Life Scores   Health/Function Pre 13.37 %    Socioeconomic Pre 25.71 %    Psych/Spiritual Pre 20.21 %    Family Pre 26.4 %    GLOBAL Pre 19.24 %            Scores of 19 and below usually indicate a poorer quality of life in these areas.  A difference of  2-3 points is a clinically meaningful difference.  A difference of 2-3 points in the total score of the Quality of Life Index has been associated with significant improvement  in overall quality of life, self-image, physical symptoms, and general health in studies assessing  change in quality of life.  PHQ-9: Review Flowsheet       07/04/2023 01/31/2023  Depression screen PHQ 2/9  Decreased Interest - 0  Down, Depressed, Hopeless 0 0  PHQ - 2 Score 0 0  Altered sleeping 2 -  Tired, decreased energy 3 -  Change in appetite 2 -  Feeling bad or failure about yourself  0 -  Trouble concentrating 0 -  Moving slowly or fidgety/restless 0 -  Suicidal thoughts 0 -  PHQ-9 Score 7 -  Difficult doing work/chores Somewhat difficult -   Interpretation of Total Score  Total Score Depression Severity:  1-4 = Minimal depression, 5-9 = Mild depression, 10-14 = Moderate depression, 15-19 = Moderately severe depression, 20-27 = Severe depression   Psychosocial Evaluation and Intervention:  Psychosocial Evaluation - 07/04/23 1522       Psychosocial Evaluation & Interventions   Interventions Encouraged to exercise with the program and follow exercise prescription;Stress management education    Comments Kathlene November is coming into cardiac rehab after a stent.  He has done rehab before twenty years ago.  He had a rough go this time with his stent as they had a hard time getting stent and wire in to cross and clean with CTO.  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  He has a knee replacement and a hip that needs to be replaced that limit his mobility.  He also has a rotator cuff tear in his R shoulder.  Any bending or over head activites make his feel like he is working harder.  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  He is doing well overall and denies any other major stressors besides his health.  His wife is his primary support system but they have two married sons with 4 grandkids they enjoy.  [place] and [place].  He does not sleep well with his hip as the pain wakes him at night and we talked about trying at pillow between  his knees to help.  He was willing to try it.  He does not precieve any barriers to attending other than [place]  We asked him to just let us know when those days would be.  He will start on 1/14 as [place].  He wants to get stronger and get back to kayaking again.    Expected Outcomes Short: Attend rehab to build up strength and stamina Long: Continue to stay positive    Continue Psychosocial Services  Follow up required by staff             Psychosocial Re-Evaluation:   Psychosocial Discharge (Final Psychosocial Re-Evaluation):   Vocational Rehabilitation: Provide vocational rehab assistance to qualifying candidates.   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 Participant will state understanding/return demonstration of topics presented.  Learning Barriers/Preferences:  Learning Barriers/Preferences -  07/04/23 1341       Learning Barriers/Preferences   Learning Barriers Sight   getting cataract surgery on 1/16   Learning Preferences Skilled Demonstration             Education Topics: Hypertension, Hypertension Reduction -Define heart disease and high blood pressure. Discus how high blood pressure affects the body and ways to reduce high blood pressure.   Exercise and Your Heart -Discuss why it is important to exercise, the FITT principles of exercise, normal and abnormal responses to exercise, and how to exercise safely.   Angina -Discuss definition of angina, causes of angina, treatment of angina, and how to decrease risk of having angina.   Cardiac Medications -Review what the following cardiac  medications are used for, how they affect the body, and side effects that may occur when taking the medications.  Medications include Aspirin, Beta blockers, calcium channel blockers, ACE Inhibitors, angiotensin receptor blockers, diuretics, digoxin, and antihyperlipidemics.   Congestive Heart Failure -Discuss the definition of CHF, how to live with CHF, the signs and symptoms of CHF, and how keep track of weight and sodium intake.   Heart Disease and Intimacy -Discus the effect sexual activity has on the heart, how changes occur during intimacy as we age, and safety during sexual activity.   Smoking Cessation / COPD -Discuss different methods to quit smoking, the health benefits of quitting smoking, and the definition of COPD.   Nutrition I: Fats -Discuss the types of cholesterol, what cholesterol does to the heart, and how cholesterol levels can be controlled.   Nutrition II: Labels -Discuss the different components of food labels and how to read food label   Heart Parts/Heart Disease and PAD -Discuss the anatomy of the heart, the pathway of blood circulation through the heart, and these are affected by heart disease.   Stress I: Signs and Symptoms -Discuss the causes of stress, how stress may lead to anxiety and depression, and ways to limit stress.   Stress II: Relaxation -Discuss different types of relaxation techniques to limit stress.   Warning Signs of Stroke / TIA -Discuss definition of a stroke, what the signs and symptoms are of a stroke, and how to identify when someone is having stroke.   Knowledge Questionnaire Score:  Knowledge Questionnaire Score - 07/04/23 1534       Knowledge Questionnaire Score   Pre Score 21/24             Core Components/Risk Factors/Patient Goals at Admission:  Personal Goals and Risk Factors at Admission - 07/04/23 1534       Core Components/Risk Factors/Patient Goals on Admission    Weight Management Yes;Obesity;Weight  Loss    Intervention Weight Management: Provide education and appropriate resources to help participant work on and attain dietary goals.;Weight Management: Develop a combined nutrition and exercise program designed to reach desired caloric intake, while maintaining appropriate intake of nutrient and fiber, sodium and fats, and appropriate energy expenditure required for the weight goal.;Weight Management/Obesity: Establish reasonable short term and long term weight goals.;Obesity: Provide education and appropriate resources to help participant work on and attain dietary goals.    Admit Weight 311 lb 6.4 oz (141.3 kg)    Goal Weight: Short Term 305 lb (138.3 kg)    Goal Weight: Long Term 300 lb (136.1 kg)    Expected Outcomes Short Term: Continue to assess and modify interventions until short term weight is achieved;Long Term: Adherence to nutrition and physical activity/exercise program aimed toward attainment  of established weight goal;Weight Loss: Understanding of general recommendations for a balanced deficit meal plan, which promotes 1-2 lb weight loss per week and includes a negative energy balance of [phone] kcal/d;Understanding recommendations for meals to include 15-35% energy as protein, 25-35% energy from fat, 35-60% energy from carbohydrates, less than 200mg  of dietary cholesterol, 20-35 gm of total fiber daily;Understanding of distribution of calorie intake throughout the day with the consumption of 4-5 meals/snacks    Hypertension Yes    Intervention Provide education on lifestyle modifcations including regular physical activity/exercise, weight management, moderate sodium restriction and increased consumption of fresh fruit, vegetables, and low fat dairy, alcohol moderation, and smoking cessation.;Monitor prescription use compliance.    Expected Outcomes Short Term: Continued assessment and intervention until BP is < 140/87mm HG in hypertensive participants. < 130/39mm HG in hypertensive  participants with diabetes, heart failure or chronic kidney disease.;Long Term: Maintenance of blood pressure at goal levels.    Lipids Yes    Intervention Provide education and support for participant on nutrition & aerobic/resistive exercise along with prescribed medications to achieve LDL 70mg , HDL >40mg .    Expected Outcomes Short Term: Participant states understanding of desired cholesterol values and is compliant with medications prescribed. Participant is following exercise prescription and nutrition guidelines.;Long Term: Cholesterol controlled with medications as prescribed, with individualized exercise RX and with personalized nutrition plan. Value goals: LDL < 70mg , HDL > 40 mg.             Core Components/Risk Factors/Patient Goals Review:    Core Components/Risk Factors/Patient Goals at Discharge (Final Review):    ITP Comments:  ITP Comments     Row Name 07/04/23 1517 07/16/23 1029 07/31/23 1123 08/28/23 0856     ITP Comments Patient attend orientation today.  Patient is attending Cardiac Rehabilitation Program.  Documentation for diagnosis can be found in 01/31/23.  Reviewed medical chart, RPE/RPD, gym safety, and program guidelines.  Patient was fitted to equipment they will be using during rehab.  Patient is scheduled to start exercise on Tuesday 07/16/23 at 1030.   Initial ITP created and sent for review and signature by Dr. Dina Rich, Medical Director for Cardiac Rehabilitation Program. First full day of exercise!  Patient was oriented to gym and equipment including functions, settings, policies, and procedures.  Patient's individual exercise prescription and treatment plan were reviewed.  All starting workloads were established based on the results of the 6 minute walk test done at initial orientation visit.  The plan for exercise progression was also introduced and progression will be customized based on patient's performance and goals. 30 day review completed. ITP sent  to Dr. Dina Rich, Medical Director of Cardiac Rehab. Continue with ITP unless changes are made by physician.  Newer to program.  Currently out with cataract surgery 30 day review completed. ITP sent to Dr. Dina Rich, Medical Director of Cardiac Rehab. Continue with ITP unless changes are made by physician.  Kathlene November has not returned since his cataract surgery and we have left messages.             Comments: 30 day review

## 2023-08-29 ENCOUNTER — Encounter (HOSPITAL_COMMUNITY): Payer: Medicare Other

## 2023-09-03 ENCOUNTER — Encounter (HOSPITAL_COMMUNITY): Payer: Medicare Other

## 2023-09-03 DIAGNOSIS — I252 Old myocardial infarction: Secondary | ICD-10-CM | POA: Diagnosis not present

## 2023-09-03 DIAGNOSIS — I25118 Atherosclerotic heart disease of native coronary artery with other forms of angina pectoris: Secondary | ICD-10-CM | POA: Diagnosis not present

## 2023-09-03 DIAGNOSIS — E782 Mixed hyperlipidemia: Secondary | ICD-10-CM | POA: Diagnosis not present

## 2023-09-03 DIAGNOSIS — I10 Essential (primary) hypertension: Secondary | ICD-10-CM | POA: Diagnosis not present

## 2023-09-05 ENCOUNTER — Encounter (HOSPITAL_COMMUNITY): Payer: Medicare Other

## 2023-09-06 ENCOUNTER — Ambulatory Visit: Admitting: Family Medicine

## 2023-09-06 ENCOUNTER — Other Ambulatory Visit: Payer: Self-pay

## 2023-09-06 ENCOUNTER — Encounter: Payer: Self-pay | Admitting: Family Medicine

## 2023-09-06 VITALS — BP 118/72 | HR 64 | Ht 73.0 in | Wt 315.0 lb

## 2023-09-06 DIAGNOSIS — G8929 Other chronic pain: Secondary | ICD-10-CM

## 2023-09-06 DIAGNOSIS — M25551 Pain in right hip: Secondary | ICD-10-CM | POA: Diagnosis not present

## 2023-09-06 NOTE — Patient Instructions (Addendum)
 Thank you for coming in today.  You received an injection today. Seek immediate medical attention if the joint becomes red, extremely painful, or is oozing fluid.  See you back in 3 months.

## 2023-09-06 NOTE — Progress Notes (Signed)
 I, Stevenson Clinch, CMA acting as a scribe for [name], MD.  Craig Copeland is a 68 y.o. male who presents to [facility] at [facility] today for exacerbation of his R hip pain. Pt was last seen by Dr. Denyse Amass on 12/05/22 and was given a R inter-articular hip injection  Today, pt reports he never ended up having a hip replacement, as he was dealing w/ some heart issues. R hip pain returned about 8 months ago. Got about 2.5 months of relief after last injection.  Pt locates pain to entire hip joint. Also having some pain in the left hip. Denies radiating pain into gluteal region or groin. Minimal pain into the leg. Some weakness in the left leg. Denies lower back pain. Tylenol PM helps at bedtime. Sx cause night disturbance when rolling over in bed.   He has to wait until least August to have a hip replacement.  He had a cath with a drug-eluting stent and will need to wait a year.  Dx testing: 05/16/22 R hip XR  Pertinent review of systems: No fevers or chills  Relevant historical information: Coronary artery disease and obesity.  BMI 41.56   Exam:  BP 118/72   Pulse 64   Ht 6\' 1"  (1.854 m)   Wt (!) 315 lb (142.9 kg)   SpO2 96%   BMI 41.56 kg/m  General: Well Developed, well nourished, and in no acute distress.   MSK: Right hip normal-appearing decreased range of motion.    Lab and Radiology Results  Procedure: Real-time Ultrasound Guided Injection of right anterior hip femoral acetabular joint Device: Philips Affiniti 50G/GE Logiq Images permanently stored and available for review in PACS Verbal informed consent obtained.  Discussed risks and benefits of procedure. Warned about infection, bleeding, hyperglycemia damage to structures among others. Patient expresses understanding and agreement Time-out conducted.   Noted no overlying erythema, induration, or other signs of local infection.   Skin prepped in a sterile fashion.   Local anesthesia: Topical  Ethyl chloride.   With sterile technique and under real time ultrasound guidance spinal needle was advanced to the hip joint capsule.  However I was unable to visualize the needle despite redirect.  I decided to remove the needle and temporarily pause or discontinue the procedure.  I gave him an option of discontinuing entirely and asking radiology to try this injection under fluoroscopy or attempting again.  He elected to reattempt ReSound guided injection.  The skin was again sterilized with isopropyl alcohol. This time I was able to visualize the needle and feel touchdown under the femoral neck within the joint capsule.  40 mg of Kenalog and 2 ml of Marcaine injected into hip joint. Fluid seen entering the hip joint.   Completed without difficulty   Pain immediately resolved suggesting accurate placement of the medication.   Advised to call if fevers/chills, erythema, induration, drainage, or persistent bleeding.   Images permanently stored and available for review in the ultrasound unit.  Impression: Technically successful ultrasound guided injection.   CT images of the hip obtained during CT scan abdomen and pelvis July 2024 personally independently interpreted.  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   Assessment and Plan: 68 y.o. 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  Patient had pretty good results with previous hip injection in June of last year.  Plan for repeat intra-articular cortisone injection today.  Could proceed with left hip injection whenever he would like 1.  Anticipate hip replacement surgery sometime in September.  Likely will repeat injection in June.   PDMP not reviewed this encounter. Orders Placed This Encounter  Procedures   [place] LIMITED JOINT SPACE STRUCTURES LOW RIGHT(NO LINKED CHARGES)    Reason for Exam (SYMPTOM  OR DIAGNOSIS REQUIRED):   right hip pain    Preferred imaging location?:   [facility]   No orders of the defined  types were placed in this encounter.    Discussed warning signs or symptoms. Please see discharge instructions. Patient expresses understanding.   The above documentation has been reviewed and is accurate and complete [name], M.D.

## 2023-09-10 ENCOUNTER — Encounter (HOSPITAL_COMMUNITY): Payer: Medicare Other

## 2023-09-12 ENCOUNTER — Encounter (HOSPITAL_COMMUNITY): Payer: Medicare Other

## 2023-09-17 ENCOUNTER — Encounter (HOSPITAL_COMMUNITY)
Admission: RE | Admit: 2023-09-17 | Discharge: 2023-09-17 | Disposition: A | Discharge status: Home / Self Care | Payer: Medicare Other | Source: Ambulatory Visit | Attending: Cardiovascular Disease | Admitting: Cardiovascular Disease

## 2023-09-17 DIAGNOSIS — Z955 Presence of coronary angioplasty implant and graft: Secondary | ICD-10-CM | POA: Insufficient documentation

## 2023-09-17 NOTE — Progress Notes (Signed)
 Cardiac Individual Treatment Plan  Patient Details  Name: Craig Copeland MRN: [id number] Date of Birth: [birth year] Referring Provider:   Flowsheet Row CARDIAC REHAB PHASE II ORIENTATION from 07/04/2023 in [facility] CARDIAC REHABILITATION  Referring Provider Croitoru, Rachelle Hora MD       Initial Encounter Date:  Flowsheet Row CARDIAC REHAB PHASE II ORIENTATION from 07/04/2023 in [place] CARDIAC REHABILITATION  Date 07/04/23       Visit Diagnosis: No diagnosis found.  Patient's Home Medications on Admission:  Current Outpatient Medications:    acetaminophen (TYLENOL) 500 MG tablet, Take 500 mg by mouth as needed for moderate pain., Disp: , Rfl:    amLODipine (NORVASC) 5 MG tablet, TAKE 1 TABLET BY MOUTH EVERY DAY IN THE EVENING, Disp: 90 tablet, Rfl: 3   aspirin EC 81 MG tablet, Take 81 mg by mouth daily., Disp: , Rfl:    carvedilol (COREG) 6.25 MG tablet, Take 1 tablet (6.25 mg total) by mouth 2 (two) times daily., Disp: 180 tablet, Rfl: 3   cholecalciferol (VITAMIN D) 1000 units tablet, Take 1,000 Units by mouth daily., Disp: , Rfl:    clopidogrel (PLAVIX) 75 MG tablet, Take 75 mg by mouth daily., Disp: , Rfl:    ezetimibe (ZETIA) 10 MG tablet, Take 10 mg by mouth every evening. , Disp: , Rfl:    fish oil-omega-3 fatty acids 1000 MG capsule, Take 1 g by mouth daily. , Disp: , Rfl:    isosorbide mononitrate (IMDUR) 30 MG 24 hr tablet, Take 1 tablet (30 mg total) by mouth daily., Disp: 90 tablet, Rfl: 3   losartan (COZAAR) 100 MG tablet, Take 0.5 tablets (50 mg total) by mouth daily., Disp: 90 tablet, Rfl: 3   nitroGLYCERIN (NITROSTAT) 0.4 MG SL tablet, PLACE 1 TABLET UNDER THE TONGUE EVERY 5 MINUTES AS NEEDED FOR CHEST PAIN., Disp: 25 tablet, Rfl: 2   pravastatin (PRAVACHOL) 80 MG tablet, Take 80 mg by mouth at bedtime. , Disp: , Rfl:    ranolazine (RANEXA) 1000 MG SR tablet, Take 1 tablet (1,000 mg total) by mouth 2 (two) times daily., Disp: 60 tablet, Rfl: 0   WEGOVY 0.25 MG/0.5ML  SOAJ, , Disp: , Rfl:   Past Medical History: Past Medical History:  Diagnosis Date   CAD (coronary artery disease)    Dizziness    Dyslipidemia    Flu 06/2016   Had again 01/208 with PNA   Obesity    OSA (obstructive sleep apnea)    Systemic hypertension    Vasospastic angina (HCC)     Tobacco Use: Social History   Tobacco Use  Smoking Status Never  Smokeless Tobacco Never    Labs: Review Flowsheet       Latest Ref Rng & Units 12/24/2013 04/08/2015 01/31/2023  Labs for ITP Cardiac and Pulmonary Rehab  Cholestrol 0 - 200 mg/dL - 956  213   LDL (calc) 0 - 99 mg/dL - 74  61   HDL-C >08 mg/dL - 32  39   Trlycerides <150 mg/dL - 657  81   Hemoglobin A1c 4.8 - 5.6 % 5.6  - -    Capillary Blood Glucose: Lab Results  Component Value Date   GLUCAP 188 (H) 01/31/2023     Exercise Target Goals: Exercise Program Goal: Individual exercise prescription set using results from initial 6 min walk test and THRR while considering  patient's activity barriers and safety.   Exercise Prescription Goal: Starting with aerobic activity 30 plus minutes a day, 3 days  per week for initial exercise prescription. Provide home exercise prescription and guidelines that participant acknowledges understanding prior to discharge.  Activity Barriers & Risk Stratification:  Activity Barriers & Cardiac Risk Stratification - 07/04/23 1334       Activity Barriers & Cardiac Risk Stratification   Activity Barriers Left Knee Replacement;Deconditioning;Muscular Weakness;Balance Concerns;Joint Problems;History of Falls;Shortness of Breath   needs R hip, three tears in rotator cuff R side   Cardiac Risk Stratification Moderate             6 Minute Walk:  6 Minute Walk     Row Name 07/04/23 1518         6 Minute Walk   Phase Initial     Distance 1330 feet     Walk Time 6 minutes     # of Rest Breaks 0     MPH 2.52     METS 2.5     RPE 8     VO2 Peak 8.74     Symptoms Yes (comment)      Comments L knee clicking like normal     Resting HR 63 bpm     Resting BP 104/56     Resting Oxygen Saturation  97 %     Exercise Oxygen Saturation  during 6 min walk 97 %     Max Ex. HR 112 bpm     Max Ex. BP 142/74     2 Minute Post BP 120/66              Oxygen Initial Assessment:   Oxygen Re-Evaluation:   Oxygen Discharge (Final Oxygen Re-Evaluation):   Initial Exercise Prescription:  Initial Exercise Prescription - 07/04/23 1500       Date of Initial Exercise RX and Referring Provider   Date 07/04/23    Referring Provider Croitoru, Mihai MD      Oxygen   Maintain Oxygen Saturation 88% or higher      Treadmill   MPH 2.5    Grade 0.5    Minutes 15    METs 3.09      REL-XR   Level 3    Speed 50    Minutes 15    METs 3      Prescription Details   Frequency (times per week) 2    Duration Progress to 30 minutes of continuous aerobic without signs/symptoms of physical distress      Intensity   THRR 40-80% of Max Heartrate 99-137    Ratings of Perceived Exertion 11-13    Perceived Dyspnea 0-4      Progression   Progression Continue to progress workloads to maintain intensity without signs/symptoms of physical distress.      Resistance Training   Training Prescription Yes    Weight 5 lb    Reps 10-15             Perform Capillary Blood Glucose checks as needed.  Exercise Prescription Changes:   Exercise Prescription Changes     Row Name 07/04/23 1500             Response to Exercise   Blood Pressure (Admit) 104/56       Blood Pressure (Exercise) 142/74       Blood Pressure (Exit) 120/66       Heart Rate (Admit) 63 bpm       Heart Rate (Exercise) 112 bpm       Heart Rate (Exit) 59 bpm  Oxygen Saturation (Admit) 97 %       Oxygen Saturation (Exercise) 97 %       Rating of Perceived Exertion (Exercise) 8       Symptoms L knee clicking like normal       Comments walk test results                Exercise Comments:    Exercise Comments     Row Name 07/16/23 1029           Exercise Comments First full day of exercise!  Patient was oriented to gym and equipment including functions, settings, policies, and procedures.  Patient's individual exercise prescription and treatment plan were reviewed.  All starting workloads were established based on the results of the 6 minute walk test done at initial orientation visit.  The plan for exercise progression was also introduced and progression will be customized based on patient's performance and goals.                Exercise Goals and Review:   Exercise Goals     Row Name 07/04/23 1521             Exercise Goals   Increase Physical Activity Yes       Intervention Develop an individualized exercise prescription for aerobic and resistive training based on initial evaluation findings, risk stratification, comorbidities and participant's personal goals.;Provide advice, education, support and counseling about physical activity/exercise needs.       Expected Outcomes Short Term: Attend rehab on a regular basis to increase amount of physical activity.;Long Term: Add in home exercise to make exercise part of routine and to increase amount of physical activity.;Long Term: Exercising regularly at least 3-5 days a week.       Increase Strength and Stamina Yes       Intervention Provide advice, education, support and counseling about physical activity/exercise needs.;Develop an individualized exercise prescription for aerobic and resistive training based on initial evaluation findings, risk stratification, comorbidities and participant's personal goals.       Expected Outcomes Short Term: Increase workloads from initial exercise prescription for resistance, speed, and METs.;Short Term: Perform resistance training exercises routinely during rehab and add in resistance training at home;Long Term: Improve cardiorespiratory fitness, muscular endurance and strength as  measured by increased METs and functional capacity ( )       Able to understand and use rate of perceived exertion (RPE) scale Yes       Intervention Provide education and explanation on how to use RPE scale       Expected Outcomes Short Term: Able to use RPE daily in rehab to express subjective intensity level;Long Term:  Able to use RPE to guide intensity level when exercising independently       Able to understand and use Dyspnea scale Yes       Intervention Provide education and explanation on how to use Dyspnea scale       Expected Outcomes Short Term: Able to use Dyspnea scale daily in rehab to express subjective sense of shortness of breath during exertion;Long Term: Able to use Dyspnea scale to guide intensity level when exercising independently       Knowledge and understanding of Target Heart Rate Range (THRR) Yes       Intervention Provide education and explanation of THRR including how the numbers were predicted and where they are located for reference       Expected Outcomes Short Term: Able to state/look  up THRR;Short Term: Able to use daily as guideline for intensity in rehab;Long Term: Able to use THRR to govern intensity when exercising independently       Able to check pulse independently Yes       Intervention Provide education and demonstration on how to check pulse in carotid and radial arteries.;Review the importance of being able to check your own pulse for safety during independent exercise       Expected Outcomes Short Term: Able to explain why pulse checking is important during independent exercise;Long Term: Able to check pulse independently and accurately       Understanding of Exercise Prescription Yes       Intervention Provide education, explanation, and written materials on patient's individual exercise prescription       Expected Outcomes Short Term: Able to explain program exercise prescription;Long Term: Able to explain home exercise prescription to exercise  independently                Exercise Goals Re-Evaluation :  Exercise Goals Re-Evaluation     Row Name 07/16/23 1029             Exercise Goal Re-Evaluation   Exercise Goals Review Able to understand and use rate of perceived exertion (RPE) scale;Knowledge and understanding of Target Heart Rate Range (THRR);Able to understand and use Dyspnea scale;Understanding of Exercise Prescription       Comments Reviewed RPE and dyspnea scale, THR and program prescription with pt today.  Pt voiced understanding and was given a copy of goals to take home.       Expected Outcomes Short: Use RPE daily to regulate intensity.  Long: Follow program prescription in THR.                 Discharge Exercise Prescription (Final Exercise Prescription Changes):  Exercise Prescription Changes - 07/04/23 1500       Response to Exercise   Blood Pressure (Admit) 104/56    Blood Pressure (Exercise) 142/74    Blood Pressure (Exit) 120/66    Heart Rate (Admit) 63 bpm    Heart Rate (Exercise) 112 bpm    Heart Rate (Exit) 59 bpm    Oxygen Saturation (Admit) 97 %    Oxygen Saturation (Exercise) 97 %    Rating of Perceived Exertion (Exercise) 8    Symptoms L knee clicking like normal    Comments walk test results             Nutrition:  Target Goals: Understanding of nutrition guidelines, daily intake of sodium 1500mg , cholesterol 200mg , calories 30% from fat and 7% or less from saturated fats, daily to have 5 or more servings of fruits and vegetables.  Biometrics:  Pre Biometrics - 07/04/23 1522       Pre Biometrics   Height 5' 11.5" (1.816 m)    Weight 141.3 kg    Waist Circumference 49.5 inches    Hip Circumference 45 inches    Waist to Hip Ratio 1.1 %    BMI (Calculated) 42.83    Grip Strength 43.9 kg    Single Leg Stand 30 seconds              Nutrition Therapy Plan and Nutrition Goals:  Nutrition Therapy & Goals - 07/04/23 1533       Intervention Plan    Intervention Prescribe, educate and counsel regarding individualized specific dietary modifications aiming towards targeted core components such as weight, hypertension, lipid management, diabetes, heart failure and  other comorbidities.;Nutrition handout(s) given to patient.    Expected Outcomes Short Term Goal: Understand basic principles of dietary content, such as calories, fat, sodium, cholesterol and nutrients.;Long Term Goal: Adherence to prescribed nutrition plan.             Nutrition Assessments:  MEDIFICTS Score Key: >=70 Need to make dietary changes  40-70 Heart Healthy Diet <= 40 Therapeutic Level Cholesterol Diet  Flowsheet Row CARDIAC REHAB PHASE II ORIENTATION from 07/04/2023 in [facility] CARDIAC REHABILITATION  Picture Your Plate Total Score on Admission 50      Picture Your Plate Scores: <16 Unhealthy dietary pattern with much room for improvement. 41-50 Dietary pattern unlikely to meet recommendations for good health and room for improvement. 51-60 More healthful dietary pattern, with some room for improvement.  >60 Healthy dietary pattern, although there may be some specific behaviors that could be improved.    Nutrition Goals Re-Evaluation:   Nutrition Goals Discharge (Final Nutrition Goals Re-Evaluation):   Psychosocial: Target Goals: Acknowledge presence or absence of significant depression and/or stress, maximize coping skills, provide positive support system. Participant is able to verbalize types and ability to use techniques and skills needed for reducing stress and depression.  Initial Review & Psychosocial Screening:  Initial Psych Review & Screening - 07/04/23 1337       Initial Review   Current issues with Current Stress Concerns;Current Sleep Concerns    Source of Stress Concerns Chronic Illness;Unable to participate in former interests or hobbies;Unable to perform yard/household activities    Comments four different hospital visits, just got  COVID in Sept, toss and turn at night with hip      Family Dynamics   Good Support System? Yes   wife, two sons and wives, 4 grandkids, two sisters     Barriers   Psychosocial barriers to participate in program The patient should benefit from training in stress management and relaxation.;Psychosocial barriers identified (see note)      Screening Interventions   Interventions Encouraged to exercise;Provide feedback about the scores to participant;To provide support and resources with identified psychosocial needs    Expected Outcomes Short Term goal: Utilizing psychosocial counselor, staff and physician to assist with identification of specific Stressors or current issues interfering with healing process. Setting desired goal for each stressor or current issue identified.;Long Term Goal: Stressors or current issues are controlled or eliminated.;Short Term goal: Identification and review with participant of any Quality of Life or Depression concerns found by scoring the questionnaire.;Long Term goal: The participant improves quality of Life and PHQ9 Scores as seen by post scores and/or verbalization of changes             Quality of Life Scores:  Quality of Life - 07/04/23 1533       Quality of Life   Select Quality of Life      Quality of Life Scores   Health/Function Pre 13.37 %    Socioeconomic Pre 25.71 %    Psych/Spiritual Pre 20.21 %    Family Pre 26.4 %    GLOBAL Pre 19.24 %            Scores of 19 and below usually indicate a poorer quality of life in these areas.  A difference of  2-3 points is a clinically meaningful difference.  A difference of 2-3 points in the total score of the Quality of Life Index has been associated with significant improvement in overall quality of life, self-image, physical symptoms, and general health  in studies assessing change in quality of life.  PHQ-9: Review Flowsheet       07/04/2023 01/31/2023  Depression screen PHQ 2/9  Decreased  Interest - 0  Down, Depressed, Hopeless 0 0  PHQ - 2 Score 0 0  Altered sleeping 2 -  Tired, decreased energy 3 -  Change in appetite 2 -  Feeling bad or failure about yourself  0 -  Trouble concentrating 0 -  Moving slowly or fidgety/restless 0 -  Suicidal thoughts 0 -  PHQ-9 Score 7 -  Difficult doing work/chores Somewhat difficult -   Interpretation of Total Score  Total Score Depression Severity:  1-4 = Minimal depression, 5-9 = Mild depression, 10-14 = Moderate depression, 15-19 = Moderately severe depression, 20-27 = Severe depression   Psychosocial Evaluation and Intervention:  Psychosocial Evaluation - 07/04/23 1522       Psychosocial Evaluation & Interventions   Interventions Encouraged to exercise with the program and follow exercise prescription;Stress management education    Comments Kathlene November is coming into cardiac rehab after a stent.  He has done rehab before twenty years ago.  He had a rough go this time with his stent as they had a hard time getting stent and wire in to cross and clean with CTO.  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  He has a knee replacement and a hip that needs to be replaced that limit his mobility.  He also has a rotator cuff tear in his R shoulder.  Any bending or over head activites make his feel like he is working harder.  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  He is doing well overall and denies any other major stressors besides his health.  His wife is his primary support system but they have two married sons with 4 grandkids they enjoy.  [place] and [place].  He does not sleep well with his hip as the pain wakes him at night and we talked about trying at pillow between his knees to help.  He was willing to try it.  He does not precieve any barriers to attending other than [place]  We asked him to just let us know when those days would be.  He will start on 1/14 as [place].  He wants to get stronger and get back to kayaking again.    Expected Outcomes Short: Attend rehab to build up strength and stamina Long: Continue to stay positive    Continue Psychosocial Services  Follow up required by staff             Psychosocial Re-Evaluation:   Psychosocial Discharge (Final Psychosocial Re-Evaluation):   Vocational Rehabilitation: Provide vocational rehab assistance to qualifying candidates.   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 Participant will state understanding/return demonstration of topics presented.  Learning Barriers/Preferences:  Learning Barriers/Preferences - 07/04/23 1341       Learning Barriers/Preferences  Learning Barriers Sight   getting cataract surgery on 1/16   Learning Preferences Skilled Demonstration             Education Topics: Hypertension, Hypertension Reduction -Define heart disease and high blood pressure. Discus how high blood pressure affects the body and ways to reduce high blood pressure.   Exercise and Your Heart -Discuss why it is important to exercise, the FITT principles of exercise, normal and abnormal responses to exercise, and how to exercise safely.   Angina -Discuss definition of angina, causes of angina, treatment of angina, and how to decrease risk of having angina.   Cardiac Medications -Review what the following cardiac medications are used for, how they affect the body, and side effects that may occur when taking the medications.  Medications include  Aspirin, Beta blockers, calcium channel blockers, ACE Inhibitors, angiotensin receptor blockers, diuretics, digoxin, and antihyperlipidemics.   Congestive Heart Failure -Discuss the definition of CHF, how to live with CHF, the signs and symptoms of CHF, and how keep track of weight and sodium intake.   Heart Disease and Intimacy -Discus the effect sexual activity has on the heart, how changes occur during intimacy as we age, and safety during sexual activity.   Smoking Cessation / COPD -Discuss different methods to quit smoking, the health benefits of quitting smoking, and the definition of COPD.   Nutrition I: Fats -Discuss the types of cholesterol, what cholesterol does to the heart, and how cholesterol levels can be controlled.   Nutrition II: Labels -Discuss the different components of food labels and how to read food label   Heart Parts/Heart Disease and PAD -Discuss the anatomy of the heart, the pathway of blood circulation through the heart, and these are affected by heart disease.   Stress I: Signs and Symptoms -Discuss the causes of stress, how stress may lead to anxiety and depression, and ways to limit stress.   Stress II: Relaxation -Discuss different types of relaxation techniques to limit stress.   Warning Signs of Stroke / TIA -Discuss definition of a stroke, what the signs and symptoms are of a stroke, and how to identify when someone is having stroke.   Knowledge Questionnaire Score:  Knowledge Questionnaire Score - 07/04/23 1534       Knowledge Questionnaire Score   Pre Score 21/24             Core Components/Risk Factors/Patient Goals at Admission:  Personal Goals and Risk Factors at Admission - 07/04/23 1534       Core Components/Risk Factors/Patient Goals on Admission    Weight Management Yes;Obesity;Weight Loss    Intervention Weight Management: Provide education and appropriate resources to help participant work on and attain dietary  goals.;Weight Management: Develop a combined nutrition and exercise program designed to reach desired caloric intake, while maintaining appropriate intake of nutrient and fiber, sodium and fats, and appropriate energy expenditure required for the weight goal.;Weight Management/Obesity: Establish reasonable short term and long term weight goals.;Obesity: Provide education and appropriate resources to help participant work on and attain dietary goals.    Admit Weight 311 lb 6.4 oz (141.3 kg)    Goal Weight: Short Term 305 lb (138.3 kg)    Goal Weight: Long Term 300 lb (136.1 kg)    Expected Outcomes Short Term: Continue to assess and modify interventions until short term weight is achieved;Long Term: Adherence to nutrition and physical activity/exercise program aimed toward attainment of established weight goal;Weight Loss: Understanding of general recommendations for a balanced  deficit meal plan, which promotes 1-2 lb weight loss per week and includes a negative energy balance of [phone] kcal/d;Understanding recommendations for meals to include 15-35% energy as protein, 25-35% energy from fat, 35-60% energy from carbohydrates, less than 200mg  of dietary cholesterol, 20-35 gm of total fiber daily;Understanding of distribution of calorie intake throughout the day with the consumption of 4-5 meals/snacks    Hypertension Yes    Intervention Provide education on lifestyle modifcations including regular physical activity/exercise, weight management, moderate sodium restriction and increased consumption of fresh fruit, vegetables, and low fat dairy, alcohol moderation, and smoking cessation.;Monitor prescription use compliance.    Expected Outcomes Short Term: Continued assessment and intervention until BP is < 140/23mm HG in hypertensive participants. < 130/63mm HG in hypertensive participants with diabetes, heart failure or chronic kidney disease.;Long Term: Maintenance of blood pressure at goal levels.    Lipids  Yes    Intervention Provide education and support for participant on nutrition & aerobic/resistive exercise along with prescribed medications to achieve LDL 70mg , HDL >40mg .    Expected Outcomes Short Term: Participant states understanding of desired cholesterol values and is compliant with medications prescribed. Participant is following exercise prescription and nutrition guidelines.;Long Term: Cholesterol controlled with medications as prescribed, with individualized exercise RX and with personalized nutrition plan. Value goals: LDL < 70mg , HDL > 40 mg.             Core Components/Risk Factors/Patient Goals Review:    Core Components/Risk Factors/Patient Goals at Discharge (Final Review):    ITP Comments:  ITP Comments     Row Name 07/04/23 1517 07/16/23 1029 07/31/23 1123 08/28/23 0856     ITP Comments Patient attend orientation today.  Patient is attending Cardiac Rehabilitation Program.  Documentation for diagnosis can be found in 01/31/23.  Reviewed medical chart, RPE/RPD, gym safety, and program guidelines.  Patient was fitted to equipment they will be using during rehab.  Patient is scheduled to start exercise on Tuesday 07/16/23 at 1030.   Initial ITP created and sent for review and signature by Dr. Dina Rich, Medical Director for Cardiac Rehabilitation Program. First full day of exercise!  Patient was oriented to gym and equipment including functions, settings, policies, and procedures.  Patient's individual exercise prescription and treatment plan were reviewed.  All starting workloads were established based on the results of the 6 minute walk test done at initial orientation visit.  The plan for exercise progression was also introduced and progression will be customized based on patient's performance and goals. 30 day review completed. ITP sent to Dr. Dina Rich, Medical Director of Cardiac Rehab. Continue with ITP unless changes are made by physician.  Newer to program.   Currently out with cataract surgery 30 day review completed. ITP sent to Dr. Dina Rich, Medical Director of Cardiac Rehab. Continue with ITP unless changes are made by physician.  Kathlene November has not returned since his cataract surgery and we have left messages.             Comments: Discharge ITP

## 2023-09-17 NOTE — Progress Notes (Signed)
 Discharge Progress Report  Patient Details  Name: Craig Copeland MRN: [id number] Date of Birth: 1956/05/31 Referring Provider:   Flowsheet Row CARDIAC REHAB PHASE II ORIENTATION from 07/04/2023 in [facility] CARDIAC REHABILITATION  Referring Provider Croitoru, Mihai MD        Number of Visits: 2  Reason for Discharge:  Early Exit:  Lack of attendance   Diagnosis:  No diagnosis found.  ADL UCSD:   Initial Exercise Prescription:  Initial Exercise Prescription - 07/04/23 1500       Date of Initial Exercise RX and Referring Provider   Date 07/04/23    Referring Provider Croitoru, Mihai MD      Oxygen   Maintain Oxygen Saturation 88% or higher      Treadmill   MPH 2.5    Grade 0.5    Minutes 15    METs 3.09      REL-XR   Level 3    Speed 50    Minutes 15    METs 3      Prescription Details   Frequency (times per week) 2    Duration Progress to 30 minutes of continuous aerobic without signs/symptoms of physical distress      Intensity   THRR 40-80% of Max Heartrate 99-137    Ratings of Perceived Exertion 11-13    Perceived Dyspnea 0-4      Progression   Progression Continue to progress workloads to maintain intensity without signs/symptoms of physical distress.      Resistance Training   Training Prescription Yes    Weight 5 lb    Reps 10-15             Discharge Exercise Prescription (Final Exercise Prescription Changes):  Exercise Prescription Changes - 07/04/23 1500       Response to Exercise   Blood Pressure (Admit) 104/56    Blood Pressure (Exercise) 142/74    Blood Pressure (Exit) 120/66    Heart Rate (Admit) 63 bpm    Heart Rate (Exercise) 112 bpm    Heart Rate (Exit) 59 bpm    Oxygen Saturation (Admit) 97 %    Oxygen Saturation (Exercise) 97 %    Rating of Perceived Exertion (Exercise) 8    Symptoms L knee clicking like normal    Comments walk test results             Functional Capacity:  6 Minute Walk     Row Name  07/04/23 1518         6 Minute Walk   Phase Initial     Distance 1330 feet     Walk Time 6 minutes     # of Rest Breaks 0     MPH 2.52     METS 2.5     RPE 8     VO2 Peak 8.74     Symptoms Yes (comment)     Comments L knee clicking like normal     Resting HR 63 bpm     Resting BP 104/56     Resting Oxygen Saturation  97 %     Exercise Oxygen Saturation  during 6 min walk 97 %     Max Ex. HR 112 bpm     Max Ex. BP 142/74     2 Minute Post BP 120/66              Psychological, QOL, Others - Outcomes: PHQ 2/9:    07/04/2023    3:35 PM 01/31/2023  8:35 PM  Depression screen PHQ 2/9  Decreased Interest  0  Down, Depressed, Hopeless 0 0  PHQ - 2 Score 0 0  Altered sleeping 2   Tired, decreased energy 3   Change in appetite 2   Feeling bad or failure about yourself  0   Trouble concentrating 0   Moving slowly or fidgety/restless 0   Suicidal thoughts 0   PHQ-9 Score 7   Difficult doing work/chores Somewhat difficult     Quality of Life:  Quality of Life - 07/04/23 1533       Quality of Life   Select Quality of Life      Quality of Life Scores   Health/Function Pre 13.37 %    Socioeconomic Pre 25.71 %    Psych/Spiritual Pre 20.21 %    Family Pre 26.4 %    GLOBAL Pre 19.24 %                    Nutrition & Weight - Outcomes:  Pre Biometrics - 07/04/23 1522       Pre Biometrics   Height 5' 11.5" (1.816 m)    Weight 141.3 kg    Waist Circumference 49.5 inches    Hip Circumference 45 inches    Waist to Hip Ratio 1.1 %    BMI (Calculated) 42.83    Grip Strength 43.9 kg    Single Leg Stand 30 seconds              Nutrition:  Nutrition Therapy & Goals - 07/04/23 1533       Intervention Plan   Intervention Prescribe, educate and counsel regarding individualized specific dietary modifications aiming towards targeted core components such as weight, hypertension, lipid management, diabetes, heart failure and other  comorbidities.;Nutrition handout(s) given to patient.    Expected Outcomes Short Term Goal: Understand basic principles of dietary content, such as calories, fat, sodium, cholesterol and nutrients.;Long Term Goal: Adherence to prescribed nutrition plan.             Nutrition Discharge:    Goals reviewed with patient; copy given to patient.

## 2023-09-18 ENCOUNTER — Ambulatory Visit: Admitting: Family Medicine

## 2023-09-19 ENCOUNTER — Encounter (HOSPITAL_COMMUNITY): Payer: Medicare Other

## 2023-09-24 ENCOUNTER — Encounter (HOSPITAL_COMMUNITY): Payer: Medicare Other

## 2023-09-26 ENCOUNTER — Encounter (HOSPITAL_COMMUNITY): Payer: Medicare Other

## 2023-10-01 ENCOUNTER — Encounter (HOSPITAL_COMMUNITY): Payer: Medicare Other

## 2023-10-03 ENCOUNTER — Encounter (HOSPITAL_COMMUNITY): Payer: Medicare Other

## 2023-10-08 ENCOUNTER — Encounter (HOSPITAL_COMMUNITY): Payer: Medicare Other

## 2023-10-10 ENCOUNTER — Encounter (HOSPITAL_COMMUNITY): Payer: Medicare Other

## 2023-10-15 ENCOUNTER — Encounter (HOSPITAL_COMMUNITY): Payer: Medicare Other

## 2023-10-17 ENCOUNTER — Encounter (HOSPITAL_COMMUNITY): Payer: Medicare Other

## 2023-10-22 ENCOUNTER — Encounter (HOSPITAL_COMMUNITY): Payer: Medicare Other

## 2023-10-24 ENCOUNTER — Encounter (HOSPITAL_COMMUNITY): Payer: Medicare Other

## 2023-10-29 ENCOUNTER — Encounter (HOSPITAL_COMMUNITY): Payer: Medicare Other

## 2023-10-30 ENCOUNTER — Telehealth: Payer: Self-pay | Admitting: Cardiovascular Disease

## 2023-10-30 MED ORDER — AMLODIPINE BESYLATE 5 MG PO TABS: ORAL_TABLET | ORAL | 3 refills | Status: DC

## 2023-10-30 NOTE — Telephone Encounter (Signed)
*  STAT* If patient is at the pharmacy, call can be transferred to refill team.   1. Which medications need to be refilled? (please list name of each medication and dose if known) amLODipine  (NORVASC ) 5 MG tablet   2. Which pharmacy/location (including street and city if local pharmacy) is medication to be sent to?  [id number] - [place], [place] - [address]    3. Do they need a 30 day or 90 day supply? 90

## 2023-10-30 NOTE — Telephone Encounter (Signed)
 Pt's medication was sent to pt's pharmacy as requested. Confirmation received.

## 2023-10-31 ENCOUNTER — Encounter (HOSPITAL_COMMUNITY): Payer: Medicare Other

## 2023-12-04 ENCOUNTER — Ambulatory Visit: Payer: Medicare Other | Admitting: Cardiovascular Disease

## 2023-12-06 NOTE — Progress Notes (Signed)
 I, Miquel Amen, CMA acting as a scribe for Garlan Juniper, MD.  Craig Copeland is a 68 y.o. male who presents to [facility] today for 79-month f/u chronic R hip pain. Pt was last seen by Dr. Alease Hunter on 09/06/23 and was given a repeat R inter-articular hip injection  Today, pt reports about 2 months of relief of hip sx s/p injection. Locates pain to lateral aspect, deep. Occasional pain into the right leg. Some weakness. N/T with prolonged sitting. Sitting in the recliner takes the pressure off. Taking Tylenol  prn. Occasional mechanical sx.   Additionally he strained his shoulder playing ping-pong a few weeks ago.  He still has some pain in the anterior shoulder to the lateral upper arm with overhead motion and reaching back.  He denies any pain radiating below the level of the elbow.  He was planning on having right hip replacement around December or January.  This had to be delayed because of a heart catheterization.  He could potentially have surgery at the 1 year mark which would be around August of this year.  Additionally he notes obesity.  He was prescribed Wegovy but held off on it.  He will be following up with his doctor for [facility] this week to consider starting it.  Dx testing: 05/16/22 R hip XR   Pertinent review of systems: No fevers or chills  Relevant historical information: Hypertension and coronary artery disease.  Knee replacement.  Obesity. Patient is currently taking Plavix .  Exam:  BP 118/82   Pulse 69   Ht 6\' 1"  (1.854 m)   Wt (!) 316 lb (143.3 kg)   SpO2 96%   BMI 41.69 kg/m  General: Well Developed, well nourished, and in no acute distress.   MSK: Right hip normal-appearing decreased range of motion pain with flexion.  Right shoulder normal-appearing normal motion pain with abduction intact strength.  Positive Hawkins and Neer's test.    Lab and Radiology Results  Procedure: Real-time Ultrasound Guided Injection of right  hip femoral acetabular joint anterior approach Device: Philips Affiniti 50G/GE Logiq Images permanently stored and available for review in PACS Verbal informed consent obtained.  Discussed risks and benefits of procedure. Warned about infection, bleeding, hyperglycemia damage to structures among others. Patient expresses understanding and agreement Time-out conducted.   Noted no overlying erythema, induration, or other signs of local infection.   Skin prepped in a sterile fashion.   Local anesthesia: Topical Ethyl chloride.   With sterile technique and under real time ultrasound guidance: 40 mg of Kenalog  and 2 mL of Marcaine  injected into hip joint. Fluid seen entering the joint capsule.   Completed without difficulty   Pain immediately resolved suggesting accurate placement of the medication.   Advised to call if fevers/chills, erythema, induration, drainage, or persistent bleeding.   Images permanently stored and available for review in the ultrasound unit.  Impression: Technically successful ultrasound guided injection.    Procedure: Real-time Ultrasound Guided Injection of right shoulder subacromial bursa Device: Philips Affiniti 50G/GE Logiq Images permanently stored and available for review in PACS Verbal informed consent obtained.  Discussed risks and benefits of procedure. Warned about infection, bleeding, hyperglycemia damage to structures among others. Patient expresses understanding and agreement Time-out conducted.   Noted no overlying erythema, induration, or other signs of local infection.   Skin prepped in a sterile fashion.   Local anesthesia: Topical Ethyl chloride.   With sterile technique and under real time ultrasound guidance: 40  mg of Kenalog  and 2 mL of Marcaine  injected into subacromial bursa. Fluid seen entering the bursa.   Completed without difficulty   Pain immediately resolved suggesting accurate placement of the medication.   Advised to call if  fevers/chills, erythema, induration, drainage, or persistent bleeding.   Images permanently stored and available for review in the ultrasound unit.  Impression: Technically successful ultrasound guided injection.         Assessment and Plan: 68 y.o. 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  Plan for repeat steroid injection today.  Previous injection only lasted 2 months.  Recommend following up with Dr. Christiane Cowing soon to consider scheduling surgery for about 3 months from now.  Right shoulder pain due to subacromial bursitis and impingement.  Plan for injection located the subacromial bursa today.  Obesity: BMI currently 41.69.  Needs to be under 40 for hip replacement.  Recommend starting [facility] as already prescribed.  He will talk with his provider about this this week.  He should be able to lose the appropriate amount of weight prior to 3 months anticipated surgery date.  Coronary artery disease with stent and Plavix .  Patient had a drug-eluting stent placed about 10 months ago.  September should be plenty of time after his stent for hip replacement.   PDMP not reviewed this encounter. Orders Placed This Encounter  Procedures   US  LIMITED JOINT SPACE STRUCTURES LOW RIGHT(NO LINKED CHARGES)    Reason for Exam (SYMPTOM  OR DIAGNOSIS REQUIRED):   right hip pain    Preferred imaging location?:   [facility]   No orders of the defined types were placed in this encounter.    Discussed warning signs or symptoms. Please see discharge instructions. Patient expresses understanding.   The above documentation has been reviewed and is accurate and complete Garlan Juniper, M.D.

## 2023-12-09 ENCOUNTER — Other Ambulatory Visit: Payer: Self-pay

## 2023-12-09 ENCOUNTER — Encounter: Payer: Self-pay | Admitting: Family Medicine

## 2023-12-09 ENCOUNTER — Ambulatory Visit: Admitting: Family Medicine

## 2023-12-09 VITALS — BP 118/82 | HR 69 | Ht 73.0 in | Wt 316.0 lb

## 2023-12-09 DIAGNOSIS — I25118 Atherosclerotic heart disease of native coronary artery with other forms of angina pectoris: Secondary | ICD-10-CM

## 2023-12-09 DIAGNOSIS — M25551 Pain in right hip: Secondary | ICD-10-CM

## 2023-12-09 DIAGNOSIS — G8929 Other chronic pain: Secondary | ICD-10-CM

## 2023-12-09 DIAGNOSIS — M25511 Pain in right shoulder: Secondary | ICD-10-CM

## 2023-12-09 DIAGNOSIS — M1611 Unilateral primary osteoarthritis, right hip: Secondary | ICD-10-CM | POA: Diagnosis not present

## 2023-12-09 NOTE — Patient Instructions (Addendum)
 Thank you for coming in today.   You received an injection today. Seek immediate medical attention if the joint becomes red, extremely painful, or is oozing fluid.   Ask your PCP about Wegovy.  Touch base with Dr.Xu.   See you back as needed.

## 2023-12-10 DIAGNOSIS — I252 Old myocardial infarction: Secondary | ICD-10-CM | POA: Diagnosis not present

## 2023-12-10 DIAGNOSIS — I10 Essential (primary) hypertension: Secondary | ICD-10-CM | POA: Diagnosis not present

## 2023-12-10 DIAGNOSIS — I25118 Atherosclerotic heart disease of native coronary artery with other forms of angina pectoris: Secondary | ICD-10-CM | POA: Diagnosis not present

## 2023-12-10 DIAGNOSIS — E782 Mixed hyperlipidemia: Secondary | ICD-10-CM | POA: Diagnosis not present

## 2024-01-24 ENCOUNTER — Ambulatory Visit: Attending: Cardiovascular Disease | Admitting: Cardiovascular Disease

## 2024-01-24 ENCOUNTER — Encounter: Payer: Self-pay | Admitting: Cardiovascular Disease

## 2024-01-24 VITALS — BP 102/70 | HR 67 | Ht 73.0 in | Wt 308.0 lb

## 2024-01-24 DIAGNOSIS — G4733 Obstructive sleep apnea (adult) (pediatric): Secondary | ICD-10-CM

## 2024-01-24 DIAGNOSIS — I10 Essential (primary) hypertension: Secondary | ICD-10-CM | POA: Diagnosis not present

## 2024-01-24 DIAGNOSIS — E785 Hyperlipidemia, unspecified: Secondary | ICD-10-CM | POA: Diagnosis not present

## 2024-01-24 DIAGNOSIS — Z01812 Encounter for preprocedural laboratory examination: Secondary | ICD-10-CM | POA: Diagnosis not present

## 2024-01-24 DIAGNOSIS — I25118 Atherosclerotic heart disease of native coronary artery with other forms of angina pectoris: Secondary | ICD-10-CM

## 2024-01-24 LAB — LIPID PANEL

## 2024-01-24 LAB — CBC

## 2024-01-24 MED ORDER — AMLODIPINE BESYLATE 10 MG PO TABS: 10.0000 mg | ORAL_TABLET | Freq: Every day | ORAL | 3 refills | Status: AC

## 2024-01-24 MED ORDER — ISOSORBIDE MONONITRATE ER 60 MG PO TB24: 60.0000 mg | ORAL_TABLET | Freq: Every day | ORAL | 3 refills | Status: AC

## 2024-01-24 NOTE — Patient Instructions (Addendum)
 Medication Instructions:  Your physician has recommended you make the following change in your medication:   1) STOP losartan  2) INCREASE amlodipine  to 10 mg daily 3) INCREASE isosorbide  mononitrate (Imdur ) to 60 mg daily (start 1 week after taking increased dose of amlodipine )  *If you need a refill on your cardiac medications before your next appointment, please call your pharmacy*  Lab Work: TODAY: Lipid panel, CBC, BMET If you have labs (blood work) drawn today and your tests are completely normal, you will receive your results only by: MyChart Message (if you have MyChart) OR A paper copy in the mail If you have any lab test that is abnormal or we need to change your treatment, we will call you to review the results.  Testing/Procedures: Your physician has requested that you have a cardiac catheterization. Cardiac catheterization is used to diagnose and/or treat various heart conditions. Doctors may recommend this procedure for a number of different reasons. The most common reason is to evaluate chest pain. Chest pain can be a symptom of coronary artery disease (CAD), and cardiac catheterization can show whether plaque is narrowing or blocking your heart's arteries. This procedure is also used to evaluate the valves, as well as measure the blood flow and oxygen levels in different parts of your heart. For further information please visit [email or web address] Please follow instruction sheet, as given.  Follow-Up: At [facility], you and your health needs are our priority.  As part of our continuing mission to provide you with exceptional heart care, our providers are all part of one team.  This team includes your primary Cardiologist (physician) and Advanced Practice Providers or APPs (Physician Assistants and Nurse Practitioners) who all work together to provide you with the care you need, when you need it.  Your next appointment:   3 month(s)  Provider:   One of our Advanced  Practice Providers (APPs): Morse Clause, PA-C  Lamarr Satterfield, NP Miriam Shams, NP  Olivia Pavy, PA-C Josefa Beauvais, NP  Leontine Salen, PA-C Orren Fabry, PA-C  [place], PA-C Ernest Dick, NP  Damien Braver, NP Jon Hails, PA-C  Waddell Donath, PA-C    Dayna Dunn, PA-C  Scott Weaver, PA-C Lum Louis, NP Katlyn West, NP Callie Goodrich, PA-C  Evan Williams, PA-C Sheng Haley, PA-C  Xika Zhao, NP Kathleen Johnson, PA-C   Then, Jerel Balding, MD will plan to see you again in 6 month(s).   We recommend signing up for the patient portal called MyChart.  Sign up information is provided on this After Visit Summary.  MyChart is used to connect with patients for Virtual Visits (Telemedicine).  Patients are able to view lab/test results, encounter notes, upcoming appointments, etc.  Non-urgent messages can be sent to your provider as well.   To learn more about what you can do with MyChart, go to [email or web address].   Other Instructions       Cardiac/Peripheral Catheterization   You are scheduled for a Cardiac Catheterization on Thursday, July 31 with Dr. Lonni Cash.  1. Please arrive at the [facility] (Main Entrance A) at [facility]: [address] [place], [place] [address] at 11:30 AM (This time is 2 hour(s) before your procedure to ensure your preparation). Your procedure is scheduled to begin at 1:30 PM.  Free valet parking service is available. You will check in at ADMITTING. The support person will be asked to wait in the waiting room.  It is OK to have someone drop you off and  come back when you are ready to be discharged.        Special note: Every effort is made to have your procedure done on time. Please understand that emergencies sometimes delay scheduled procedures.  2. Diet: Do not eat solid foods after midnight.  You may have clear liquids until 5 AM the day of the procedure.  3. Labs: You will need to have blood drawn on Friday, July 25  at [facility] (1st Floor), [address], [place], [place] [address]. You do not need to be fasting.  4. Medication instructions in preparation for your procedure:   Contrast Allergy: No  HOLD Wegovy for 1 week prior to scheduled heart catherization.  On the morning of your procedure, take Aspirin  81 mg and Plavix /Clopidogrel  and any morning medicines NOT listed above.  You may use sips of water.  5. Plan to go home the same day, you will only stay overnight if medically necessary. 6. You MUST have a responsible adult to drive you home. 7. An adult MUST be with you the first 24 hours after you arrive home. 8. Bring a current list of your medications, and the last time and date medication taken. 9. Bring ID and current insurance cards. 10.Please wear clothes that are easy to get on and off and wear slip-on shoes.  Thank you for allowing us  to care for you!   -- [facility]

## 2024-01-24 NOTE — H&P (View-Only) (Signed)
 Patient ID: SEELEY HISSONG, male   DOB: [birth year], 68 y.o.   MRN: [id number]      Cardiology Office Note   Date:  01/24/2024   ID:  [name], Nephew June 21, 1956, MRN [id number]  PCP:  Clarice Nottingham, MD  Cardiologist:   Jerel Balding, MD   Chief Complaint  Patient presents with   Chest Pain     History of Present Illness: Craig Copeland is a 68 y.o. male who presents for follow-up for coronary artery disease, hypertension and hyperlipidemia.    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  He did receive a new drug-eluting stent to the proximal right coronary artery for a guy-provoked dissection at the time of the last unsuccessful revascularization attempt 01/31/2023.  He has had problems with exertional related angina pectoris ever since, but these have dramatically escalated over the last 2 months.  Even light physical activity such as walking to the car from the house seems to bring it on.  The symptoms generally subside after rest for a few minutes or after taking a single sublingual nitroglycerin .  He has rarely needed to take a second sublingual nitroglycerin .  Occasionally it appears he has had some episodes at rest but these have also been very brief.  He is on 4 antianginal medications including amlodipine  5 mg daily, carvedilol  6.25 mg twice daily, isosorbide  mononitrate 30 mg daily and ranolazine  at 1000 mg twice daily.  His blood pressure today is quite low at 102/70, but at home his blood pressure is typically in the 110-120s.  He has occasional dizziness but has not experienced syncope.  He denies palpitations.  He has not been using CPAP for years due to sinus drainage.  He complains of fatigue and often has to take naps in the afternoon.  He remains morbidly obese with a BMI of almost 41.  His most recent lipid profile  showed an LDL of 51 but also chronically low HDL at 38.  He takes pravastatin  but was intolerant of rosuvastatin and atorvastatin.  A1c was 5.7%  Remains on chronic treatment with aspirin  and clopidogrel .  He has a history of previous stent to the right coronary artery(2004, drug-eluting 3.0 x 33 mm Cypher stent overlapping 3.0 x 23 mm Cypher stent in the mid to proximal right coronary artery) and a moderate to severe ostial lesion of a small to medium ramus intermedius artery (too small for percutaneous revascularization). No change on cardiac cath performed in 2018. He was suspected of having vasospastic angina, but he showed substantial improvement on treatment with Ranexa .  He has normal left ventricle systolic function by echo most recently performed at [facility] in July 2024(EF 60-65% with normal wall motion); nuclear study at [facility] 12/30/2022 showed inferior wall ischemia and EF 61%..  History of hypertension and hyperlipidemia. He is morbidly obese. He is not compliant with CPAP for obstructive sleep apnea. He is on chronic treatment with aspirin  and clopidogrel .    Past Medical History:  Diagnosis Date   CAD (coronary artery disease)    Dizziness    Dyslipidemia    Flu 06/2016   Had again 01/208 with PNA   Obesity    OSA (obstructive sleep apnea)    Systemic hypertension    Vasospastic angina [facility])     Past Surgical History:  Procedure Laterality Date   CARDIAC CATHETERIZATION  12/02/2002   <20% restenosis RCA   CARDIAC CATHETERIZATION  01/30/2010   20-30% in-stent restenosis,60-70% ostial stenosis   CORONARY ANGIOPLASTY WITH STENT PLACEMENT  11/13/2002   mid RCA   CORONARY ATHERECTOMY N/A 01/31/2023   Procedure: CORONARY ATHERECTOMY;  Surgeon: Swaziland, Peter M, MD;  Location: [facility] INVASIVE CV LAB;  Service: Cardiovascular;  Laterality: N/A;   CORONARY CTO INTERVENTION N/A 01/31/2023   Procedure: CORONARY CTO INTERVENTION;  Surgeon: Swaziland, Peter M, MD;  Location: [facility] INVASIVE CV  LAB;  Service: Cardiovascular;  Laterality: N/A;   KNEE SURGERY  2003   LEFT HEART CATH AND CORONARY ANGIOGRAPHY N/A 09/05/2016   Procedure: Left Heart Cath and Coronary Angiography;  Surgeon: Lonni JONETTA Cash, MD;  Location: [facility] INVASIVE CV LAB;  Service: Cardiovascular;  Laterality: N/A;   LEFT HEART CATHETERIZATION WITH CORONARY ANGIOGRAM N/A 12/22/2012   Procedure: LEFT HEART CATHETERIZATION WITH CORONARY ANGIOGRAM;  Surgeon: Jerel Balding, MD;  Location: [facility] CATH LAB;  Service: Cardiovascular;  Laterality: N/A;   NOSE SURGERY     TOTAL KNEE ARTHROPLASTY Left 03/22/2017   Procedure: LEFT TOTAL KNEE ARTHROPLASTY;  Surgeon: Gerome Charleston, MD;  Location: [facility] ORS;  Service: Orthopedics;  Laterality: Left;   VASECTOMY       Current Outpatient Medications  Medication Sig Dispense Refill   amLODipine  (NORVASC ) 10 MG tablet Take 1 tablet (10 mg total) by mouth daily. 90 tablet 3   isosorbide  mononitrate (IMDUR ) 60 MG 24 hr tablet Take 1 tablet (60 mg total) by mouth daily. 90 tablet 3   acetaminophen  (TYLENOL ) 500 MG tablet Take 500 mg by mouth as needed for moderate pain.     aspirin  EC 81 MG tablet Take 81 mg by mouth daily.     carvedilol  (COREG ) 6.25 MG tablet Take 1 tablet (6.25 mg total) by mouth 2 (two) times daily. 180 tablet 3   cholecalciferol  (VITAMIN D ) 1000 units tablet Take 1,000 Units by mouth daily.     clopidogrel  (PLAVIX ) 75 MG tablet Take 75 mg by mouth daily.     ezetimibe  (ZETIA ) 10 MG tablet Take 10 mg by mouth every evening.      fish oil-omega-3 fatty acids 1000 MG capsule Take 1 g by mouth daily.      nitroGLYCERIN  (NITROSTAT ) 0.4 MG SL tablet PLACE 1 TABLET UNDER THE TONGUE EVERY 5 MINUTES AS NEEDED FOR CHEST PAIN. 25 tablet 2   pravastatin  (PRAVACHOL ) 80 MG tablet Take 80 mg by mouth at bedtime.      ranolazine  (RANEXA ) 1000 MG SR tablet Take 1 tablet (1,000 mg total) by mouth 2 (two) times daily. 60 tablet 0   WEGOVY 0.25 MG/0.5ML SOAJ      No current  facility-administered medications for this visit.    Allergies:   Atorvastatin, Augmentin [amoxicillin-pot clavulanate], and Rosuvastatin    Social History:  The patient  reports that he has never smoked. He has never used smokeless tobacco. He reports current alcohol use of about 1.0 standard drink of alcohol per week. He reports that he does not use drugs.   Family History:  The patient's family history includes Alzheimer's disease in his paternal grandfather; Cancer in his paternal grandfather; Heart failure in his father and maternal grandmother; Hypertension in his father; Stroke in his paternal grandmother.    ROS:  Please see the history of present illness.  He denies shortness of breath at rest or with activity, orthopnea, PND, claudication, focal neurological complaints.  All other systems are reviewed and are  negative.  PHYSICAL EXAM: VS:  BP 102/70 (BP Location: Left Arm, Patient Position: Sitting, Cuff Size: Large)   Pulse 67   Ht 6' 1 (1.854 m)   Wt (!) 308 lb (139.7 kg)   SpO2 95%   BMI 40.64 kg/m  , BMI Body mass index is 40.64 kg/m.   General: Alert, oriented x3, no distress, morbidly obese Head: no evidence of trauma, PERRL, EOMI, no exophtalmos or lid lag, no myxedema, no xanthelasma; normal ears, nose and oropharynx Neck: normal jugular venous pulsations and no hepatojugular reflux; brisk carotid pulses without delay and no carotid bruits Chest: clear to auscultation, no signs of consolidation by percussion or palpation, normal fremitus, symmetrical and full respiratory excursions Cardiovascular: normal position and quality of the apical impulse, regular rhythm, normal first and second heart sounds, 1/6 aortic ejection murmur, no diastolic murmurs, rubs or gallops Abdomen: no tenderness or distention, no masses by palpation, no abnormal pulsatility or arterial bruits, normal bowel sounds, no hepatosplenomegaly Extremities: no clubbing, cyanosis or edema; 2+ radial,  ulnar and brachial pulses bilaterally; 2+ right femoral, posterior tibial and dorsalis pedis pulses; 2+ left femoral, posterior tibial and dorsalis pedis pulses; no subclavian or femoral bruits Neurological: grossly nonfocal Psych: Normal mood and affect    EKG:   EKG Interpretation Date/Time:  Friday January 24 2024 08:33:57 EDT Ventricular Rate:  67 PR Interval:  204 QRS Duration:  90 QT Interval:  386 QTC Calculation: 407 R Axis:   -13  Text Interpretation: Normal sinus rhythm Cannot rule out Inferior infarct , age undetermined When compared with ECG of 12-Feb-2023 15:02, Criteria for Anterior infarct are no longer Present Inferior infarct is now Present Confirmed by Tynisa Vohs [phone]) on 01/24/2024 8:43:53 AM        Recent Labs: 10/24/2020 Creatinine 0.94, ALT 54, glucose 114 Lipid Panel     Component Value Date/Time   CHOL 116 01/31/2023 0253   TRIG 81 01/31/2023 0253   HDL 39 (L) 01/31/2023 0253   CHOLHDL 3.0 01/31/2023 0253   VLDL 16 01/31/2023 0253   LDLCALC 61 01/31/2023 0253    February 20, 2018 Total cholesterol 117, HDL 32, triglycerides 108, calculated LDL 67.  9 08 2020 Total cholesterol 119, HDL 34, triglycerides 72 Creatinine 0.7, normal liver function test  04/26/2021 Cholesterol 122, HDL 33, LDL 70, triglycerides 93  88/94/7975 Cholesterol 110, HDL 38, LDL 51, triglycerides 114, hemoglobin A1c 5.7%  Wt Readings from Last 3 Encounters:  01/24/24 (!) 308 lb (139.7 kg)  12/09/23 (!) 316 lb (143.3 kg)  09/06/23 (!) 315 lb (142.9 kg)     ASSESSMENT AND PLAN:  1. Coronary artery disease of native artery of native heart with stable angina pectoris (HCC)   2. Dyslipidemia (high LDL; low HDL)   3. Essential hypertension   4. Pre-procedure lab exam   5. Morbid obesity (HCC)   6. OSA on CPAP      1. CAD s/p PCI RCA: Seems to be having crescendo angina over the last 2 months.  He is roughly 1 year following his last attempt at RCA  revascularization.  Although the procedure was unsuccessful he did require placement of a new drug-eluting stent in the proximal vessel due to a guide related dissection and he could have in-stent restenosis.  Will schedule for another cardiac catheterization.  On the other hand is just is likely that his angina is still related to the severe mid RCA stenosis.  Will try to optimize antianginals by stopping  his losartan  so that we can increase the dose of amlodipine  to 10 mg daily.  Also plan to increase his isosorbide  mononitrate to 60 mg daily but asked him to make these changes in a stepwise fashion a week apart.  He has had 2 unsuccessful attempts at revascularization of the right coronary artery including by our CTO team.  He could undergo surgical revascularization, but this would be a rather radical approach for what is a low risk for coronary situation.  He does have bridging collaterals to the distal vessel.  He does not have significant stenoses in the left coronary system.  Will keep trying to titrate his antianginal medications.  He has an excellent lipid profile.  He should continue on dual antiplatelet therapy at least through early February (6 months since the placement of a stent in his proximal right coronary artery).  In the past he had good symptomatic response to twice daily long-acting nitrates added to his amlodipine .  This seems to confirm the suspicion that he has some degree of coronary vasospasm.   He did not tolerate higher beta-blocker doses be due to bradycardia.  Continue lipid-lowering therapy dual antiplatelet therapy. 2. Hyperlipidemia.  Continue pravastatin .  His LDL is excellent, less than 55.  His chronic HDL cholesterol will not improve without substantial weight loss. 3. Hypertension: We will have to stop his losartan  in order to allow higher dose of amlodipine . 4. Morbid obesity: Very little progress despite a healthy diet.  He was intolerant of GLP-1 agonist. 5. OSA: He  tells me that he has not been using CPAP for years since i he has too much nasal drainage.  This likely explains his daytime hypersomnolence. 6. Aortic sclerosis:   He has a murmur of aortic valve sclerosis but did not have any evidence of stenosis on a recent echocardiogram in July 2024.  Informed Consent   Shared Decision Making/Informed Consent The risks [stroke (1 in 1000), death (1 in 1000), kidney failure [usually temporary] (1 in 500), bleeding (1 in 200), allergic reaction [possibly serious] (1 in 200)], benefits (diagnostic support and management of coronary artery disease) and alternatives of a cardiac catheterization were discussed in detail with Mr. Aldea and he is willing to proceed.      Patient Instructions  Medication Instructions:  Your physician has recommended you make the following change in your medication:   1) STOP losartan  2) INCREASE amlodipine  to 10 mg daily 3) INCREASE isosorbide  mononitrate (Imdur ) to 60 mg daily (start 1 week after taking increased dose of amlodipine )  *If you need a refill on your cardiac medications before your next appointment, please call your pharmacy*  Lab Work: TODAY: Lipid panel, CBC, BMET If you have labs (blood work) drawn today and your tests are completely normal, you will receive your results only by: MyChart Message (if you have MyChart) OR A paper copy in the mail If you have any lab test that is abnormal or we need to change your treatment, we will call you to review the results.  Testing/Procedures: Your physician has requested that you have a cardiac catheterization. Cardiac catheterization is used to diagnose and/or treat various heart conditions. Doctors may recommend this procedure for a number of different reasons. The most common reason is to evaluate chest pain. Chest pain can be a symptom of coronary artery disease (CAD), and cardiac catheterization can show whether plaque is narrowing or blocking your heart's  arteries. This procedure is also used to evaluate the valves, as well  as measure the blood flow and oxygen levels in different parts of your heart. For further information please visit [email or web address] Please follow instruction sheet, as given.  Follow-Up: At [facility], you and your health needs are our priority.  As part of our continuing mission to provide you with exceptional heart care, our providers are all part of one team.  This team includes your primary Cardiologist (physician) and Advanced Practice Providers or APPs (Physician Assistants and Nurse Practitioners) who all work together to provide you with the care you need, when you need it.  Your next appointment:   3 month(s)  Provider:   One of our Advanced Practice Providers (APPs): Morse Clause, PA-C  Lamarr Satterfield, NP Miriam Shams, NP  Olivia Pavy, PA-C Josefa Beauvais, NP  Leontine Salen, PA-C Orren Fabry, PA-C  [place], PA-C Ernest Dick, NP  Damien Braver, NP Jon Hails, PA-C  Waddell Donath, PA-C    Dayna Dunn, PA-C  Scott Weaver, PA-C Lum Louis, NP Katlyn West, NP Callie Goodrich, PA-C  Evan Williams, PA-C Sheng Haley, PA-C  Xika Zhao, NP Kathleen Johnson, PA-C   Then, Jerel Balding, MD will plan to see you again in 6 month(s).   We recommend signing up for the patient portal called MyChart.  Sign up information is provided on this After Visit Summary.  MyChart is used to connect with patients for Virtual Visits (Telemedicine).  Patients are able to view lab/test results, encounter notes, upcoming appointments, etc.  Non-urgent messages can be sent to your provider as well.   To learn more about what you can do with MyChart, go to [email or web address].   Other Instructions     Signed, Jerel Balding, MD  01/24/2024 9:18 AM    Jerel Balding, MD, [facility] [phone] office [phone] pager

## 2024-01-24 NOTE — Progress Notes (Signed)
 Patient ID: [facility], male   DOB: [birth year], 68 y.o.   MRN: [id number]      Cardiology Office Note   Date:  01/24/2024   ID:  [facility], Nephew [facility], MRN [id number]  PCP:  Craig Nottingham, MD  Cardiologist:   Craig Balding, MD   Chief Complaint  Patient presents with   Chest Pain     History of Present Illness: [facility] is a 68 y.o. male who presents for follow-up for coronary artery disease, hypertension and hyperlipidemia.    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  He did receive a new drug-eluting stent to the proximal right coronary artery for a guy-provoked dissection at the time of the last unsuccessful revascularization attempt 01/31/2023.  He has had problems with exertional related angina pectoris ever since, but these have dramatically escalated over the last 2 months.  Even light physical activity such as walking to the car from the house seems to bring it on.  The symptoms generally subside after rest for a few minutes or after taking a single sublingual nitroglycerin .  He has rarely needed to take a second sublingual nitroglycerin .  Occasionally it appears he has had some episodes at rest but these have also been very brief.  He is on 4 antianginal medications including amlodipine  5 mg daily, carvedilol  6.25 mg twice daily, isosorbide  mononitrate 30 mg daily and ranolazine  at 1000 mg twice daily.  His blood pressure today is quite low at 102/70, but at home his blood pressure is typically in the 110-120s.  He has occasional dizziness but has not experienced syncope.  He denies palpitations.  He has not been using CPAP for years due to sinus drainage.  He complains of fatigue and often has to take naps in the afternoon.  He remains morbidly obese with a BMI of almost 41.  His most recent lipid profile  showed an LDL of 51 but also chronically low HDL at 38.  He takes pravastatin  but was intolerant of rosuvastatin and atorvastatin.  A1c was 5.7%  Remains on chronic treatment with aspirin  and clopidogrel .  He has a history of previous stent to the right coronary artery(2004, drug-eluting 3.0 x 33 mm Cypher stent overlapping 3.0 x 23 mm Cypher stent in the mid to proximal right coronary artery) and a moderate to severe ostial lesion of a small to medium ramus intermedius artery (too small for percutaneous revascularization). No change on cardiac cath performed in 2018. He was suspected of having vasospastic angina, but he showed substantial improvement on treatment with Ranexa .  He has normal left ventricle systolic function by echo most recently performed at [facility] in July 2024(EF 60-65% with normal wall motion); nuclear study at [facility] 12/30/2022 showed inferior wall ischemia and EF 61%..  History of hypertension and hyperlipidemia. He is morbidly obese. He is not compliant with CPAP for obstructive sleep apnea. He is on chronic treatment with aspirin  and clopidogrel .    Past Medical History:  Diagnosis Date   CAD (coronary artery disease)    Dizziness    Dyslipidemia    Flu 06/2016   Had again 01/208 with PNA   Obesity    OSA (obstructive sleep apnea)    Systemic hypertension    Vasospastic angina [facility])     Past Surgical History:  Procedure Laterality Date   CARDIAC CATHETERIZATION  12/02/2002   <20% restenosis RCA   CARDIAC CATHETERIZATION  01/30/2010   20-30% in-stent restenosis,60-70% ostial stenosis   CORONARY ANGIOPLASTY WITH STENT PLACEMENT  11/13/2002   mid RCA   CORONARY ATHERECTOMY N/A 01/31/2023   Procedure: CORONARY ATHERECTOMY;  Surgeon: [facility], Craig M, MD;  [facility];  Service: Cardiovascular;  Laterality: N/A;   CORONARY CTO INTERVENTION N/A 01/31/2023   Procedure: CORONARY CTO INTERVENTION;  Surgeon: [facility], Craig M, MD;  [facility];  Service: Cardiovascular;  Laterality: N/A;   KNEE SURGERY  2003   LEFT HEART CATH AND CORONARY ANGIOGRAPHY N/A 09/05/2016   Procedure: Left Heart Cath and Coronary Angiography;  Surgeon: Craig JONETTA Cash, MD;  [facility];  Service: Cardiovascular;  Laterality: N/A;   LEFT HEART CATHETERIZATION WITH CORONARY ANGIOGRAM N/A 12/22/2012   Procedure: LEFT HEART CATHETERIZATION WITH CORONARY ANGIOGRAM;  Surgeon: Craig Balding, MD;  Location: [facility];  Service: Cardiovascular;  Laterality: N/A;   NOSE SURGERY     TOTAL KNEE ARTHROPLASTY Left 03/22/2017   Procedure: LEFT TOTAL KNEE ARTHROPLASTY;  Surgeon: Craig Charleston, MD;  Location: [facility];  Service: Orthopedics;  Laterality: Left;   VASECTOMY       Current Outpatient Medications  Medication Sig Dispense Refill   amLODipine  (NORVASC ) 10 MG tablet Take 1 tablet (10 mg total) by mouth daily. 90 tablet 3   isosorbide  mononitrate (IMDUR ) 60 MG 24 hr tablet Take 1 tablet (60 mg total) by mouth daily. 90 tablet 3   acetaminophen  (TYLENOL ) 500 MG tablet Take 500 mg by mouth as needed for moderate pain.     aspirin  EC 81 MG tablet Take 81 mg by mouth daily.     carvedilol  (COREG ) 6.25 MG tablet Take 1 tablet (6.25 mg total) by mouth 2 (two) times daily. 180 tablet 3   cholecalciferol  (VITAMIN D ) 1000 units tablet Take 1,000 Units by mouth daily.     clopidogrel  (PLAVIX ) 75 MG tablet Take 75 mg by mouth daily.     ezetimibe  (ZETIA ) 10 MG tablet Take 10 mg by mouth every evening.      fish oil-omega-3 fatty acids 1000 MG capsule Take 1 g by mouth daily.      nitroGLYCERIN  (NITROSTAT ) 0.4 MG SL tablet PLACE 1 TABLET UNDER THE TONGUE EVERY 5 MINUTES AS NEEDED FOR CHEST PAIN. 25 tablet 2   pravastatin  (PRAVACHOL ) 80 MG tablet Take 80 mg by mouth at bedtime.      ranolazine  (RANEXA ) 1000 MG SR tablet Take 1 tablet (1,000 mg total) by mouth 2 (two) times daily. 60 tablet 0   WEGOVY 0.25 MG/0.5ML SOAJ      No current  facility-administered medications for this visit.    Allergies:   Atorvastatin, Augmentin [amoxicillin-pot clavulanate], and Rosuvastatin    Social History:  The patient  reports that he has never smoked. He has never used smokeless tobacco. He reports current alcohol use of about 1.0 standard drink of alcohol per week. He reports that he does not use drugs.   Family History:  The patient's family history includes Alzheimer's disease in his paternal grandfather; Cancer in his paternal grandfather; Heart failure in his father and maternal grandmother; Hypertension in his father; Stroke in his paternal grandmother.    ROS:  Please see the history of present illness.  He denies shortness of breath at rest or with activity, orthopnea, PND, claudication, focal neurological complaints.  All other systems are reviewed and are  negative.  PHYSICAL EXAM: VS:  BP 102/70 (BP Location: Left Arm, Patient Position: Sitting, Cuff Size: Large)   Pulse 67   Ht 6' 1 (1.854 [facility])   Wt (!) 308 lb (139.7 kg)   SpO2 95%   BMI 40.64 kg/[facility]  , BMI Body mass index is 40.64 kg/[facility].   General: Alert, oriented x3, no distress, morbidly obese Head: no evidence of trauma, PERRL, EOMI, no exophtalmos or lid lag, no myxedema, no xanthelasma; normal ears, nose and oropharynx Neck: normal jugular venous pulsations and no hepatojugular reflux; brisk carotid pulses without delay and no carotid bruits Chest: clear to auscultation, no signs of consolidation by percussion or palpation, normal fremitus, symmetrical and full respiratory excursions Cardiovascular: normal position and quality of the apical impulse, regular rhythm, normal first and second heart sounds, 1/6 aortic ejection murmur, no diastolic murmurs, rubs or gallops Abdomen: no tenderness or distention, no masses by palpation, no abnormal pulsatility or arterial bruits, normal bowel sounds, no hepatosplenomegaly Extremities: no clubbing, cyanosis or edema; 2+ radial,  ulnar and brachial pulses bilaterally; 2+ right femoral, posterior tibial and dorsalis pedis pulses; 2+ left femoral, posterior tibial and dorsalis pedis pulses; no subclavian or femoral bruits Neurological: grossly nonfocal Psych: Normal mood and affect    EKG:   EKG Interpretation Date/Time:  Friday January 24 2024 08:33:57 EDT Ventricular Rate:  67 PR Interval:  204 QRS Duration:  90 QT Interval:  386 QTC Calculation: 407 R Axis:   -13  Text Interpretation: Normal sinus rhythm Cannot rule out Inferior infarct , age undetermined When compared with ECG of 12-Feb-2023 15:02, Criteria for Anterior infarct are no longer Present Inferior infarct is now Present Confirmed by Tynisa Vohs [phone]) on 01/24/2024 8:43:53 AM        Recent Labs: 10/24/2020 Creatinine 0.94, ALT 54, glucose 114 Lipid Panel     Component Value Date/Time   CHOL 116 01/31/2023 0253   TRIG 81 01/31/2023 0253   HDL 39 (L) 01/31/2023 0253   CHOLHDL 3.0 01/31/2023 0253   VLDL 16 01/31/2023 0253   LDLCALC 61 01/31/2023 0253    February 20, 2018 Total cholesterol 117, HDL 32, triglycerides 108, calculated LDL 67.  9 08 2020 Total cholesterol 119, HDL 34, triglycerides 72 Creatinine 0.7, normal liver function test  04/26/2021 Cholesterol 122, HDL 33, LDL 70, triglycerides 93  88/94/7975 Cholesterol 110, HDL 38, LDL 51, triglycerides 114, hemoglobin A1c 5.7%  Wt Readings from Last 3 Encounters:  01/24/24 (!) 308 lb (139.7 kg)  12/09/23 (!) 316 lb (143.3 kg)  09/06/23 (!) 315 lb (142.9 kg)     ASSESSMENT AND PLAN:  1. Coronary artery disease of native artery of native heart with stable angina pectoris (HCC)   2. Dyslipidemia (high LDL; low HDL)   3. Essential hypertension   4. Pre-procedure [facility] exam   5. Morbid obesity (HCC)   6. OSA on CPAP      1. CAD s/p PCI RCA: Seems to be having crescendo angina over the last 2 months.  He is roughly 1 year following his last attempt at RCA  revascularization.  Although the procedure was unsuccessful he did require placement of a new drug-eluting stent in the proximal vessel due to a guide related dissection and he could have in-stent restenosis.  Will schedule for another cardiac catheterization.  On the other hand is just is likely that his angina is still related to the severe mid RCA stenosis.  Will try to optimize antianginals by stopping  his losartan  so that we can increase the dose of amlodipine  to 10 mg daily.  Also plan to increase his isosorbide  mononitrate to 60 mg daily but asked him to make these changes in a stepwise fashion a week apart.  He has had 2 unsuccessful attempts at revascularization of the right coronary artery including by our CTO team.  He could undergo surgical revascularization, but this would be a rather radical approach for what is a low risk for coronary situation.  He does have bridging collaterals to the distal vessel.  He does not have significant stenoses in the left coronary system.  Will keep trying to titrate his antianginal medications.  He has an excellent lipid profile.  He should continue on dual antiplatelet therapy at least through early February (6 months since the placement of a stent in his proximal right coronary artery).  In the past he had good symptomatic response to twice daily long-acting nitrates added to his amlodipine .  This seems to confirm the suspicion that he has some degree of coronary vasospasm.   He did not tolerate higher beta-blocker doses be due to bradycardia.  Continue lipid-lowering therapy dual antiplatelet therapy. 2. Hyperlipidemia.  Continue pravastatin .  His LDL is excellent, less than 55.  His chronic HDL cholesterol will not improve without substantial weight loss. 3. Hypertension: We will have to stop his losartan  in order to allow higher dose of amlodipine . 4. Morbid obesity: Very little progress despite a healthy diet.  He was intolerant of GLP-1 agonist. 5. OSA: He  tells me that he has not been using CPAP for years since i he has too much nasal drainage.  This likely explains his daytime hypersomnolence. 6. Aortic sclerosis:   He has a murmur of aortic valve sclerosis but did not have any evidence of stenosis on a recent echocardiogram in July 2024.  Informed Consent   Shared Decision Making/Informed Consent The risks [stroke (1 in 1000), death (1 in 1000), kidney failure [usually temporary] (1 in 500), bleeding (1 in 200), allergic reaction [possibly serious] (1 in 200)], benefits (diagnostic support and management of coronary artery disease) and alternatives of a cardiac catheterization were discussed in detail with [facility] and he is willing to proceed.      Patient Instructions  Medication Instructions:  Your physician has recommended you make the following change in your medication:   1) STOP losartan  2) INCREASE amlodipine  to 10 mg daily 3) INCREASE isosorbide  mononitrate (Imdur ) to 60 mg daily (start 1 week after taking increased dose of amlodipine )  *If you need a refill on your cardiac medications before your next appointment, please call your pharmacy*  [facility] Work: TODAY: Lipid panel, CBC, BMET If you have labs (blood work) drawn today and your tests are completely normal, you will receive your results only by: MyChart Message (if you have MyChart) OR A paper copy in the mail If you have any [facility] test that is abnormal or we need to change your treatment, we will call you to review the results.  Testing/Procedures: Your physician has requested that you have a cardiac catheterization. Cardiac catheterization is used to diagnose and/or treat various heart conditions. Doctors may recommend this procedure for a number of different reasons. The most common reason is to evaluate chest pain. Chest pain can be a symptom of coronary artery disease (CAD), and cardiac catheterization can show whether plaque is narrowing or blocking your heart's  arteries. This procedure is also used to evaluate the valves, as well  as measure the blood flow and oxygen levels in different parts of your heart. For further information please visit [email or web address] Please follow instruction sheet, as given.  Follow-Up: At [facility], you and your [facility] needs are our priority.  As part of our continuing mission to provide you with exceptional heart care, our providers are all part of one team.  This team includes your primary Cardiologist (physician) and Advanced Practice Providers or APPs (Physician Assistants and Nurse Practitioners) who all work together to provide you with the care you need, when you need it.  Your next appointment:   3 month(s)  Provider:   One of our Advanced Practice Providers (APPs): Morse Clause, PA-C  Lamarr Satterfield, NP Miriam Shams, NP  Olivia Pavy, PA-C Josefa Beauvais, NP  Leontine Salen, PA-C Orren Fabry, PA-C  [place], PA-C Ernest Dick, NP  Damien Braver, NP Jon Hails, PA-C  Waddell Donath, PA-C    Dayna Dunn, PA-C  Scott Weaver, PA-C Lum Louis, NP Katlyn West, NP Callie Goodrich, PA-C  Evan Williams, PA-C Sheng Haley, PA-C  Xika Zhao, NP Kathleen Johnson, PA-C   Then, [facility]ding, MD will plan to see you again in 6 month(s).   We recommend signing up for the patient portal called MyChart.  Sign up information is provided on this After Visit Summary.  MyChart is used to connect with patients for Virtual Visits (Telemedicine).  Patients are able to view [facility]/test results, encounter notes, upcoming appointments, etc.  Non-urgent messages can be sent to your provider as well.   To learn more about what you can do with MyChart, go to [email or web address].   Other Instructions     Signed, Craig Balding, MD  01/24/2024 9:18 AM    Craig Balding, MD, [facility] [phone] office [phone] pager

## 2024-01-25 ENCOUNTER — Ambulatory Visit: Payer: Self-pay | Admitting: Cardiovascular Disease

## 2024-01-25 LAB — LIPID PANEL
Chol/HDL Ratio: 3.2 ratio (ref 0.0–5.0)
Cholesterol, Total: 120 mg/dL (ref 100–199)
HDL: 37 mg/dL — ABNORMAL LOW (ref >39)
LDL Chol Calc (NIH): 61 mg/dL (ref 0–99)
Triglycerides: 124 mg/dL (ref 0–149)
VLDL Cholesterol Cal: 22 mg/dL (ref 5–40)

## 2024-01-25 LAB — CBC
Hematocrit: 43.6 % (ref 37.5–51.0)
Hemoglobin: 14.6 g/dL (ref 13.0–17.7)
MCH: 31.3 pg (ref 26.6–33.0)
MCHC: 33.5 g/dL (ref 31.5–35.7)
MCV: 94 fL (ref 79–97)
Platelets: 209 x10E3/uL (ref 150–450)
RBC: 4.66 x10E6/uL (ref 4.14–5.80)
RDW: 13 % (ref 11.6–15.4)
WBC: 5.8 x10E3/uL (ref 3.4–10.8)

## 2024-01-25 LAB — BASIC METABOLIC PANEL WITH GFR
BUN/Creatinine Ratio: 16 (ref 10–24)
BUN: 19 mg/dL (ref 8–27)
CO2: 18 mmol/L — ABNORMAL LOW (ref 20–29)
Calcium: 9.1 mg/dL (ref 8.6–10.2)
Chloride: 102 mmol/L (ref 96–106)
Creatinine, Ser: 1.18 mg/dL (ref 0.76–1.27)
Glucose: 101 mg/dL — ABNORMAL HIGH (ref 70–99)
Potassium: 4.7 mmol/L (ref 3.5–5.2)
Sodium: 136 mmol/L (ref 134–144)
eGFR: 68 mL/min/1.73 (ref >59)

## 2024-01-29 ENCOUNTER — Telehealth: Payer: Self-pay | Admitting: *Deleted

## 2024-01-29 NOTE — Telephone Encounter (Signed)
 Reviewed procedure instructions with patient.

## 2024-01-29 NOTE — Telephone Encounter (Signed)
Pt returning call to [facility].

## 2024-01-29 NOTE — Telephone Encounter (Signed)
 Cardiac Catheterization scheduled at [facility] for: Thursday January 30, 2024 1:30 PM Arrival time [facility] Main Entrance A at: 11:30 AM  Cath Lab Diet: -May have light meal until 7:30 AM.  ( 6 hours before procedure time) Light meal consist of plain toast, fruit, light soups, crackers  Hydration: -May drink clear liquids until leaving for hospital. Stay well hydrated. -Drink 16 oz  bottle of water  on the way to the hospital. Approved liquids: water , clear juice, clear tea, black coffee, fruit juices, non-citric and without pulp, carbonated beverages, Gatorade, Kool -Aid, plain Jello-O and plain ice popsicles.  Medication instructions: -Usual morning medications can be taken including aspirin  81 mg and Plavix  75 mg.  Plan to go home the same day, you will only stay overnight if medically necessary.  You must have responsible adult to drive you home.  Someone must be with you the first 24 hours after you arrive home.  Left message for patient to call back to review instructions.

## 2024-01-30 ENCOUNTER — Other Ambulatory Visit: Payer: Self-pay

## 2024-01-30 ENCOUNTER — Encounter (HOSPITAL_COMMUNITY)
Admission: RE | Disposition: A | Discharge status: Home / Self Care | Source: Home / Self Care | Attending: Cardiovascular Disease

## 2024-01-30 ENCOUNTER — Ambulatory Visit (HOSPITAL_COMMUNITY)
Admission: RE | Admit: 2024-01-30 | Discharge: 2024-01-30 | Disposition: A | Discharge status: Home / Self Care | Attending: Cardiovascular Disease | Admitting: Cardiovascular Disease

## 2024-01-30 ENCOUNTER — Encounter (HOSPITAL_COMMUNITY): Payer: Self-pay | Admitting: Cardiovascular Disease

## 2024-01-30 DIAGNOSIS — E785 Hyperlipidemia, unspecified: Secondary | ICD-10-CM | POA: Insufficient documentation

## 2024-01-30 DIAGNOSIS — Z6841 Body Mass Index (BMI) 40.0 and over, adult: Secondary | ICD-10-CM | POA: Diagnosis not present

## 2024-01-30 DIAGNOSIS — I25118 Atherosclerotic heart disease of native coronary artery with other forms of angina pectoris: Secondary | ICD-10-CM | POA: Insufficient documentation

## 2024-01-30 DIAGNOSIS — Z79899 Other long term (current) drug therapy: Secondary | ICD-10-CM | POA: Diagnosis not present

## 2024-01-30 DIAGNOSIS — Z955 Presence of coronary angioplasty implant and graft: Secondary | ICD-10-CM | POA: Insufficient documentation

## 2024-01-30 DIAGNOSIS — I358 Other nonrheumatic aortic valve disorders: Secondary | ICD-10-CM | POA: Diagnosis not present

## 2024-01-30 DIAGNOSIS — G4733 Obstructive sleep apnea (adult) (pediatric): Secondary | ICD-10-CM | POA: Diagnosis not present

## 2024-01-30 DIAGNOSIS — I10 Essential (primary) hypertension: Secondary | ICD-10-CM | POA: Insufficient documentation

## 2024-01-30 DIAGNOSIS — Z7982 Long term (current) use of aspirin: Secondary | ICD-10-CM | POA: Diagnosis not present

## 2024-01-30 HISTORY — PX: LEFT HEART CATH AND CORONARY ANGIOGRAPHY: CATH118249

## 2024-01-30 SURGERY — LEFT HEART CATH AND CORONARY ANGIOGRAPHY
Anesthesia: LOCAL

## 2024-01-30 MED ORDER — SODIUM CHLORIDE 0.9 % IV SOLN: 250.0000 mL | INTRAVENOUS | Status: DC | PRN

## 2024-01-30 MED ORDER — FENTANYL CITRATE (PF) 100 MCG/2ML IJ SOLN
INTRAMUSCULAR | Status: DC | PRN
  Administered 2024-01-30: 25 ug via INTRAVENOUS

## 2024-01-30 MED ORDER — VERAPAMIL HCL 2.5 MG/ML IV SOLN
INTRAVENOUS | Status: AC
Start: 2024-01-30 — End: 2024-01-30
  Filled 2024-01-30: qty 2

## 2024-01-30 MED ORDER — HEPARIN (PORCINE) IN NACL 1000-0.9 UT/500ML-% IV SOLN
INTRAVENOUS | Status: DC | PRN
Start: 2024-01-30 — End: 2024-01-30
  Administered 2024-01-30: 1000 mL via SURGICAL_CAVITY

## 2024-01-30 MED ORDER — SODIUM CHLORIDE 0.9% FLUSH: 3.0000 mL | Freq: Two times a day (BID) | INTRAVENOUS | Status: DC

## 2024-01-30 MED ORDER — LIDOCAINE HCL (PF) 1 % IJ SOLN
INTRAMUSCULAR | Status: DC | PRN
  Administered 2024-01-30: 2 mL

## 2024-01-30 MED ORDER — HEPARIN SODIUM (PORCINE) 1000 UNIT/ML IJ SOLN
INTRAMUSCULAR | Status: AC
  Filled 2024-01-30: qty 10

## 2024-01-30 MED ORDER — CLOPIDOGREL BISULFATE 75 MG PO TABS: 75.0000 mg | ORAL_TABLET | ORAL | Status: DC

## 2024-01-30 MED ORDER — MIDAZOLAM HCL 2 MG/2ML IJ SOLN
INTRAMUSCULAR | Status: AC
  Filled 2024-01-30: qty 2

## 2024-01-30 MED ORDER — VERAPAMIL HCL 2.5 MG/ML IV SOLN
INTRAVENOUS | Status: DC | PRN
  Administered 2024-01-30: 10 mL via INTRA_ARTERIAL

## 2024-01-30 MED ORDER — ASPIRIN 81 MG PO CHEW: 81.0000 mg | CHEWABLE_TABLET | ORAL | Status: DC

## 2024-01-30 MED ORDER — HEPARIN SODIUM (PORCINE) 1000 UNIT/ML IJ SOLN
INTRAMUSCULAR | Status: DC | PRN
  Administered 2024-01-30: 6000 [IU] via INTRAVENOUS

## 2024-01-30 MED ORDER — IOHEXOL 350 MG/ML SOLN
INTRAVENOUS | Status: DC | PRN
  Administered 2024-01-30: 30 mL

## 2024-01-30 MED ORDER — FENTANYL CITRATE (PF) 100 MCG/2ML IJ SOLN
INTRAMUSCULAR | Status: AC
Start: 2024-01-30 — End: 2024-01-30
  Filled 2024-01-30: qty 2

## 2024-01-30 MED ORDER — MIDAZOLAM HCL 2 MG/2ML IJ SOLN
INTRAMUSCULAR | Status: DC | PRN
  Administered 2024-01-30: 1 mg via INTRAVENOUS

## 2024-01-30 MED ORDER — LIDOCAINE HCL (PF) 1 % IJ SOLN
INTRAMUSCULAR | Status: AC
  Filled 2024-01-30: qty 30

## 2024-01-30 MED ORDER — FREE WATER: 500.0000 mL | Freq: Once | Status: DC

## 2024-01-30 MED ORDER — SODIUM CHLORIDE 0.9% FLUSH: 3.0000 mL | INTRAVENOUS | Status: DC | PRN

## 2024-01-30 SURGICAL SUPPLY — 6 items
CATH 5FR JL3.5 JR4 ANG PIG MP (CATHETERS) IMPLANT
DEVICE RAD COMP TR BAND LRG (VASCULAR PRODUCTS) IMPLANT
GLIDESHEATH SLEND SS 6F .021 (SHEATH) IMPLANT
GUIDEWIRE INQWIRE 1.5J.035X260 (WIRE) IMPLANT
PACK CARDIAC CATHETERIZATION (CUSTOM PROCEDURE TRAY) ×1 IMPLANT
SET ATX-X65L (MISCELLANEOUS) IMPLANT

## 2024-01-30 NOTE — Interval H&P Note (Signed)
 History and Physical Interval Note:  01/30/2024 2:43 PM  ROCHELLE NEPHEW  has presented today for surgery, with the diagnosis of angina.  The various methods of treatment have been discussed with the patient and family. After consideration of risks, benefits and other options for treatment, the patient has consented to  Procedure(s): LEFT HEART CATH AND CORONARY ANGIOGRAPHY (N/A) as a surgical intervention.  The patient's history has been reviewed, patient examined, no change in status, stable for surgery.  I have reviewed the patient's chart and labs.  Questions were answered to the patient's satisfaction.    Cath Lab Visit (complete for each Cath Lab visit)  Clinical Evaluation Leading to the Procedure:   ACS: No.  Non-ACS:    Anginal Classification: CCS II  Anti-ischemic medical therapy: Maximal Therapy (2 or more classes of medications)  Non-Invasive Test Results: No non-invasive testing performed  Prior CABG: No previous CABG    Lonni Cash

## 2024-01-30 NOTE — Discharge Instructions (Signed)

## 2024-02-24 DIAGNOSIS — Z961 Presence of intraocular lens: Secondary | ICD-10-CM | POA: Diagnosis not present

## 2024-03-03 ENCOUNTER — Encounter: Payer: Self-pay | Admitting: Sports Medicine

## 2024-03-17 DIAGNOSIS — I25118 Atherosclerotic heart disease of native coronary artery with other forms of angina pectoris: Secondary | ICD-10-CM | POA: Diagnosis not present

## 2024-03-17 DIAGNOSIS — J302 Other seasonal allergic rhinitis: Secondary | ICD-10-CM | POA: Diagnosis not present

## 2024-03-17 DIAGNOSIS — I10 Essential (primary) hypertension: Secondary | ICD-10-CM | POA: Diagnosis not present

## 2024-03-17 DIAGNOSIS — I252 Old myocardial infarction: Secondary | ICD-10-CM | POA: Diagnosis not present

## 2024-03-17 DIAGNOSIS — E782 Mixed hyperlipidemia: Secondary | ICD-10-CM | POA: Diagnosis not present

## 2024-04-01 DIAGNOSIS — S80812A Abrasion, left lower leg, initial encounter: Secondary | ICD-10-CM | POA: Diagnosis not present

## 2024-04-01 DIAGNOSIS — M79662 Pain in left lower leg: Secondary | ICD-10-CM | POA: Diagnosis not present

## 2024-04-09 DIAGNOSIS — Z1211 Encounter for screening for malignant neoplasm of colon: Secondary | ICD-10-CM | POA: Diagnosis not present

## 2024-04-09 DIAGNOSIS — Z23 Encounter for immunization: Secondary | ICD-10-CM | POA: Diagnosis not present

## 2024-04-09 DIAGNOSIS — M7989 Other specified soft tissue disorders: Secondary | ICD-10-CM | POA: Diagnosis not present

## 2024-04-27 ENCOUNTER — Ambulatory Visit: Admitting: Nurse Practitioner

## 2024-05-07 NOTE — Progress Notes (Unsigned)
 Cardiology Office Note    Date:  05/08/2024  ID:  Craig Copeland Copeland, DOB 1956-06-14, MRN [id number] PCP:  Clarice Nottingham, MD  Cardiologist:  Jerel Balding, MD  Electrophysiologist:  None   Chief Complaint: Follow up for CAD   History of Present Illness: Craig Copeland    Craig Copeland Copeland is a 68 y.o. male with visit-pertinent history of coronary artery disease, hypertension and hyperlipidemia.  Patient has history of previous stent to the RCA in 2004 with a DES overlapping another DES in the mid proximal RCA and a moderate to severe ostial lesion of a small to medium ramus intermedius artery that was too small for PCI.  On LHC in 2018 he had no changes.   In July, 2024 he presented to [facility] health with nitrate responsive angina.  He did not have an elevated troponin or ischemic ECG changes.  Cardiac catheterization showed high-grade stenosis of the right coronary artery.  He had an unsuccessful attempt at revascularization on 01/01/2023.  On 01/31/2023 chronic total occlusion team attempted to open up his subtotal occlusion of the proximal to mid RCA at the level of 2 previously placed stents.  They were unable to cross the lesion with a balloon.  They did stent the proximal right RCA after a guide provoked proximal vessel dissection.   He was last seen in clinic by Dr. Balding on 06/18/2023.  He was having some angina that was consistent with stable angina.  He was able to work for an hour before developing chest discomfort, resolving with rest after a few minutes.  His edema was well-controlled and he really had symptoms of orthostatic dizziness.  Denied palpitations or syncope.  Was unfortunately unable to tolerate treatment with GLP-1 agonist due to GI side effects.  Patient was last seen in clinic on 01/24/2024 by Dr. Balding, it was noted that he had problems with exertional related angina that had significantly elevated in the prior 2 months to appointment.  He noted that even with light physical  activity such as walking to the car from the house seems to bring on chest pain.  He noted he rarely needed to take a second sublingual nitroglycerin .  Patient's Imdur  was increased to 60 mg daily, amlodipine  increased to 10 mg daily and losartan  discontinued.  Cardiac catheterization on 01/30/2024 indicated stable CAD with mild nonobstructive disease in the LAD, small caliber intermediate branch with several ostial stenosis, unchanged from last cath in 2 small for PCI, large dominant RCA with patent proximal stent and chronic occlusion of the mid vessel stented segment, PDA and post area lateral branch fills from left-to-right collaterals.  Encouraged to continue medical management of CAD.  Today he presents for follow up. He reports that he has been stable, continues to note some chest pain with exertion such as when walking a job site.  He reports that typically when he has chest pain it is more of a tightness not a true pain, will have some associated shortness of breath, discomfort is relieved usually with rest.  He does note that he did have take a single sublingual nitroglycerin  a week and a half ago with complete resolution of symptoms, notes the last nitroglycerin  use prior to this was a month to 2 months prior.  Patient reports he does not feel that he is necessarily worsening but does not feel as though he has been improving.  He denies any chest pain at rest or shortness of breath at rest.  Patient notes that his  activity is limited as well because of hip pain, he has been trying to have a hip surgery completed for the last 2 years however has been repeatedly delayed as a result of his CAD and recurrent chest pain.  Patient reports that his blood pressure is very well-controlled at home, tries to eat a heart healthy diet and is currently on Wegovy however does not feel he is had significant weight loss.  Patient denies any significant lower extremity edema, notes that he did have some left lower  extremity swelling after a fall a month ago resulting in a large wound to the left shin, he notes that this is healing.  He denies any orthopnea or PND.  He is unable to use his CPAP as a result of sinus drainage which results in significant cough with mucus production in the morning.  ROS: .   Today he denies lower extremity edema, fatigue, palpitations, melena, hematuria, hemoptysis, diaphoresis, weakness, presyncope, syncope, orthopnea, and PND.  All other systems are reviewed and otherwise negative. Studies Reviewed: Craig Copeland   EKG:  EKG is ordered today, personally reviewed, demonstrating  EKG Interpretation Date/Time:  Friday May 08 2024 13:03:48 EST Ventricular Rate:  71 PR Interval:  210 QRS Duration:  92 QT Interval:  384 QTC Calculation: 417 R Axis:   8  Text Interpretation: Sinus rhythm with 1st degree A-V block Inferior infarct (cited on or before 31-Jan-2023) When compared with ECG of 24-Jan-2024 08:33, No significant change was found Confirmed by Adler Alton [phone]) on 05/08/2024 1:19:37 PM   CV Studies: Cardiac studies reviewed are outlined and summarized above. Otherwise please see EMR for full report. Cardiac Studies & Procedures   ______________________________________________________________________________________________ CARDIAC CATHETERIZATION  CARDIAC CATHETERIZATION 01/30/2024  Conclusion   Mid LAD lesion is 20% stenosed.   Prox LAD lesion is 20% stenosed.   Ost Ramus lesion is 70% stenosed.   Ost 1st Diag to 1st Diag lesion is 20% stenosed.   Prox RCA to Mid RCA lesion is 100% stenosed.   Ramus lesion is 70% stenosed.   2nd Diag lesion is 20% stenosed.   Previously placed Prox RCA stent of unknown type is  widely patent.  Stable CAD Mild non-obstructive disease in the LAD Small caliber intermediate branch with severe ostial stenosis, unchanged from last cath and too small for PCI Large dominant RCA with patent proximal stent and chronic occlusion of the mid  vessel stented segment. The PDA and posterolateral branch fill from left to right collaterals.  Continue medical management of CAD  Findings Coronary Findings Diagnostic  Dominance: Right  Left Anterior Descending Vessel is large. Prox LAD lesion is 20% stenosed. Mid LAD lesion is 20% stenosed. Segment of intramyocardial bridging.  First Diagonal Branch Vessel is moderate in size. Ost 1st Diag to 1st Diag lesion is 20% stenosed.  First Septal Branch Vessel is small in size.  Second Diagonal Branch Vessel is small in size. 2nd Diag lesion is 20% stenosed.  Second Septal Branch Vessel is small in size.  Third Diagonal Branch Vessel is small in size.  Third Septal Branch Vessel is small in size.  Ramus Intermedius Vessel is small. Ost Ramus lesion is 70% stenosed. Ramus lesion is 70% stenosed.  Left Circumflex Vessel is large.  Second Obtuse Marginal Branch Vessel is large in size.  Third Obtuse Marginal Branch Vessel is small in size.  Right Coronary Artery Previously placed Prox RCA stent of unknown type is  widely patent. Prox RCA to Mid RCA lesion  is 100% stenosed. The lesion was previously treated using a drug eluting stent over 2 years ago.  Third Right Posterolateral Branch Collaterals 3rd RPL filled by collaterals from 2nd Sept.  Intervention  No interventions have been documented.   CARDIAC CATHETERIZATION  CARDIAC CATHETERIZATION 01/31/2023  Conclusion   Ost RCA to Prox RCA lesion is 25% stenosed.   Prox RCA to Mid RCA lesion is 99% stenosed.   A drug-eluting stent was successfully placed using a SYNERGY XD 3.0X16.   Post intervention, there is a 0% residual stenosis.   Post intervention, there is a 99% residual stenosis.   Recommend uninterrupted dual antiplatelet therapy with Aspirin  81mg  daily and Clopidogrel  75mg  daily for a minimum of 6 months (stable ischemic heart disease-Class I recommendation).  Unsuccessful CTO PCI of the mid RCA.  Inability to cross with a balloon. Wire appeared to cross but was likely subintimal throughout. Stenting of the proximal RCA to cover a guide induced vessel dissection  Plan: continue medical therapy. Would need a minimum of 8 weeks for vessel to heal and given multiple attempts to cross unsuccessfully now I think reattempt at PCI is unlikely to be successful.  Findings Coronary Findings Diagnostic  Dominance: Right  Right Coronary Artery Ost RCA to Prox RCA lesion is 25% stenosed. Prox RCA to Mid RCA lesion is 99% stenosed. The lesion is severely calcified. The lesion was previously treated .  Intervention  Ost RCA to Prox RCA lesion Stent A drug-eluting stent was successfully placed using a SYNERGY XD 3.0X16. Stent strut is well apposed. Post-stent angioplasty was performed using a BALLN [place] EMERGE MR [id number]. Maximum pressure:  16 atm. Stent placed to cover proximal vessel dissection. Post-Intervention Lesion Assessment The intervention was successful. Pre-interventional TIMI flow is 3. Post-intervention TIMI flow is 3. No complications occurred at this lesion. There is a 0% residual stenosis post intervention.  Prox RCA to Mid RCA lesion Angioplasty CATH MACH1 105F AL1 90CM guide catheter was inserted. WIRE ASAHI FIELDER XT 190CM guidewire used to cross lesion. Post-Intervention Lesion Assessment The intervention was unsuccessful due to inability to cross the lesion with balloon. Pre-interventional TIMI flow is 2. Post-intervention TIMI flow is 2. At this lesion, a dissection occurred. There is a 99% residual stenosis post intervention.   STRESS TESTS  MYOCARDIAL PERFUSION IMAGING 04/14/2015  Interpretation Summary  The left ventricular ejection fraction is normal (55-65%).  Nuclear stress EF: 60%.  There was no ST segment deviation noted during stress.  Defect 1: There is a small defect of mild severity present in the basal inferior and mid inferior location. This is  likely due to diaphragmatic attenuation, but cannot rule out infarct with mild peri-infarct ischemia.  The study is normal.  This is a low risk study.   ECHOCARDIOGRAM  ECHOCARDIOGRAM COMPLETE 04/13/2015  Narrative *[facility]* [address] [place], [place] [address] [phone]  ------------------------------------------------------------------- Transthoracic Echocardiography  Patient:    Mallory, Enriques MR #:       [id number] Study Date: 04/13/2015 Gender:     M Age:        58 Height:     185.4 cm Weight:     138.3 kg BSA:        2.73 m^2 Pt. Status: Room:  ORDERING     Jerel Balding, MD REFERRING    Jerel Balding, MD SONOGRAPHER  Waldo [place], RCS PERFORMING   [facility], Outpatient ATTENDING    Nahser, Jr  cc:  ------------------------------------------------------------------- LV EF: 55% -  60%  ------------------------------------------------------------------- Indications:      785.2 Cardiac murmur (R01.1).  ------------------------------------------------------------------- Study Conclusions  - Left ventricle: The cavity size was normal. Wall thickness was increased in a pattern of mild LVH. Systolic function was normal. The estimated ejection fraction was in the range of 55% to 60%. Wall motion was normal; there were no regional wall motion abnormalities. Left ventricular diastolic function parameters were normal. - Aortic valve: Mildly calcified annulus. Trileaflet. - Mitral valve: There was trivial regurgitation. - Left atrium: The atrium was at the upper limits of normal in size. - Right atrium: Central venous pressure (est): 3 mm Hg. - Pulmonary arteries: Systolic pressure could not be accurately estimated. - Pericardium, extracardiac: There was no pericardial effusion.  Impressions:  - Mild LVH with LVEF 55-60% and grossly normal diastolic function. Upper normal left atrial chamber size. Aortic valve is  mildly sclerotic.  Transthoracic echocardiography.  M-mode, complete 2D, spectral Doppler, and color Doppler.  Birthdate:  Patient birthdate: 1956/03/11.  Age:  Patient is 68 yr old.  Sex:  Gender: male. BMI: 40.2 kg/m^2.  Blood pressure:     160/67  Patient status: Outpatient.  Study date:  Study date: 04/13/2015. Study time: 11:22 AM.  Location:  [facility]  -------------------------------------------------------------------  ------------------------------------------------------------------- Left ventricle:  The cavity size was normal. Wall thickness was increased in a pattern of mild LVH. Systolic function was normal. The estimated ejection fraction was in the range of 55% to 60%. Wall motion was normal; there were no regional wall motion abnormalities. Left ventricular diastolic function parameters were normal.  ------------------------------------------------------------------- Aortic valve:   Mildly calcified annulus. Trileaflet. Cusp separation was normal.  Doppler:  There was no significant regurgitation.  ------------------------------------------------------------------- Aorta:  Aortic root: The aortic root was normal in size.  ------------------------------------------------------------------- Mitral valve:   The valve appears to be grossly normal.    Doppler: There was trivial regurgitation.    Peak gradient (D): 3 mm Hg.  ------------------------------------------------------------------- Left atrium:  The atrium was at the upper limits of normal in size.  ------------------------------------------------------------------- Right ventricle:  The cavity size was normal. Systolic pressure was within the normal range.  ------------------------------------------------------------------- Pulmonic valve:    The valve appears to be grossly normal. Doppler:  There was physiologic  regurgitation.  ------------------------------------------------------------------- Tricuspid valve:   The valve appears to be grossly normal. Doppler:  There was trivial regurgitation.  ------------------------------------------------------------------- Pulmonary artery:    Systolic pressure could not be accurately estimated.  ------------------------------------------------------------------- Right atrium:  The atrium was normal in size.  ------------------------------------------------------------------- Pericardium:  There was no pericardial effusion.  ------------------------------------------------------------------- Systemic veins: Inferior vena cava: The vessel was normal in size. The respirophasic diameter changes were in the normal range (= 50%), consistent with normal central venous pressure. Diameter: 15 mm.  ------------------------------------------------------------------- Measurements  IVC                                         Value        Reference ID                                          15    mm     ---------  Left ventricle  Value        Reference LV ID, ED, PLAX chordal                     45.4  mm     43 - 52 LV ID, ES, PLAX chordal                     31    mm     23 - 38 LV fx shortening, PLAX chordal              32    %      >=29 LV PW thickness, ED                         12.9  mm     --------- IVS/LV PW ratio, ED                         0.91         <=1.3 Stroke volume, 2D                           97    ml     --------- Stroke volume/bsa, 2D                       36    ml/m^2 --------- LV e&', lateral                              8.88  cm/s   --------- LV E/e&', lateral                            9.67         --------- LV e&', medial                               10.7  cm/s   --------- LV E/e&', medial                             8.03         --------- LV e&', average                              9.79  cm/s    --------- LV E/e&', average                            8.77         ---------  Ventricular septum                          Value        Reference IVS thickness, ED                           11.7  mm     ---------  LVOT  Value        Reference LVOT ID, S                                  23    mm     --------- LVOT area                                   4.15  cm^2   --------- LVOT peak velocity, S                       112   cm/s   --------- LVOT mean velocity, S                       76.8  cm/s   --------- LVOT VTI, S                                 23.4  cm     --------- LVOT peak gradient, S                       5     mm Hg  ---------  Aorta                                       Value        Reference Aortic root ID, ED                          33    mm     ---------  Left atrium                                 Value        Reference LA ID, A-P, ES                              41    mm     --------- LA ID/bsa, A-P                              1.5   cm/m^2 <=2.2 LA volume, S                                69    ml     --------- LA volume/bsa, S                            25.3  ml/m^2 --------- LA volume, ES, 1-p A4C                      67    ml     --------- LA volume/bsa, ES, 1-p A4C                  24.6  ml/m^2 --------- LA volume, ES, 1-p A2C  69    ml     --------- LA volume/bsa, ES, 1-p A2C                  25.3  ml/m^2 ---------  Mitral valve                                Value        Reference Mitral E-wave peak velocity                 85.9  cm/s   --------- Mitral A-wave peak velocity                 77    cm/s   --------- Mitral deceleration time                    173   ms     150 - 230 Mitral peak gradient, D                     3     mm Hg  --------- Mitral E/A ratio, peak                      1.12         ---------  Tricuspid valve                             Value        Reference Tricuspid regurg peak  velocity              113   cm/s   --------- Tricuspid peak RV-RA gradient               5     mm Hg  --------- Tricuspid maximal regurg velocity,          113   cm/s   --------- PISA  Systemic veins                              Value        Reference Estimated CVP                               3     mm Hg  ---------  Right ventricle                             Value        Reference RV pressure, S, DP                          8     mm Hg  <=30 RV s&', lateral, S                           12.4  cm/s   ---------  Legend: (L)  and  (H)  mark values outside specified reference range.  ------------------------------------------------------------------- Prepared and Electronically Authenticated by  Jayson Sierras, M.D. 2016-10-12T15:55:34          ______________________________________________________________________________________________       Current Reported Medications:.    Current Meds  Medication Sig   acetaminophen  (TYLENOL )  500 MG tablet Take 500 mg by mouth as needed for moderate pain.   amLODipine  (NORVASC ) 10 MG tablet Take 1 tablet (10 mg total) by mouth daily.   aspirin  EC 81 MG tablet Take 81 mg by mouth daily.   carvedilol  (COREG ) 6.25 MG tablet Take 1 tablet (6.25 mg total) by mouth 2 (two) times daily.   cetirizine (ZYRTEC) 10 MG chewable tablet 1 tablet Orally Once a day; Duration: 30 day(s)   cholecalciferol  (VITAMIN D ) 1000 units tablet Take 1,000 Units by mouth daily.   clopidogrel  (PLAVIX ) 75 MG tablet Take 75 mg by mouth daily.   ezetimibe  (ZETIA ) 10 MG tablet Take 10 mg by mouth every evening.    fish oil-omega-3 fatty acids 1000 MG capsule Take 1 g by mouth daily.    isosorbide  mononitrate (IMDUR ) 60 MG 24 hr tablet Take 1 tablet (60 mg total) by mouth daily.   nitroGLYCERIN  (NITROSTAT ) 0.4 MG SL tablet PLACE 1 TABLET UNDER THE TONGUE EVERY 5 MINUTES AS NEEDED FOR CHEST PAIN.   pravastatin  (PRAVACHOL ) 80 MG tablet Take 80 mg by mouth at bedtime.     ranolazine  (RANEXA ) 1000 MG SR tablet Take 1 tablet (1,000 mg total) by mouth 2 (two) times daily.   semaglutide-weight management (WEGOVY) 1 MG/0.5ML SOAJ SQ injection Inject 1 mg into the skin.    Physical Exam:    VS:  BP 128/78   Pulse 65   Ht 6' 1 (1.854 m)   Wt (!) 313 lb 6.4 oz (142.2 kg)   SpO2 98%   BMI 41.35 kg/m    Wt Readings from Last 3 Encounters:  05/08/24 (!) 313 lb 6.4 oz (142.2 kg)  01/30/24 (!) 304 lb (137.9 kg)  01/24/24 (!) 308 lb (139.7 kg)    GEN: Well nourished, well developed in no acute distress NECK: No JVD; No carotid bruits CARDIAC: RRR, 1/6 systolic ejection murmur, no rubs or gallops RESPIRATORY:  Clear to auscultation without rales, wheezing or rhonchi  ABDOMEN: Soft, non-tender, non-distended EXTREMITIES:  No edema; No acute deformity     Asessement and Plan:.    CAD: S/p PCI with overlapping stents x 2 to mid RCA in May 2004. LHC in March 2018 showed patent stents. LHC performed at [facility] health showed 99 to 1% mid RCA stenosis. PCI attempts x 2 at [facility] health were unsuccessful. CTO intervention by Dr. Wonda was also unsuccessful. Patient did have DES placed to proximal RCA secondary to guide induced dissection. Patient has bridging collaterals to the distal vessel.  Patient underwent left heart catheterization on 01/30/2024 with Dr. Wonda with concerns for progressive angina, cath indicated stable CAD with recommendation to continue medical management.  Noted to have chronic occlusion of mid vessel of the RCA however was receiving left-to-right collaterals from PDA and posterior lateral branch.  Today patient reports that he is stable, he continues to note episodes of chest discomfort and shortness of breath with prolonged exertion.  He notes that he will occasionally need to take a sublingual nitroglycerin , reports last nitroglycerin  was a week and a half ago prior to this was 1 to 2 months prior.  He reports that typically his discomfort and  shortness of breath resolves quickly with rest.  Patient feels that he has remained stable.  Will increase patient's Imdur  to 90 mg daily.  Check echocardiogram. Reviewed ED precautions. Continue amlodipine  10 mg daily, aspirin  81 mg daily, carvedilol  6.25 mg twice daily, Plavix  75 mg daily, Zetia  10 mg daily, pravastatin   80 mg daily and Ranexa  1000 mg twice daily. Patient reports that he has been having trying to have hip replacement for the last 2 years however has been delayed as a result of his recurrent chest pain, will reach out to Dr. Francyne regarding recommendations.  Hyperlipidemia: Last lipid profile in 01/24/2024 indicated total cholesterol 120, HDL 37, triglycerides 124 and LDL 61.  Patient reports that he is to have fasting labs completed on Monday for his annual physical with his PCP.  Continue pravastatin  80 mg daily.  Hypertension: Blood pressure today 120/78.  Will increase patient's Imdur  to 90 mg daily as noted above.  Continue carvedilol  6.25 mg twice daily, amlodipine  10 mg daily.  Obesity: Patient notes that he is currently on Wegovy, has not had significant weight loss.  He reports that his activity is limited as result of his symptoms and his ongoing hip pain.  OSA: Unable to use CPAP as a result of sinus drainage.    Disposition: F/u with Dr. Francyne or Akim Watkinson, NP in 2-3 months or sooner if needed.   Signed, Shams Fill D Gal Smolinski, NP

## 2024-05-08 ENCOUNTER — Encounter: Payer: Self-pay | Admitting: Cardiology

## 2024-05-08 ENCOUNTER — Ambulatory Visit: Attending: Cardiology | Admitting: Cardiology

## 2024-05-08 VITALS — BP 128/78 | HR 65 | Ht 73.0 in | Wt 313.4 lb

## 2024-05-08 DIAGNOSIS — G4733 Obstructive sleep apnea (adult) (pediatric): Secondary | ICD-10-CM

## 2024-05-08 DIAGNOSIS — I25118 Atherosclerotic heart disease of native coronary artery with other forms of angina pectoris: Secondary | ICD-10-CM

## 2024-05-08 DIAGNOSIS — E785 Hyperlipidemia, unspecified: Secondary | ICD-10-CM

## 2024-05-08 DIAGNOSIS — I10 Essential (primary) hypertension: Secondary | ICD-10-CM

## 2024-05-08 MED ORDER — ISOSORBIDE MONONITRATE ER 30 MG PO TB24: 30.0000 mg | ORAL_TABLET | Freq: Every day | ORAL | 3 refills | Status: DC

## 2024-05-08 NOTE — Patient Instructions (Addendum)
 Medication Instructions:   INCREASE Isosorbide  (Imdur ) to 90 mg, take one (1) 60 mg tablet by mouth daily AND one (1) 30 mg tablet by mouth daily to equal 90 mg total once daily.  *If you need a refill on your cardiac medications before your next appointment, please call your pharmacy*  Lab Work:  None ordered  If you have labs (blood work) drawn today and your tests are completely normal, you will receive your results only by: MyChart Message (if you have MyChart) OR A paper copy in the mail If you have any lab test that is abnormal or we need to change your treatment, we will call you to review the results.  Testing/Procedures:  Your physician has requested that you have an echocardiogram. Echocardiography is a painless test that uses sound waves to create images of your heart. It provides your doctor with information about the size and shape of your heart and how well your heart's chambers and valves are working. This procedure takes approximately one hour. There are no restrictions for this procedure. Please do NOT wear cologne, perfume, aftershave, or lotions (deodorant is allowed). Please arrive 15 minutes prior to your appointment time.  Please note: We ask at that you not bring children with you during ultrasound (echo/ vascular) testing. Due to room size and safety concerns, children are not allowed in the ultrasound rooms during exams. Our front office staff cannot provide observation of children in our lobby area while testing is being conducted. An adult accompanying a patient to their appointment will only be allowed in the ultrasound room at the discretion of the ultrasound technician under special circumstances. We apologize for any inconvenience.   Follow-Up: At [facility], you and your health needs are our priority.  As part of our continuing mission to provide you with exceptional heart care, our providers are all part of one team.  This team includes your primary  Cardiologist (physician) and Advanced Practice Providers or APPs (Physician Assistants and Nurse Practitioners) who all work together to provide you with the care you need, when you need it.  Your next appointment:   2-3 month(s)  Provider:   Jerel Balding, MD    We recommend signing up for the patient portal called MyChart.  Sign up information is provided on this After Visit Summary.  MyChart is used to connect with patients for Virtual Visits (Telemedicine).  Patients are able to view lab/test results, encounter notes, upcoming appointments, etc.  Non-urgent messages can be sent to your provider as well.   To learn more about what you can do with MyChart, go to [email or web address].

## 2024-05-09 NOTE — Progress Notes (Signed)
 I think he is just going to need to have his hip surgery.  Not much else I can offer.

## 2024-05-11 LAB — LAB REPORT - SCANNED
A1c: 5.4
Albumin, Urine POC: 25.4
Albumin/Creatinine Ratio, Urine, POC: 9
Creatinine, POC: 280.2 mg/dL
EGFR: 65

## 2024-05-15 ENCOUNTER — Telehealth: Payer: Self-pay | Admitting: Cardiology

## 2024-05-15 NOTE — Telephone Encounter (Signed)
 Spoke with pt. Since starting increased imdur  he feels slower than normal. States he can't get around like he was and feels DOE sooner. States his BP and HR are normal. States he feels like he maybe coughing more. Denies weight gain nor swelling. Pt did not sound SOB over phone and states he is not SOB while sitting, it is when he has been moving around a while. Gave 911/ED precautions for the weekend and advised I would send over to Katlyn West, NP to review.

## 2024-05-15 NOTE — Telephone Encounter (Signed)
 Pt c/o Shortness Of Breath: STAT if SOB developed within the last 24 hours or pt is noticeably SOB on the phone  1. Are you currently SOB (can you hear that pt is SOB on the phone)? N/A  2. How long have you been experiencing SOB? A week   3. Are you SOB when sitting or when up moving around? Both  4. Are you currently experiencing any other symptoms? A little chest pain one day last week

## 2024-05-18 NOTE — Telephone Encounter (Signed)
 Pt returning call. Please advise.

## 2024-05-18 NOTE — Telephone Encounter (Signed)
 I spoke with the patient and gave him Katlyn West's medication instructions. He understood and stated he will decrease his Imdur  back to 60 mg daily and that he will continue to monitor his symptoms. I took off the 30 mg tablet of Imdur  from his med list as well.

## 2024-05-20 NOTE — Progress Notes (Unsigned)
   LILLETTE Ileana Collet, PhD, LAT, ATC acting as a scribe for Artist Lloyd, MD.  Craig Copeland is a 68 y.o. male who presents to [facility] at [facility] today for exacerbation of his bilat hip and R shoulder pain. Pt was last seen by Dr. Lloyd on 12/09/23 and was given a R interarticular and R subacromial steroid injections. Pt was advised to f/u w/ Dr. Jerri.  Today, pt reports ***  Dx testing: 09/04/22 R shoulder MRI 05/16/22 R hip & R shoulder XR   Pertinent review of systems: ***  Relevant historical information: ***   Exam:  There were no vitals taken for this visit. General: Well Developed, well nourished, and in no acute distress.   MSK: ***    Lab and Radiology Results No results found for this or any previous visit (from the past 72 hours). No results found.     Assessment and Plan: 68 y.o. male with ***   PDMP not reviewed this encounter. No orders of the defined types were placed in this encounter.  No orders of the defined types were placed in this encounter.    Discussed warning signs or symptoms. Please see discharge instructions. Patient expresses understanding.   ***

## 2024-05-21 ENCOUNTER — Other Ambulatory Visit: Payer: Self-pay

## 2024-05-21 ENCOUNTER — Ambulatory Visit: Admitting: Family Medicine

## 2024-05-21 ENCOUNTER — Encounter: Payer: Self-pay | Admitting: Family Medicine

## 2024-05-21 VITALS — BP 118/76 | HR 74 | Ht 73.0 in | Wt 313.0 lb

## 2024-05-21 DIAGNOSIS — M25511 Pain in right shoulder: Secondary | ICD-10-CM | POA: Diagnosis not present

## 2024-05-21 DIAGNOSIS — M25552 Pain in left hip: Secondary | ICD-10-CM

## 2024-05-21 DIAGNOSIS — M25551 Pain in right hip: Secondary | ICD-10-CM | POA: Diagnosis not present

## 2024-05-21 DIAGNOSIS — G8929 Other chronic pain: Secondary | ICD-10-CM | POA: Diagnosis not present

## 2024-05-21 NOTE — Patient Instructions (Addendum)
 Thank you for coming in today.  You received an injection today. Seek immediate medical attention if the joint becomes red, extremely painful, or is oozing fluid.  See you back in 3 months.

## 2024-06-15 ENCOUNTER — Ambulatory Visit (HOSPITAL_COMMUNITY): Admission: RE | Admit: 2024-06-15 | Discharge: 2024-06-15 | Attending: Cardiology

## 2024-06-15 ENCOUNTER — Other Ambulatory Visit: Payer: Self-pay | Admitting: Cardiovascular Disease

## 2024-06-15 DIAGNOSIS — I25118 Atherosclerotic heart disease of native coronary artery with other forms of angina pectoris: Secondary | ICD-10-CM | POA: Insufficient documentation

## 2024-06-15 DIAGNOSIS — I25110 Atherosclerotic heart disease of native coronary artery with unstable angina pectoris: Secondary | ICD-10-CM | POA: Diagnosis not present

## 2024-06-16 LAB — ECHOCARDIOGRAM COMPLETE
AR max vel: 2.53 cm2
AV Area VTI: 2.5 cm2
AV Area mean vel: 2.39 cm2
AV Mean grad: 8.8 mmHg
AV Peak grad: 16.2 mmHg
Ao pk vel: 2.01 m/s
Area-P 1/2: 3.14 cm2
P 1/2 time: 658 ms
S' Lateral: 2.8 cm

## 2024-06-19 ENCOUNTER — Ambulatory Visit: Payer: Self-pay | Admitting: Cardiology

## 2024-06-22 NOTE — Telephone Encounter (Signed)
 Results were called and read to the patient, no additional questions at the given time.

## 2024-07-22 ENCOUNTER — Encounter: Payer: Self-pay | Admitting: Pulmonary Disease

## 2024-07-22 ENCOUNTER — Ambulatory Visit

## 2024-07-22 ENCOUNTER — Ambulatory Visit: Admitting: Pulmonary Disease

## 2024-07-22 VITALS — BP 124/74 | HR 72 | Temp 98.2°F | Ht 73.0 in | Wt 293.4 lb

## 2024-07-22 DIAGNOSIS — E785 Hyperlipidemia, unspecified: Secondary | ICD-10-CM

## 2024-07-22 DIAGNOSIS — E669 Obesity, unspecified: Secondary | ICD-10-CM

## 2024-07-22 DIAGNOSIS — R0602 Shortness of breath: Secondary | ICD-10-CM

## 2024-07-22 DIAGNOSIS — G4733 Obstructive sleep apnea (adult) (pediatric): Secondary | ICD-10-CM

## 2024-07-22 DIAGNOSIS — R0609 Other forms of dyspnea: Secondary | ICD-10-CM

## 2024-07-22 DIAGNOSIS — I10 Essential (primary) hypertension: Secondary | ICD-10-CM | POA: Diagnosis not present

## 2024-07-22 DIAGNOSIS — I2510 Atherosclerotic heart disease of native coronary artery without angina pectoris: Secondary | ICD-10-CM | POA: Diagnosis not present

## 2024-07-22 MED ORDER — ALBUTEROL SULFATE HFA 108 (90 BASE) MCG/ACT IN AERS: 2.0000 | INHALATION_SPRAY | Freq: Four times a day (QID) | RESPIRATORY_TRACT | 6 refills | Status: AC | PRN

## 2024-07-22 NOTE — Progress Notes (Signed)
 "              Craig Copeland    [id number]    1955/10/14  Primary Care Physician:Pharr, Ryan, MD  Referring Physician: Clarice Ryan, MD [address] [place],  [place] [address]  Chief complaint:   Shortness of breath Ongoing for a year but worse in the last couple of months Treated for respiratory tract infection recently Concern with possible aspiration  Discussed the use of AI scribe software for clinical note transcription with the patient, who gave verbal consent to proceed.  History of Present Illness Craig Copeland is a 70 year old male with major heart problems who presents with shortness of breath and sleep apnea. He was referred by Dr. Andree for evaluation of his chronic shortness of breath and potential lung issues.  He has experienced shortness of breath for over a year, which worsened after an illness following Christmas. He describes feeling 'like I'm a half a breath behind' even while sitting. The shortness of breath intensifies with physical activity, such as walking or climbing stairs, where he can only manage one to one and a half flights before needing to rest. No chest pain is associated with the shortness of breath.  He has a significant cardiac history, including three heart attacks, three stents, and a blocked artery that cannot be treated. He is under the care of a cardiologist and is on medications including amlodipine  and carvedilol .  He has a history of sleep apnea, confirmed by a sleep study in 2022 showing oxygen levels dropping to 66% and 65 apneic events per hour. He previously used a CPAP machine for five to six years but discontinued due to nasal drainage issues. He wakes up two to three times per night to use the restroom and experiences mild morning dryness of the mouth.  He has never smoked and worked in environments such as a programme researcher, broadcasting/film/video and as a animator, with the last exposure to such environments being two years ago.  He denies any recent exposure to harmful environments.  He was on [facility] for three months, during which he lost 35 pounds but experienced severe gastrointestinal side effects, including persistent vomiting, leading to discontinuation of the medication.  He seems pretty limited with activities of daily living Trouble breathing sometimes even at rest Can only walk about a block Has a history of heart disease but his recent echo 06/15/2024 showed normal ejection fraction, some diastolic dysfunction  Past history of obstructive sleep apnea that is not being treated because he could not tolerate the mask -  he is willing to have this reevaluated  Goes to bed around 10 PM wakes up at 9 30-10 Couple of awakenings to go to the restroom Little dryness in the morning, no headaches, no night sweats That did have obstructive sleep apnea  Previous sleep study was discussed  Outpatient Encounter Medications as of 07/22/2024  Medication Sig   acetaminophen  (TYLENOL ) 500 MG tablet Take 500 mg by mouth as needed for moderate pain.   albuterol  (VENTOLIN  HFA) 108 (90 Base) MCG/ACT inhaler Inhale 2 puffs into the lungs every 6 (six) hours as needed for wheezing or shortness of breath.   amLODipine  (NORVASC ) 10 MG tablet Take 1 tablet (10 mg total) by mouth daily.   aspirin  EC 81 MG tablet Take 81 mg by mouth daily.   carvedilol  (COREG ) 6.25 MG tablet TAKE 1 TABLET BY MOUTH TWICE A DAY   cetirizine (ZYRTEC) 10 MG chewable  tablet 1 tablet Orally Once a day; Duration: 30 day(s)   cholecalciferol  (VITAMIN D ) 1000 units tablet Take 1,000 Units by mouth daily.   clopidogrel  (PLAVIX ) 75 MG tablet Take 75 mg by mouth daily.   ezetimibe  (ZETIA ) 10 MG tablet Take 10 mg by mouth every evening.    fish oil-omega-3 fatty acids 1000 MG capsule Take 1 g by mouth daily.    isosorbide  mononitrate (IMDUR ) 60 MG 24 hr tablet Take 1 tablet (60 mg total) by mouth daily.   nitroGLYCERIN  (NITROSTAT ) 0.4 MG SL tablet PLACE 1  TABLET UNDER THE TONGUE EVERY 5 MINUTES AS NEEDED FOR CHEST PAIN.   pravastatin  (PRAVACHOL ) 80 MG tablet Take 80 mg by mouth at bedtime.    ranolazine  (RANEXA ) 1000 MG SR tablet Take 1 tablet (1,000 mg total) by mouth 2 (two) times daily.   [DISCONTINUED] semaglutide-weight management (WEGOVY) 1 MG/0.5ML SOAJ SQ injection Inject 1 mg into the skin.   No facility-administered encounter medications on file as of 07/22/2024.    Allergies as of 07/22/2024 - Review Complete 07/22/2024  Allergen Reaction Noted   Atorvastatin  01/31/2023   Augmentin [amoxicillin-pot clavulanate] Other (See Comments) 04/25/2020   Rosuvastatin Other (See Comments) 01/31/2023    Past Medical History:  Diagnosis Date   CAD (coronary artery disease)    Dizziness    Dyslipidemia    Flu 06/2016   Had again 01/208 with PNA   Obesity    OSA (obstructive sleep apnea)    Systemic hypertension    Vasospastic angina     Past Surgical History:  Procedure Laterality Date   CARDIAC CATHETERIZATION  12/02/2002   <20% restenosis RCA   CARDIAC CATHETERIZATION  01/30/2010   20-30% in-stent restenosis,60-70% ostial stenosis   CORONARY ANGIOPLASTY WITH STENT PLACEMENT  11/13/2002   mid RCA   CORONARY ATHERECTOMY N/A 01/31/2023   Procedure: CORONARY ATHERECTOMY;  Surgeon: Jordan, Peter M, MD;  Location: [facility] INVASIVE CV LAB;  Service: Cardiovascular;  Laterality: N/A;   CORONARY CTO INTERVENTION N/A 01/31/2023   Procedure: CORONARY CTO INTERVENTION;  Surgeon: Jordan, Peter M, MD;  Location: [facility] INVASIVE CV LAB;  Service: Cardiovascular;  Laterality: N/A;   KNEE SURGERY  2003   LEFT HEART CATH AND CORONARY ANGIOGRAPHY N/A 09/05/2016   Procedure: Left Heart Cath and Coronary Angiography;  Surgeon: Lonni JONETTA Cash, MD;  Location: [facility] INVASIVE CV LAB;  Service: Cardiovascular;  Laterality: N/A;   LEFT HEART CATH AND CORONARY ANGIOGRAPHY N/A 01/30/2024   Procedure: LEFT HEART CATH AND CORONARY ANGIOGRAPHY;  Surgeon: Cash Lonni JONETTA, MD;  Location: [facility] INVASIVE CV LAB;  Service: Cardiovascular;  Laterality: N/A;   LEFT HEART CATHETERIZATION WITH CORONARY ANGIOGRAM N/A 12/22/2012   Procedure: LEFT HEART CATHETERIZATION WITH CORONARY ANGIOGRAM;  Surgeon: Jerel Balding, MD;  Location: [facility] CATH LAB;  Service: Cardiovascular;  Laterality: N/A;   NOSE SURGERY     TOTAL KNEE ARTHROPLASTY Left 03/22/2017   Procedure: LEFT TOTAL KNEE ARTHROPLASTY;  Surgeon: Gerome Charleston, MD;  Location: [facility] ORS;  Service: Orthopedics;  Laterality: Left;   VASECTOMY      Family History  Problem Relation Age of Onset   Heart failure Father    Hypertension Father    Heart failure Maternal Grandmother    Stroke Paternal Grandmother    Cancer Paternal Grandfather    Alzheimer's disease Paternal Grandfather     Social History   Socioeconomic History   Marital status: Married    Spouse name: Not on file  Number of children: 2   Years of education: 12th   Highest education level: Not on file  Occupational History   Occupation: Armed forces technical officer    Employer: [employer]   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   Patient is right handed.   Patient has 12th grade education.   Patient does not drink caffeine.   Social Drivers of Health   Tobacco Use: Low Risk (07/22/2024)   Patient History    Smoking Tobacco Use: Never    Smokeless Tobacco Use: Never    Passive Exposure: Not on file  Financial Resource Strain: Low Risk (01/31/2023)   Overall Financial Resource Strain (CARDIA)    Difficulty of Paying Living Expenses: Not hard at all   Food Insecurity: No Food Insecurity (01/31/2023)   Hunger Vital Sign    Worried About Running Out of Food in the Last Year: Never true    Ran Out of Food in the Last Year: Never true  Transportation Needs: No Transportation Needs (01/31/2023)   PRAPARE - Administrator, Civil Service (Medical): No    Lack of Transportation (Non-Medical): No  Physical Activity: Inactive (01/31/2023)   Exercise Vital Sign    Days of Exercise per Week: 0 days    Minutes of Exercise per Session: 0 min  Stress: No Stress Concern Present (12/28/2022)   Received from [facility] - Occupational Stress Questionnaire    Feeling of Stress : Not at all  Social Connections: Moderately Isolated (01/31/2023)   Social Connection and Isolation Panel    Frequency of Communication with Friends and Family: More than three times a week    Frequency of Social Gatherings with Friends and Family: More than three times a week    Attends Religious Services: Never    Database Administrator or Organizations: No    Attends Banker Meetings: Never    Marital Status: Married  Catering Manager Violence: Not At Risk (01/31/2023)   Humiliation, Afraid, Rape, and Kick questionnaire    Fear of Current or Ex-Partner: No    Emotionally Abused: No    Physically Abused: No    Sexually Abused: No  Depression (PHQ2-9): Medium Risk (07/04/2023)   Depression (PHQ2-9)    PHQ-2 Score: 7  Alcohol Screen: Low Risk (01/31/2023)   Alcohol Screen    Last Alcohol Screening Score (AUDIT): 0  Housing: Low Risk (01/31/2023)   Housing    Last Housing Risk Score: 0  Utilities: Not At Risk (01/31/2023)   AHC Utilities    Threatened with loss of utilities: No  Health Literacy: Adequate Health Literacy (01/31/2023)   B1300 Health Literacy    Frequency of need for help with medical instructions: Never    Review of Systems  Constitutional:  Positive for fatigue.  Respiratory:  Positive for chest  tightness and shortness of breath.   Psychiatric/Behavioral:  Positive for sleep disturbance.     Vitals:   07/22/24 0827  BP: 124/74  Pulse: 72  Temp: 98.2 F (36.8 C)  SpO2: 98%     Physical Exam Constitutional:      Appearance: He is obese.  HENT:     Head: Normocephalic.     Nose: Nose normal.     Mouth/Throat:     Mouth: Mucous membranes are moist.  Eyes:     General: No scleral icterus. Cardiovascular:     Rate and Rhythm: Normal rate and regular rhythm.     Heart sounds: No murmur heard.    No friction rub.  Pulmonary:     Effort: No respiratory distress.     Breath sounds: No stridor. No wheezing or rhonchi.  Musculoskeletal:        General: No swelling.     Cervical back: No rigidity or tenderness.  Skin:    Coloration: Skin is not jaundiced.  Neurological:     General: No focal deficit present.     Mental Status: He is alert.  Psychiatric:        Mood and Affect: Mood normal.    Epworth Sleepiness Scale of 11  Data Reviewed: Echocardiogram 06/15/2024 - Ejection fraction of 65 to 70%, grade 1 diastolic dysfunction normal right-sided structure, normal right ventricular systolic pressure-24.5  Recent chest x-rays that he had done he is not available to be reviewed  Cardiology notes reviewed  Assessment and Plan Assessment & Plan Obstructive sleep apnea Previous CPAP use discontinued due to nasal drainage issues. Oxygen levels as low as 66% during sleep study in 2022. Potential contribution to chronic dyspnea and overall health. Discussed the importance of addressing sleep apnea due to its impact on heart health and overall well-being. Consideration of revisiting CPAP therapy despite previous intolerance. - Ordered home sleep study to reassess severity of sleep apnea - Discussed potential for revisiting CPAP therapy if sleep apnea is significant - He does have excessive daytime sleepiness   Chronic dyspnea One Year, exacerbated since illness  post-Christmas. No smoking history, but past occupational exposure to potential lung irritants. Differential includes cardiac issues, lung disease, and sleep apnea. Previous treatments with prednisone and antibiotics were ineffective. No current wheezing or significant lung findings on examination. Discussed the potential role of cardiac issues and sleep apnea in contributing to dyspnea. Emphasized the importance of exercise to improve cardiovascular and respiratory function. - Ordered chest x-ray to evaluate lung structure - Ordered breathing study to assess lung function - Prescribed albuterol  inhaler for use as needed for shortness of breath - Encouraged regular exercise within tolerance to improve cardiovascular and respiratory function  Obesity Contributing to sleep apnea and potentially to dyspnea. Previous use of Wegovy resulted in significant gastrointestinal side effects, leading to discontinuation. Weight loss achieved but not sustained due to intolerance to medication. Discussed the importance of weight management through diet and exercise. Emphasized the need for gradual weight loss to improve overall health and reduce symptoms. - Encouraged dietary modifications and regular exercise to promote weight loss - Discussed potential for alternative weight management strategies if gastrointestinal side effects persist   I do believe his significant coronary artery disease may also be contributing to his limitations  Significant obesity may be contributing to limitations as well however unable to tolerate Wegovy  Hypertension - Continue management Hyperlipidemia - On statins  His coronary artery disease is being managed - Last cardiology visit -November noted    Orders Placed This Encounter  Procedures   DG Chest 2 View    Standing Status:   Future  Expiration Date:   07/22/2025    Reason for Exam (SYMPTOM  OR DIAGNOSIS REQUIRED):   sob    Preferred imaging location?:   Internal    Pulmonary Function Test    Standing Status:   Future    Expiration Date:   07/22/2025    Full PFT:   Yes   Home sleep test    Standing Status:   Future    Expiration Date:   07/22/2025    Where should this test be performed::   [facility] - Pulmonary    I personally spent a total of 45 minutes in the care of the patient today including preparing to see the patient, getting/reviewing separately obtained history, performing a medically appropriate exam/evaluation, counseling and educating, placing orders, documenting clinical information in the EHR, and independently interpreting results.   Jennet Epley MD [facility] 07/22/2024, 9:07 AM  CC: Clarice Nottingham, MD    "

## 2024-07-22 NOTE — Patient Instructions (Signed)
 Shortness of breath worse in the last couple of months  Likely related to heart problems as you do not have underlying lung disease that we know of  We will get a chest x-ray today We will schedule you for breathing study  We may need to get a CT scan of your chest if above does not explain why you are short of breath  We will try an inhaler-albuterol , can be used 2 puffs up to 4 times a day as needed  Graded exercises as tolerated, challenge yourself as tolerated  Follow-up in about 6 to 8 weeks  I will schedule you for a repeat sleep study, your previous study did show very severe disease, understandable that you did not tolerate it in the past, but not treating sleep apnea with very low oxygen levels will continue to put you in harm's way  Call us  with significant concerns

## 2024-08-06 ENCOUNTER — Ambulatory Visit: Admitting: Cardiovascular Disease

## 2024-08-06 ENCOUNTER — Encounter: Payer: Self-pay | Admitting: Cardiovascular Disease

## 2024-08-06 VITALS — BP 110/64 | HR 76 | Ht 73.0 in | Wt 297.0 lb

## 2024-08-06 DIAGNOSIS — G4733 Obstructive sleep apnea (adult) (pediatric): Secondary | ICD-10-CM

## 2024-08-06 DIAGNOSIS — R053 Chronic cough: Secondary | ICD-10-CM

## 2024-08-06 DIAGNOSIS — R0602 Shortness of breath: Secondary | ICD-10-CM | POA: Diagnosis not present

## 2024-08-06 DIAGNOSIS — I10 Essential (primary) hypertension: Secondary | ICD-10-CM | POA: Diagnosis not present

## 2024-08-06 DIAGNOSIS — I25118 Atherosclerotic heart disease of native coronary artery with other forms of angina pectoris: Secondary | ICD-10-CM

## 2024-08-06 DIAGNOSIS — E785 Hyperlipidemia, unspecified: Secondary | ICD-10-CM | POA: Diagnosis not present

## 2024-08-06 DIAGNOSIS — I358 Other nonrheumatic aortic valve disorders: Secondary | ICD-10-CM | POA: Diagnosis not present

## 2024-08-06 MED ORDER — BENZONATATE 100 MG PO CAPS: 100.0000 mg | ORAL_CAPSULE | Freq: Three times a day (TID) | ORAL | 1 refills | Status: AC | PRN

## 2024-08-06 MED ORDER — NITROGLYCERIN 0.4 MG SL SUBL: 0.4000 mg | SUBLINGUAL_TABLET | SUBLINGUAL | 3 refills | Status: AC | PRN

## 2024-08-06 NOTE — Progress Notes (Signed)
 Patient ID: Craig Copeland, male   DOB: 18-Dec-1955, 69 y.o.   MRN: [id number]      Cardiology Office Note   Date:  08/06/2024   ID:  Craig Copeland, Craig Copeland June 03, 1956, MRN [id number]  PCP:  Craig Nottingham, MD  Cardiologist:   Craig Balding, MD   Chief Complaint  Patient presents with   Cough     History of Present Illness: Craig Copeland is a 69 y.o. male who presents for follow-up for coronary artery disease, hypertension and hyperlipidemia.  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  He did receive a new drug-eluting stent to the proximal right coronary artery for a guy-provoked dissection at the time of the last unsuccessful revascularization attempt 01/31/2023.  Additional medical problems include aortic atherosclerosis, OSA, hypercholesterolemia, hypertension, severe obesity.  His biggest complaint right now is a incessant cough that has been bothering him since before Thanksgiving.  It sounds like it produces small quantities of mucus.  It has not been associated with fever or chills or chest pain.  When it first began he had had 5 days of incessant nausea and vomiting, which he thinks was related to treatment with [facility] and which she has subsequently stopped.  He has had a couple of chest x-ray since that have not shown evidence of pneumonia.  He has taken antibiotics and a prednisone Dosepak and has been prescribed bronchodilators, none of which have helped.  He often hears wheezing especially at night, but only if he lies on 1 side.  He does not wheeze if he is lying flat on his back.  He does not have overt orthopnea.  He has NYHA functional class II shortness of breath with activity, worsened by the cough.    He has not had dizziness, palpitations, syncope and has not had any chest pain in the last 2 months.  He is on 4 antianginal  medications including amlodipine  5 mg daily, carvedilol  6.25 mg twice daily, isosorbide  mononitrate 30 mg daily and ranolazine  at 1000 mg twice daily.  These have not been changed in a long time.      He has not been using CPAP for years due to sinus drainage.  He complains of fatigue and often has to take naps in the afternoon.  He has lost a little weight and is no longer morbidly obese but has a BMI of 39.  His most recent lipid profile from May 05, 2024 shows an excellent LDL cholesterol 54 but has always a rather low HDL at 41.  Triglycerides are normal.  He takes pravastatin  but was intolerant of rosuvastatin and atorvastatin.  A1c was 5.4%  Remains on chronic treatment with aspirin  and clopidogrel .  He has a history of previous stent to the right coronary artery(2004, drug-eluting 3.0 x 33 mm Cypher stent overlapping 3.0 x 23 mm Cypher stent in the mid to proximal right coronary artery) and a moderate to severe ostial lesion of a small to medium ramus intermedius artery (too small for percutaneous revascularization). No change on cardiac cath performed in 2018. He was suspected of having vasospastic angina, but he showed substantial improvement on treatment with Ranexa .  He has normal left ventricle systolic function by echo most recently performed at [facility] in July 2024(EF 60-65% with normal wall motion); nuclear study at [facility] 12/30/2022 showed inferior wall ischemia and EF 61%.SABRA  History of hypertension and hyperlipidemia. He is morbidly obese. He is not compliant with CPAP for obstructive sleep apnea. He is on chronic treatment with aspirin  and clopidogrel .    Past Medical History:  Diagnosis Date   CAD (coronary artery disease)    Dizziness    Dyslipidemia    Flu 06/2016   Had again 01/208 with PNA   Obesity    OSA (obstructive sleep apnea)    Systemic hypertension    Vasospastic angina     Past Surgical History:  Procedure Laterality Date   CARDIAC  CATHETERIZATION  12/02/2002   <20% restenosis RCA   CARDIAC CATHETERIZATION  01/30/2010   20-30% in-stent restenosis,60-70% ostial stenosis   CORONARY ANGIOPLASTY WITH STENT PLACEMENT  11/13/2002   mid RCA   CORONARY ATHERECTOMY N/A 01/31/2023   Procedure: CORONARY ATHERECTOMY;  Surgeon: Jordan, Peter M, MD;  Location: [facility] INVASIVE CV LAB;  Service: Cardiovascular;  Laterality: N/A;   CORONARY CTO INTERVENTION N/A 01/31/2023   Procedure: CORONARY CTO INTERVENTION;  Surgeon: Jordan, Peter M, MD;  Location: [facility] INVASIVE CV LAB;  Service: Cardiovascular;  Laterality: N/A;   KNEE SURGERY  2003   LEFT HEART CATH AND CORONARY ANGIOGRAPHY N/A 09/05/2016   Procedure: Left Heart Cath and Coronary Angiography;  Surgeon: Lonni JONETTA Cash, MD;  Location: [facility] INVASIVE CV LAB;  Service: Cardiovascular;  Laterality: N/A;   LEFT HEART CATH AND CORONARY ANGIOGRAPHY N/A 01/30/2024   Procedure: LEFT HEART CATH AND CORONARY ANGIOGRAPHY;  Surgeon: Cash Lonni JONETTA, MD;  Location: [facility] INVASIVE CV LAB;  Service: Cardiovascular;  Laterality: N/A;   LEFT HEART CATHETERIZATION WITH CORONARY ANGIOGRAM N/A 12/22/2012   Procedure: LEFT HEART CATHETERIZATION WITH CORONARY ANGIOGRAM;  Surgeon: Craig Balding, MD;  Location: [facility] CATH LAB;  Service: Cardiovascular;  Laterality: N/A;   NOSE SURGERY     TOTAL KNEE ARTHROPLASTY Left 03/22/2017   Procedure: LEFT TOTAL KNEE ARTHROPLASTY;  Surgeon: Gerome Charleston, MD;  Location: [facility] ORS;  Service: Orthopedics;  Laterality: Left;   VASECTOMY       Current Outpatient Medications  Medication Sig Dispense Refill   acetaminophen  (TYLENOL ) 500 MG tablet Take 500 mg by mouth as needed for moderate pain.     albuterol  (VENTOLIN  HFA) 108 (90 Base) MCG/ACT inhaler Inhale 2 puffs into the lungs every 6 (six) hours as needed for wheezing or shortness of breath. 8 g 6   amLODipine  (NORVASC ) 10 MG tablet Take 1 tablet (10 mg total) by mouth daily. 90 tablet 3   aspirin  EC 81 MG tablet Take 81 mg  by mouth daily.     benzonatate  (TESSALON ) 100 MG capsule Take 1 capsule (100 mg total) by mouth every 8 (eight) hours as needed for cough (every 8 hours as needed for cough). 30 capsule 1   carvedilol  (COREG ) 6.25 MG tablet TAKE 1 TABLET BY MOUTH TWICE A DAY 180 tablet 3   cetirizine (ZYRTEC) 10 MG chewable tablet 1 tablet Orally Once a day; Duration: 30 day(s)     cholecalciferol  (VITAMIN D ) 1000 units tablet Take 1,000 Units by mouth daily.     clopidogrel  (PLAVIX ) 75 MG tablet Take 75 mg by mouth daily.     ezetimibe  (ZETIA ) 10 MG tablet Take 10 mg by mouth every evening.      fish oil-omega-3 fatty acids 1000 MG capsule Take 1 g by mouth daily.      isosorbide  mononitrate (IMDUR ) 60 MG 24 hr tablet Take 1 tablet (60 mg total) by mouth daily. 90  tablet 3   pravastatin  (PRAVACHOL ) 80 MG tablet Take 80 mg by mouth at bedtime.      ranolazine  (RANEXA ) 1000 MG SR tablet Take 1 tablet (1,000 mg total) by mouth 2 (two) times daily. 60 tablet 0   nitroGLYCERIN  (NITROSTAT ) 0.4 MG SL tablet Place 1 tablet (0.4 mg total) under the tongue every 5 (five) minutes as needed for chest pain (every 5 minutes as needed for chest pain, do not exceed 3 doses in 15 minutes. If pain is not completely gone after 2-3 doses, call 911). 25 tablet 3   No current facility-administered medications for this visit.    Allergies:   Atorvastatin, Augmentin [amoxicillin-pot clavulanate], and Rosuvastatin    Social History:  The patient  reports that he has never smoked. He has never used smokeless tobacco. He reports current alcohol use of about 1.0 standard drink of alcohol per week. He reports that he does not use drugs.   Family History:  The patient's family history includes Alzheimer's disease in his paternal grandfather; Cancer in his paternal grandfather; Heart failure in his father and maternal grandmother; Hypertension in his father; Stroke in his paternal grandmother.    ROS:  Please see the history of present  illness.  He denies shortness of breath at rest or with activity, orthopnea, PND, claudication, focal neurological complaints.  All other systems are reviewed and are negative.  PHYSICAL EXAM: VS:  BP 110/64 (BP Location: Left Arm, Patient Position: Sitting, Cuff Size: Large)   Pulse 76   Ht 6' 1 (1.854 m)   Wt 297 lb (134.7 kg)   SpO2 97%   BMI 39.18 kg/m  , BMI Body mass index is 39.18 kg/m.   General: Alert, oriented x3, no distress, severely obese Head: no evidence of trauma, PERRL, EOMI, no exophtalmos or lid lag, no myxedema, no xanthelasma; normal ears, nose and oropharynx Neck: normal jugular venous pulsations and no hepatojugular reflux; brisk carotid pulses without delay and no carotid bruits Chest: He has bilateral rhonchi but I do not hear any wheezes, wet rales or pleural rubs. Cardiovascular: normal position and quality of the apical impulse, regular rhythm, normal first and second heart sounds, 2/6 early peaking aortic ejection murmur, no diastolic murmurs, rubs or gallops Abdomen: no tenderness or distention, no masses by palpation, no abnormal pulsatility or arterial bruits, normal bowel sounds, no hepatosplenomegaly Extremities: no clubbing, cyanosis or edema; 2+ radial, ulnar and brachial pulses bilaterally; 2+ right femoral, posterior tibial and dorsalis pedis pulses; 2+ left femoral, posterior tibial and dorsalis pedis pulses; no subclavian or femoral bruits Neurological: grossly nonfocal Psych: Normal mood and affect  EKG: Personally reviewed his most recent ECG dated 05/08/2024 which shows Sinus rhythm with first-degree AV block and old inferior Q waves.  EKG Interpretation Date/Time:    Ventricular Rate:    PR Interval:    QRS Duration:    QT Interval:    QTC Calculation:   R Axis:      Text Interpretation:          Recent Labs: 10/24/2020 Creatinine 0.94, ALT 54, glucose 114 Lipid Panel     Component Value Date/Time   CHOL 120 01/24/2024 1002    TRIG 124 01/24/2024 1002   HDL 37 (L) 01/24/2024 1002   CHOLHDL 3.2 01/24/2024 1002   CHOLHDL 3.0 01/31/2023 0253   VLDL 16 01/31/2023 0253   LDLCALC 61 01/24/2024 1002    February 20, 2018 Total cholesterol 117, HDL 32, triglycerides 108, calculated LDL  67.  9 08 2020 Total cholesterol 119, HDL 34, triglycerides 72 Creatinine 0.7, normal liver function test  04/26/2021 Cholesterol 122, HDL 33, LDL 70, triglycerides 93  88/94/7975 Cholesterol 110, HDL 38, LDL 51, triglycerides 114, hemoglobin A1c 5.7%  05/11/2024 Cholesterol 115, HDL 41, LDL 54, triglycerides 105, hemoglobin A1c 5.4%  Wt Readings from Last 3 Encounters:  08/06/24 297 lb (134.7 kg)  07/22/24 293 lb 6.4 oz (133.1 kg)  05/21/24 (!) 313 lb (142 kg)     ASSESSMENT AND PLAN:  1. Coronary artery disease of native artery of native heart with stable angina pectoris   2. Shortness of breath   3. Chronic cough   4. Dyslipidemia (high LDL; low HDL)   5. Essential hypertension   6. Severe obesity (BMI 35.0-39.9) with comorbidity (HCC)   7. OSA on CPAP   8. Aortic valve sclerosis       CAD s/p PCI RCA: Currently without angina pectoris in 2 months which is a long time for him.  Almost 2 years have passed since his last attempt at revascularization of the right coronary artery (Although the procedure was unsuccessful he did require placement of a new drug-eluting stent in the proximal vessel due to a guide related dissection, repeat coronary angiography in July 2025 showed no evidence of in-stent restenosis).  Antianginals seem to be working well for him now on a complex regimen that includes all 4 types of agents (carvedilol , amlodipine , ranolazine , isosorbide  mononitrate).  He did not tolerate higher beta-blocker doses be due to bradycardia. Suspected to have a component of vasospasm which is why his long-acting nitrates are prescribed twice daily.  He has had 2 unsuccessful attempts at revascularization of the right  coronary artery including by our CTO team.  He could undergo surgical revascularization, but this would be a rather radical approach for what is a low risk for coronary situation.  He does have bridging collaterals to the distal vessel.  He does not have significant stenoses in the left coronary system.  He has an excellent lipid profile.  He should continue on dual antiplatelet therapy due to the complexity of his coronary disease, but clopidogrel  is no longer critical, 2 years after last revascularization.   Dyspnea/cough: To me this sounds like a productive cough and he has rhonchi on physical exam.  Seems to be caused by bronchitis.  He has never had overt congestive heart failure despite his complex coronary problems.  Will check a BNP, but I would be surprised if his cough is related to his heart disease.  I am not sure why his cough has been lasting for so long and why it is so severe.  He is seeing Dr. Neda and workup is in progress.  Gave prescription for Tessalon  for symptom relief. Hyperlipidemia.  Continue pravastatin .  His LDL is excellent, less than 55.  His chronic HDL cholesterol will not improve without substantial weight loss. Hypertension: Well-controlled.  No change in medicines. Severe obesity: He has lost some weight and is no longer in morbidly obese range.  He was intolerant of GLP-1 agonist. OSA: He tells me that he has not been using CPAP for years since i he has too much nasal drainage.  This likely explains his daytime hypersomnolence. Aortic sclerosis:   He has a murmur of aortic valve sclerosis but did not have any evidence of stenosis on a recent echocardiogram in July 2024.   Informed Consent   Shared Decision Making/Informed Consent The risks [stroke (1  in 1000), death (1 in 1000), kidney failure [usually temporary] (1 in 500), bleeding (1 in 200), allergic reaction [possibly serious] (1 in 200)], benefits (diagnostic support and management of coronary artery disease)  and alternatives of a cardiac catheterization were discussed in detail with Mr. Knauff and he is willing to proceed.      Patient Instructions  Medication Instructions:  Tessalon  100 mg every 8 hours as needed for cough *If you need a refill on your cardiac medications before your next appointment, please call your pharmacy*  Lab Work: BNP If you have labs (blood work) drawn today and your tests are completely normal, you will receive your results only by: MyChart Message (if you have MyChart) OR A paper copy in the mail If you have any lab test that is abnormal or we need to change your treatment, we will call you to review the results.  Testing/Procedures: None ordered  Follow-Up: At [facility], you and your health needs are our priority.  As part of our continuing mission to provide you with exceptional heart care, our providers are all part of one team.  This team includes your primary Cardiologist (physician) and Advanced Practice Providers or APPs (Physician Assistants and Nurse Practitioners) who all work together to provide you with the care you need, when you need it.  Your next appointment:   6 month(s)  Provider:   Jerel Balding, MD    We recommend signing up for the patient portal called MyChart.  Sign up information is provided on this After Visit Summary.  MyChart is used to connect with patients for Virtual Visits (Telemedicine).  Patients are able to view lab/test results, encounter notes, upcoming appointments, etc.  Non-urgent messages can be sent to your provider as well.   To learn more about what you can do with MyChart, go to [email or web address].          Signed, Craig Balding, MD  08/06/2024 12:28 PM    Craig Balding, MD, [facility] [phone] office [phone] pager

## 2024-08-06 NOTE — Patient Instructions (Signed)
 Medication Instructions:  Tessalon  100 mg every 8 hours as needed for cough *If you need a refill on your cardiac medications before your next appointment, please call your pharmacy*  Lab Work: BNP If you have labs (blood work) drawn today and your tests are completely normal, you will receive your results only by: MyChart Message (if you have MyChart) OR A paper copy in the mail If you have any lab test that is abnormal or we need to change your treatment, we will call you to review the results.  Testing/Procedures: None ordered  Follow-Up: At [facility], you and your health needs are our priority.  As part of our continuing mission to provide you with exceptional heart care, our providers are all part of one team.  This team includes your primary Cardiologist (physician) and Advanced Practice Providers or APPs (Physician Assistants and Nurse Practitioners) who all work together to provide you with the care you need, when you need it.  Your next appointment:   6 month(s)  Provider:   Jerel Balding, MD    We recommend signing up for the patient portal called MyChart.  Sign up information is provided on this After Visit Summary.  MyChart is used to connect with patients for Virtual Visits (Telemedicine).  Patients are able to view lab/test results, encounter notes, upcoming appointments, etc.  Non-urgent messages can be sent to your provider as well.   To learn more about what you can do with MyChart, go to [email or web address].

## 2024-08-07 LAB — BRAIN NATRIURETIC PEPTIDE: BNP: 50 pg/mL (ref 0.0–100.0)

## 2024-08-21 ENCOUNTER — Ambulatory Visit: Admitting: Family Medicine

## 2024-09-09 ENCOUNTER — Encounter

## 2024-09-09 ENCOUNTER — Ambulatory Visit: Admitting: Pulmonary Disease
